# Patient Record
Sex: Female | Born: 1985
Health system: Southern US, Community
[De-identification: ages and names within clinical notes are randomized; demographics above are authoritative.]

## PROBLEM LIST (undated history)

## (undated) DIAGNOSIS — F419 Anxiety disorder, unspecified: Secondary | ICD-10-CM

## (undated) DIAGNOSIS — R600 Localized edema: Secondary | ICD-10-CM

## (undated) DIAGNOSIS — N9489 Other specified conditions associated with female genital organs and menstrual cycle: Secondary | ICD-10-CM

## (undated) DIAGNOSIS — M51379 Other intervertebral disc degeneration, lumbosacral region without mention of lumbar back pain or lower extremity pain: Secondary | ICD-10-CM

## (undated) DIAGNOSIS — I509 Heart failure, unspecified: Secondary | ICD-10-CM

## (undated) DIAGNOSIS — F411 Generalized anxiety disorder: Secondary | ICD-10-CM

## (undated) DIAGNOSIS — G894 Chronic pain syndrome: Secondary | ICD-10-CM

## (undated) DIAGNOSIS — M3507 Sjogren syndrome with central nervous system involvement: Secondary | ICD-10-CM

## (undated) DIAGNOSIS — M5030 Other cervical disc degeneration, unspecified cervical region: Secondary | ICD-10-CM

## (undated) DIAGNOSIS — N939 Abnormal uterine and vaginal bleeding, unspecified: Principal | ICD-10-CM

## (undated) DIAGNOSIS — I499 Cardiac arrhythmia, unspecified: Secondary | ICD-10-CM

## (undated) DIAGNOSIS — F329 Major depressive disorder, single episode, unspecified: Secondary | ICD-10-CM

## (undated) DIAGNOSIS — I774 Celiac artery compression syndrome: Secondary | ICD-10-CM

## (undated) DIAGNOSIS — I739 Peripheral vascular disease, unspecified: Secondary | ICD-10-CM

## (undated) DIAGNOSIS — D500 Iron deficiency anemia secondary to blood loss (chronic): Secondary | ICD-10-CM

## (undated) DIAGNOSIS — Z7901 Long term (current) use of anticoagulants: Secondary | ICD-10-CM

## (undated) DIAGNOSIS — G43019 Migraine without aura, intractable, without status migrainosus: Secondary | ICD-10-CM

## (undated) DIAGNOSIS — Z86718 Personal history of other venous thrombosis and embolism: Secondary | ICD-10-CM

## (undated) DIAGNOSIS — E039 Hypothyroidism, unspecified: Secondary | ICD-10-CM

## (undated) DIAGNOSIS — F32A Depression, unspecified: Secondary | ICD-10-CM

## (undated) HISTORY — DX: Anxiety disorder, unspecified: F41.9

## (undated) HISTORY — PX: CRANIECTOMY SUBOCCIPITAL W/ CERVICAL LAMINECTOMY / CHIARI: SUR327

## (undated) HISTORY — PX: HIATAL HERNIA REPAIR: SHX195

## (undated) HISTORY — PX: CARDIAC CATHETERIZATION: SHX172

## (undated) HISTORY — DX: Depression, unspecified: F32.A

---

## 2011-02-23 DIAGNOSIS — G5603 Carpal tunnel syndrome, bilateral upper limbs: Secondary | ICD-10-CM

## 2011-02-23 HISTORY — DX: Carpal tunnel syndrome, bilateral upper limbs: G56.03

## 2012-04-04 DIAGNOSIS — F330 Major depressive disorder, recurrent, mild: Secondary | ICD-10-CM | POA: Insufficient documentation

## 2012-04-04 HISTORY — DX: Major depressive disorder, recurrent, mild: F33.0

## 2012-04-10 DIAGNOSIS — L720 Epidermal cyst: Secondary | ICD-10-CM

## 2012-04-10 DIAGNOSIS — G43009 Migraine without aura, not intractable, without status migrainosus: Secondary | ICD-10-CM

## 2012-04-10 HISTORY — DX: Migraine without aura, not intractable, without status migrainosus: G43.009

## 2012-04-10 HISTORY — DX: Epidermal cyst: L72.0

## 2013-04-24 DIAGNOSIS — H5710 Ocular pain, unspecified eye: Secondary | ICD-10-CM | POA: Insufficient documentation

## 2013-04-24 DIAGNOSIS — H539 Unspecified visual disturbance: Secondary | ICD-10-CM | POA: Insufficient documentation

## 2013-04-24 DIAGNOSIS — H5213 Myopia, bilateral: Secondary | ICD-10-CM | POA: Insufficient documentation

## 2013-04-24 HISTORY — DX: Myopia, bilateral: H52.13

## 2013-04-24 HISTORY — DX: Ocular pain, unspecified eye: H57.10

## 2013-04-24 HISTORY — DX: Unspecified visual disturbance: H53.9

## 2013-04-28 DIAGNOSIS — G935 Compression of brain: Secondary | ICD-10-CM

## 2013-04-28 HISTORY — DX: Compression of brain: G93.5

## 2013-09-17 DIAGNOSIS — G5610 Other lesions of median nerve, unspecified upper limb: Secondary | ICD-10-CM | POA: Insufficient documentation

## 2013-09-17 DIAGNOSIS — M542 Cervicalgia: Secondary | ICD-10-CM | POA: Insufficient documentation

## 2013-09-17 DIAGNOSIS — M5412 Radiculopathy, cervical region: Secondary | ICD-10-CM | POA: Insufficient documentation

## 2013-09-17 DIAGNOSIS — M5416 Radiculopathy, lumbar region: Secondary | ICD-10-CM | POA: Insufficient documentation

## 2013-09-17 HISTORY — DX: Radiculopathy, cervical region: M54.12

## 2013-09-17 HISTORY — DX: Other lesions of median nerve, unspecified upper limb: G56.10

## 2013-09-17 HISTORY — DX: Radiculopathy, lumbar region: M54.16

## 2013-09-17 HISTORY — DX: Cervicalgia: M54.2

## 2014-05-12 DIAGNOSIS — Z98891 History of uterine scar from previous surgery: Secondary | ICD-10-CM | POA: Insufficient documentation

## 2014-05-12 HISTORY — DX: History of uterine scar from previous surgery: Z98.891

## 2014-05-23 DIAGNOSIS — M47812 Spondylosis without myelopathy or radiculopathy, cervical region: Secondary | ICD-10-CM | POA: Insufficient documentation

## 2014-05-23 DIAGNOSIS — G3184 Mild cognitive impairment, so stated: Secondary | ICD-10-CM | POA: Insufficient documentation

## 2014-05-23 HISTORY — DX: Mild cognitive impairment of uncertain or unknown etiology: G31.84

## 2014-05-23 HISTORY — DX: Spondylosis without myelopathy or radiculopathy, cervical region: M47.812

## 2014-11-05 DIAGNOSIS — R509 Fever, unspecified: Secondary | ICD-10-CM | POA: Insufficient documentation

## 2014-11-05 HISTORY — DX: Fever, unspecified: R50.9

## 2018-02-21 DIAGNOSIS — J209 Acute bronchitis, unspecified: Secondary | ICD-10-CM | POA: Diagnosis not present

## 2018-03-31 DIAGNOSIS — Z20828 Contact with and (suspected) exposure to other viral communicable diseases: Secondary | ICD-10-CM | POA: Diagnosis not present

## 2018-03-31 DIAGNOSIS — R05 Cough: Secondary | ICD-10-CM | POA: Diagnosis not present

## 2018-03-31 DIAGNOSIS — R0602 Shortness of breath: Secondary | ICD-10-CM | POA: Diagnosis not present

## 2018-03-31 DIAGNOSIS — R509 Fever, unspecified: Secondary | ICD-10-CM | POA: Diagnosis not present

## 2018-05-02 DIAGNOSIS — Z79899 Other long term (current) drug therapy: Secondary | ICD-10-CM | POA: Diagnosis not present

## 2018-05-02 DIAGNOSIS — G5603 Carpal tunnel syndrome, bilateral upper limbs: Secondary | ICD-10-CM | POA: Diagnosis not present

## 2018-05-02 DIAGNOSIS — G589 Mononeuropathy, unspecified: Secondary | ICD-10-CM | POA: Diagnosis not present

## 2018-05-02 DIAGNOSIS — G43719 Chronic migraine without aura, intractable, without status migrainosus: Secondary | ICD-10-CM | POA: Diagnosis not present

## 2018-05-02 DIAGNOSIS — M542 Cervicalgia: Secondary | ICD-10-CM | POA: Diagnosis not present

## 2018-05-02 DIAGNOSIS — R202 Paresthesia of skin: Secondary | ICD-10-CM | POA: Diagnosis not present

## 2018-05-16 DIAGNOSIS — Z1159 Encounter for screening for other viral diseases: Secondary | ICD-10-CM | POA: Diagnosis not present

## 2018-06-11 DIAGNOSIS — E039 Hypothyroidism, unspecified: Secondary | ICD-10-CM | POA: Diagnosis not present

## 2018-06-11 DIAGNOSIS — F339 Major depressive disorder, recurrent, unspecified: Secondary | ICD-10-CM | POA: Diagnosis not present

## 2018-06-11 DIAGNOSIS — F419 Anxiety disorder, unspecified: Secondary | ICD-10-CM | POA: Diagnosis not present

## 2018-06-11 DIAGNOSIS — B373 Candidiasis of vulva and vagina: Secondary | ICD-10-CM | POA: Diagnosis not present

## 2018-07-22 DIAGNOSIS — M5412 Radiculopathy, cervical region: Secondary | ICD-10-CM | POA: Diagnosis not present

## 2018-07-22 DIAGNOSIS — G5603 Carpal tunnel syndrome, bilateral upper limbs: Secondary | ICD-10-CM | POA: Diagnosis not present

## 2018-07-22 DIAGNOSIS — G43719 Chronic migraine without aura, intractable, without status migrainosus: Secondary | ICD-10-CM | POA: Diagnosis not present

## 2018-07-22 DIAGNOSIS — M5417 Radiculopathy, lumbosacral region: Secondary | ICD-10-CM | POA: Diagnosis not present

## 2018-09-19 DIAGNOSIS — E039 Hypothyroidism, unspecified: Secondary | ICD-10-CM | POA: Diagnosis not present

## 2018-09-19 DIAGNOSIS — F339 Major depressive disorder, recurrent, unspecified: Secondary | ICD-10-CM | POA: Diagnosis not present

## 2018-09-19 DIAGNOSIS — F419 Anxiety disorder, unspecified: Secondary | ICD-10-CM | POA: Diagnosis not present

## 2019-01-12 DIAGNOSIS — N83202 Unspecified ovarian cyst, left side: Secondary | ICD-10-CM | POA: Diagnosis not present

## 2019-01-12 DIAGNOSIS — R109 Unspecified abdominal pain: Secondary | ICD-10-CM | POA: Diagnosis not present

## 2019-01-12 DIAGNOSIS — Z880 Allergy status to penicillin: Secondary | ICD-10-CM | POA: Diagnosis not present

## 2019-01-12 DIAGNOSIS — R110 Nausea: Secondary | ICD-10-CM | POA: Diagnosis not present

## 2019-01-12 DIAGNOSIS — Z79899 Other long term (current) drug therapy: Secondary | ICD-10-CM | POA: Diagnosis not present

## 2019-01-12 DIAGNOSIS — Z9889 Other specified postprocedural states: Secondary | ICD-10-CM | POA: Diagnosis not present

## 2019-01-12 DIAGNOSIS — N200 Calculus of kidney: Secondary | ICD-10-CM | POA: Diagnosis not present

## 2019-01-12 DIAGNOSIS — R630 Anorexia: Secondary | ICD-10-CM | POA: Diagnosis not present

## 2019-01-12 DIAGNOSIS — R1084 Generalized abdominal pain: Secondary | ICD-10-CM | POA: Diagnosis not present

## 2019-01-12 DIAGNOSIS — R1032 Left lower quadrant pain: Secondary | ICD-10-CM | POA: Diagnosis not present

## 2019-01-15 DIAGNOSIS — E039 Hypothyroidism, unspecified: Secondary | ICD-10-CM | POA: Diagnosis not present

## 2019-01-15 DIAGNOSIS — F339 Major depressive disorder, recurrent, unspecified: Secondary | ICD-10-CM | POA: Diagnosis not present

## 2019-01-15 DIAGNOSIS — G4700 Insomnia, unspecified: Secondary | ICD-10-CM | POA: Diagnosis not present

## 2019-01-15 DIAGNOSIS — F419 Anxiety disorder, unspecified: Secondary | ICD-10-CM | POA: Diagnosis not present

## 2019-02-10 DIAGNOSIS — Z01419 Encounter for gynecological examination (general) (routine) without abnormal findings: Secondary | ICD-10-CM | POA: Diagnosis not present

## 2019-02-10 DIAGNOSIS — Z6822 Body mass index (BMI) 22.0-22.9, adult: Secondary | ICD-10-CM | POA: Diagnosis not present

## 2019-03-10 DIAGNOSIS — N83202 Unspecified ovarian cyst, left side: Secondary | ICD-10-CM | POA: Diagnosis not present

## 2019-04-08 DIAGNOSIS — M792 Neuralgia and neuritis, unspecified: Secondary | ICD-10-CM | POA: Diagnosis not present

## 2019-04-08 DIAGNOSIS — Z79899 Other long term (current) drug therapy: Secondary | ICD-10-CM | POA: Diagnosis not present

## 2019-04-08 DIAGNOSIS — M542 Cervicalgia: Secondary | ICD-10-CM | POA: Diagnosis not present

## 2019-04-08 DIAGNOSIS — M545 Low back pain: Secondary | ICD-10-CM | POA: Diagnosis not present

## 2019-04-08 DIAGNOSIS — G43719 Chronic migraine without aura, intractable, without status migrainosus: Secondary | ICD-10-CM | POA: Diagnosis not present

## 2019-04-24 DIAGNOSIS — R5383 Other fatigue: Secondary | ICD-10-CM | POA: Diagnosis not present

## 2019-04-24 DIAGNOSIS — F339 Major depressive disorder, recurrent, unspecified: Secondary | ICD-10-CM | POA: Diagnosis not present

## 2019-04-24 DIAGNOSIS — F419 Anxiety disorder, unspecified: Secondary | ICD-10-CM | POA: Diagnosis not present

## 2019-04-24 DIAGNOSIS — M3500 Sicca syndrome, unspecified: Secondary | ICD-10-CM | POA: Diagnosis not present

## 2019-04-24 DIAGNOSIS — G4700 Insomnia, unspecified: Secondary | ICD-10-CM | POA: Diagnosis not present

## 2019-04-24 DIAGNOSIS — E039 Hypothyroidism, unspecified: Secondary | ICD-10-CM | POA: Diagnosis not present

## 2019-04-24 DIAGNOSIS — F330 Major depressive disorder, recurrent, mild: Secondary | ICD-10-CM | POA: Diagnosis not present

## 2019-04-30 DIAGNOSIS — R42 Dizziness and giddiness: Secondary | ICD-10-CM | POA: Diagnosis not present

## 2019-05-27 DIAGNOSIS — N938 Other specified abnormal uterine and vaginal bleeding: Secondary | ICD-10-CM | POA: Diagnosis not present

## 2019-05-29 DIAGNOSIS — R109 Unspecified abdominal pain: Secondary | ICD-10-CM | POA: Diagnosis not present

## 2019-05-29 DIAGNOSIS — R1011 Right upper quadrant pain: Secondary | ICD-10-CM | POA: Diagnosis not present

## 2019-05-29 DIAGNOSIS — R319 Hematuria, unspecified: Secondary | ICD-10-CM | POA: Diagnosis not present

## 2019-05-29 DIAGNOSIS — R310 Gross hematuria: Secondary | ICD-10-CM | POA: Diagnosis not present

## 2019-05-29 DIAGNOSIS — R1031 Right lower quadrant pain: Secondary | ICD-10-CM | POA: Diagnosis not present

## 2019-05-29 DIAGNOSIS — Z79899 Other long term (current) drug therapy: Secondary | ICD-10-CM | POA: Diagnosis not present

## 2019-06-05 DIAGNOSIS — Z880 Allergy status to penicillin: Secondary | ICD-10-CM | POA: Diagnosis not present

## 2019-06-05 DIAGNOSIS — Z79899 Other long term (current) drug therapy: Secondary | ICD-10-CM | POA: Diagnosis not present

## 2019-06-05 DIAGNOSIS — R0602 Shortness of breath: Secondary | ICD-10-CM | POA: Diagnosis not present

## 2019-06-05 DIAGNOSIS — R0789 Other chest pain: Secondary | ICD-10-CM | POA: Diagnosis not present

## 2019-06-05 DIAGNOSIS — R000 Tachycardia, unspecified: Secondary | ICD-10-CM | POA: Diagnosis not present

## 2019-06-05 DIAGNOSIS — M79604 Pain in right leg: Secondary | ICD-10-CM | POA: Diagnosis not present

## 2019-06-05 DIAGNOSIS — R079 Chest pain, unspecified: Secondary | ICD-10-CM | POA: Diagnosis not present

## 2019-06-08 DIAGNOSIS — M545 Low back pain: Secondary | ICD-10-CM | POA: Diagnosis not present

## 2019-06-08 DIAGNOSIS — R079 Chest pain, unspecified: Secondary | ICD-10-CM | POA: Diagnosis not present

## 2019-06-25 DIAGNOSIS — I10 Essential (primary) hypertension: Secondary | ICD-10-CM | POA: Diagnosis not present

## 2019-07-01 DIAGNOSIS — K769 Liver disease, unspecified: Secondary | ICD-10-CM | POA: Diagnosis not present

## 2019-07-01 DIAGNOSIS — N3289 Other specified disorders of bladder: Secondary | ICD-10-CM | POA: Diagnosis not present

## 2019-07-01 DIAGNOSIS — K5900 Constipation, unspecified: Secondary | ICD-10-CM | POA: Diagnosis not present

## 2019-07-01 DIAGNOSIS — N888 Other specified noninflammatory disorders of cervix uteri: Secondary | ICD-10-CM | POA: Diagnosis not present

## 2019-07-01 DIAGNOSIS — R310 Gross hematuria: Secondary | ICD-10-CM | POA: Diagnosis not present

## 2019-07-07 DIAGNOSIS — R072 Precordial pain: Secondary | ICD-10-CM | POA: Diagnosis not present

## 2019-07-20 DIAGNOSIS — Z03818 Encounter for observation for suspected exposure to other biological agents ruled out: Secondary | ICD-10-CM | POA: Diagnosis not present

## 2019-07-20 DIAGNOSIS — Z20822 Contact with and (suspected) exposure to covid-19: Secondary | ICD-10-CM | POA: Diagnosis not present

## 2019-07-21 DIAGNOSIS — R0609 Other forms of dyspnea: Secondary | ICD-10-CM | POA: Diagnosis not present

## 2019-07-21 DIAGNOSIS — R05 Cough: Secondary | ICD-10-CM | POA: Diagnosis not present

## 2019-07-22 DIAGNOSIS — J4540 Moderate persistent asthma, uncomplicated: Secondary | ICD-10-CM | POA: Diagnosis not present

## 2019-07-22 DIAGNOSIS — E039 Hypothyroidism, unspecified: Secondary | ICD-10-CM | POA: Diagnosis not present

## 2019-07-29 DIAGNOSIS — R072 Precordial pain: Secondary | ICD-10-CM | POA: Diagnosis not present

## 2019-08-03 ENCOUNTER — Encounter: Payer: Self-pay | Admitting: Medical-Surgical

## 2019-08-03 ENCOUNTER — Ambulatory Visit (INDEPENDENT_AMBULATORY_CARE_PROVIDER_SITE_OTHER): Payer: BC Managed Care – PPO | Admitting: Medical-Surgical

## 2019-08-03 ENCOUNTER — Other Ambulatory Visit: Payer: Self-pay

## 2019-08-03 VITALS — BP 114/82 | HR 81 | Temp 98.2°F | Resp 16 | Ht 60.5 in | Wt 129.8 lb

## 2019-08-03 DIAGNOSIS — R05 Cough: Secondary | ICD-10-CM | POA: Diagnosis not present

## 2019-08-03 DIAGNOSIS — F418 Other specified anxiety disorders: Secondary | ICD-10-CM | POA: Insufficient documentation

## 2019-08-03 DIAGNOSIS — E039 Hypothyroidism, unspecified: Secondary | ICD-10-CM | POA: Insufficient documentation

## 2019-08-03 DIAGNOSIS — R058 Other specified cough: Secondary | ICD-10-CM

## 2019-08-03 DIAGNOSIS — Z7689 Persons encountering health services in other specified circumstances: Secondary | ICD-10-CM

## 2019-08-03 HISTORY — DX: Other specified anxiety disorders: F41.8

## 2019-08-03 HISTORY — DX: Other specified cough: R05.8

## 2019-08-03 NOTE — Progress Notes (Signed)
New Patient Office Visit  Subjective:  Patient ID: Yesenia Harper, female    DOB: 11-May-1985  Age: 34 y.o. MRN: [id number]  CC:  Chief Complaint  Patient presents with  . New Patient (Initial Visit)    HPI Yesenia Harper is a 34 year old female who presents to establish care. She has had quite a journey over the past year with multiple issues. Most recently she has been under the care of Cardiology, GI, Rheumatology, Neurology, and OB/GYN. Notes that she was found to have hematuria which prompted a CT Urogram. The urogram identified a lesion on her liver as well as ovarian cysts. She has been experiencing heart flutters, SOB, intermittent BLE edema, unexplained weight gain. She also has been struggling with fatigue, hair loss, easy bruising, knots in her legs, cold intolerance, dry eyes, and dry mouth. She has been evaluated and was negative for lupus. She was told she has fibromyalgia but doesn't feel that this is the cause of her symptoms. Has been on levothyroxine for approximately 1.5 years and notes that she has never felt an improvement in her symptoms. Recent thyroid labs normal for both TSH and free T4. Recently had an upper respiratory illness that lasted for 2 weeks. She still has a dry cough and reports her previous PCP told her she has asthma then prescribed a round of Prednisone and 2 inhalers.   Migraines- uses Nurtec as needed with good results and minimal side effects.  Anxiety/depression- taking Wellbutrin, tolerating well without side effects. Has tried Lexapro and other SSRIs in the past with too many side effects. Has Xanax prescribed TID as needed but only takes it at night. Has done counseling in 2013-04-29 when her nephew passed away but is not currently doing therapy.  History reviewed. No pertinent past medical history.  History reviewed. No pertinent surgical history.  History reviewed. No pertinent family history.  Social History   Socioeconomic History  . Marital  status: Married    Spouse name: Not on file  . Number of children: Not on file  . Years of education: Not on file  . Highest education level: Not on file  Occupational History  . Not on file  Tobacco Use  . Smoking status: Not on file  Substance and Sexual Activity  . Alcohol use: Not Currently  . Drug use: Never  . Sexual activity: Not on file  Other Topics Concern  . Not on file  Social History Narrative  . Not on file   Social Determinants of Health   Financial Resource Strain:   . Difficulty of Paying Living Expenses:   Food Insecurity:   . Worried About Programme researcher, broadcasting/film/video in the Last Year:   . Barista in the Last Year:   Transportation Needs:   . Freight forwarder (Medical):   Marland Kitchen Lack of Transportation (Non-Medical):   Physical Activity:   . Days of Exercise per Week:   . Minutes of Exercise per Session:   Stress:   . Feeling of Stress :   Social Connections:   . Frequency of Communication with Friends and Family:   . Frequency of Social Gatherings with Friends and Family:   . Attends Religious Services:   . Active Member of Clubs or Organizations:   . Attends Banker Meetings:   Marland Kitchen Marital Status:   Intimate Partner Violence:   . Fear of Current or Ex-Partner:   . Emotionally Abused:   Marland Kitchen Physically Abused:   . Sexually  Abused:     ROS Review of Systems  Constitutional: Positive for fatigue. Negative for chills and fever.  Eyes: Positive for photophobia.       Dry eyes  Respiratory: Positive for cough (dry), chest tightness and shortness of breath. Negative for wheezing.   Cardiovascular: Positive for chest pain, palpitations and leg swelling (intermittent).  Gastrointestinal: Positive for abdominal pain, constipation and nausea. Negative for blood in stool, diarrhea and vomiting.  Endocrine: Positive for cold intolerance.  Genitourinary: Negative for dysuria, frequency, urgency, vaginal bleeding and vaginal discharge.        Sometimes emptying bladder is difficult  Musculoskeletal: Positive for arthralgias (generalized), myalgias (generalized) and neck pain.  Allergic/Immunologic: Negative for environmental allergies and food allergies.  Neurological: Positive for dizziness, light-headedness and headaches (migraines without aura).  Hematological: Bruises/bleeds easily.  Psychiatric/Behavioral: Positive for dysphoric mood. Negative for self-injury, sleep disturbance and suicidal ideas. The patient is nervous/anxious.     Objective:   Today's Vitals: BP 114/82   Pulse 81   Temp 98.2 F (36.8 C) (Oral)   Resp 16   Ht 5' 0.5" (1.537 m)   Wt 129 lb 12.8 oz (58.9 kg)   BMI 24.93 kg/m   Physical Exam Vitals reviewed.  Constitutional:      General: She is not in acute distress.    Appearance: Normal appearance.  HENT:     Head: Normocephalic and atraumatic.  Cardiovascular:     Rate and Rhythm: Normal rate and regular rhythm.     Pulses: Normal pulses.     Heart sounds: Normal heart sounds. No murmur heard.  No friction rub. No gallop.   Pulmonary:     Effort: Pulmonary effort is normal. No respiratory distress.     Breath sounds: Normal breath sounds. No wheezing.  Skin:    General: Skin is warm and dry.  Neurological:     Mental Status: She is alert and oriented to person, place, and time.  Psychiatric:        Mood and Affect: Mood normal.        Behavior: Behavior normal.        Thought Content: Thought content normal.        Judgment: Judgment normal.     Assessment & Plan:   1. Encounter to establish care Reviewed available records and discussed health concerns. Currently under the care of multiple specialists for her various symptoms and concerns.   2. Hypothyroidism, unspecified type Most recent numbers for TSH and free T4 within normal limits but she is still symptomatic. Given that she reports she has never felt a reduction in symptoms since starting the levothyroxine, referring to  endocrinology for further evaluation. - Ambulatory referral to Endocrinology  3. Dry cough Patient does not feel that her prior PCP's diagnosis of asthma is correct. Discussed possible causes of a chronic dry cough. If patient would like to return for spirometry testing, this would be helpful in ruling out asthma. She was encouraged to schedule this at her convenience.  4. Anxiety with depression Continue Wellbutrin as prescribed. Continue sparing use of Xanax.  Consider panic attacks as possible cause of chest tightness, shortness of breath, abdominal pain, nausea, dizziness, and palpitations. Advised patient to monitor her mental status the next time one of these episodes occurs to see if she is feeling anxious or excessively stressed. May benefit from counseling to help manage her anxiety symptoms. Should she like, I will be glad to enter a referral to get her  connected to behavioral health.  Outpatient Encounter Medications as of 08/03/2019  Medication Sig  . chlorpheniramine-HYDROcodone (TUSSIONEX) 10-8 MG/5ML SUER Take by mouth.  . dicyclomine (BENTYL) 10 MG capsule One po AC  . estradiol (ESTRACE) 1 MG tablet Take by mouth.  Marland Kitchen albuterol (VENTOLIN HFA) 108 (90 Base) MCG/ACT inhaler Inhale into the lungs as needed.  . ALPRAZolam (XANAX) 0.5 MG tablet Take 0.5 mg by mouth 3 (three) times daily as needed.  Marland Kitchen buPROPion (WELLBUTRIN XL) 150 MG 24 hr tablet Take 450 mg by mouth daily.  . cyanocobalamin (,VITAMIN B-12,) 1000 MCG/ML injection Inject 1,000 mg into the muscle every 30 (thirty) days.  Marland Kitchen etonogestrel (NEXPLANON) 68 MG IMPL implant Inject into the skin.  Marland Kitchen levothyroxine (SYNTHROID) 75 MCG tablet Take 75 mcg by mouth daily.  Marland Kitchen LINZESS 290 MCG CAPS capsule Take 1 capsule by mouth.  . metoprolol succinate (TOPROL-XL) 25 MG 24 hr tablet Take 1 tablet by mouth as needed.  . Multiple Vitamins-Minerals (WOMENS MULTI VITAMIN & MINERAL) TABS Take 1 tablet by mouth daily.  . NURTEC 75 MG TBDP  Take 1 tablet by mouth daily as needed.  . predniSONE (DELTASONE) 20 MG tablet Take 20 mg by mouth daily.  . promethazine (PHENERGAN) 25 MG tablet Take 25 mg by mouth every 6 (six) hours as needed.  . topiramate (TOPAMAX) 200 MG tablet Take 200 mg by mouth 2 (two) times daily.  . [DISCONTINUED] TRULANCE 3 MG TABS TAKE ONE TABLET (3 MG DOSE) BY MOUTH DAILY.   No facility-administered encounter medications on file as of 08/03/2019.    Follow-up: Return in about 3 months (around 11/03/2019) for anxiety, chronic health concern follow up.   Thayer Ohm, DNP, APRN, FNP-BC [facility] and [facility]

## 2019-08-12 ENCOUNTER — Ambulatory Visit: Payer: BC Managed Care – PPO | Admitting: Internal Medicine

## 2019-08-17 ENCOUNTER — Encounter: Payer: Self-pay | Admitting: Medical-Surgical

## 2019-08-17 MED ORDER — FLUCONAZOLE 150 MG PO TABS: 150.0000 mg | ORAL_TABLET | Freq: Once | ORAL | 0 refills | Status: AC

## 2019-08-24 ENCOUNTER — Ambulatory Visit (INDEPENDENT_AMBULATORY_CARE_PROVIDER_SITE_OTHER): Payer: BC Managed Care – PPO

## 2019-08-24 ENCOUNTER — Telehealth: Payer: BC Managed Care – PPO | Admitting: Family

## 2019-08-24 ENCOUNTER — Encounter: Payer: Self-pay | Admitting: Medical-Surgical

## 2019-08-24 ENCOUNTER — Other Ambulatory Visit: Payer: Self-pay

## 2019-08-24 ENCOUNTER — Ambulatory Visit: Payer: BC Managed Care – PPO | Admitting: Medical-Surgical

## 2019-08-24 VITALS — BP 149/101 | HR 109 | Temp 97.9°F | Ht 60.5 in | Wt 126.3 lb

## 2019-08-24 DIAGNOSIS — R1031 Right lower quadrant pain: Secondary | ICD-10-CM

## 2019-08-24 DIAGNOSIS — R300 Dysuria: Secondary | ICD-10-CM | POA: Diagnosis not present

## 2019-08-24 DIAGNOSIS — M545 Low back pain, unspecified: Secondary | ICD-10-CM

## 2019-08-24 DIAGNOSIS — R110 Nausea: Secondary | ICD-10-CM

## 2019-08-24 DIAGNOSIS — R319 Hematuria, unspecified: Secondary | ICD-10-CM | POA: Diagnosis not present

## 2019-08-24 DIAGNOSIS — R109 Unspecified abdominal pain: Secondary | ICD-10-CM

## 2019-08-24 LAB — POCT URINALYSIS DIP (CLINITEK)
Bilirubin, UA: NEGATIVE
Glucose, UA: NEGATIVE mg/dL
Ketones, POC UA: NEGATIVE mg/dL
Leukocytes, UA: NEGATIVE
Nitrite, UA: NEGATIVE
POC PROTEIN,UA: NEGATIVE
Spec Grav, UA: 1.015 (ref 1.010–1.025)
Urobilinogen, UA: 0.2 E.U./dL
pH, UA: 5.5 (ref 5.0–8.0)

## 2019-08-24 MED ORDER — ONDANSETRON 8 MG PO TBDP: 8.0000 mg | ORAL_TABLET | Freq: Three times a day (TID) | ORAL | 0 refills | Status: DC | PRN

## 2019-08-24 MED ORDER — HYDROCODONE-ACETAMINOPHEN 5-325 MG PO TABS: 1.0000 | ORAL_TABLET | Freq: Three times a day (TID) | ORAL | 0 refills | Status: DC | PRN

## 2019-08-24 MED ORDER — NAPROXEN 500 MG PO TABS
500.0000 mg | ORAL_TABLET | Freq: Two times a day (BID) | ORAL | 0 refills | Status: DC
Start: 2019-08-24 — End: 2019-11-24

## 2019-08-24 MED ORDER — ONDANSETRON 4 MG PO TBDP
4.0000 mg | ORAL_TABLET | Freq: Once | ORAL | Status: AC
  Administered 2019-08-24: 4 mg via ORAL

## 2019-08-24 MED ORDER — BACLOFEN 10 MG PO TABS
10.0000 mg | ORAL_TABLET | Freq: Three times a day (TID) | ORAL | 0 refills | Status: DC
Start: 2019-08-24 — End: 2019-11-24

## 2019-08-24 NOTE — Progress Notes (Signed)

## 2019-08-24 NOTE — Progress Notes (Signed)
Subjective:    CC: Back/flank pain, dysuria  HPI: Pleasant 34 year old female presenting today with complaints of severe back/flank pain that started yesterday afternoon.  Pain is described as colicky, coming and going but never fully resolving.  Currently rating pain at 6/10.  Also notes that she has had some dysuria recently, described as discomfort with mild burning.  Noted urine had visible blood with some cloudiness yesterday afternoon and this morning.  She has had significant fatigue, nausea, and chills.  No fever, vomiting, or constipation/diarrhea.  Tried taking ibuprofen/Tylenol with no relief.  Positive history of gross hematuria, worked up by urology with no cause identified.  Was supposed to have a cystoscopy according to her notes from June but this was never scheduled.  On CT in January, noted to have several small nonobstructing kidney stones.  I reviewed the past medical history, family history, social history, surgical history, and allergies today and no changes were needed.  Please see the problem list section below in epic for further details.  Past Medical History: Past Medical History:  Diagnosis Date  . Anxiety   . Delivery of pregnancy by cesarean section   . Depression    Past Surgical History: Past Surgical History:  Procedure Laterality Date  . CESAREAN SECTION     Social History: Social History   Socioeconomic History  . Marital status: Married    Spouse name: Not on file  . Number of children: Not on file  . Years of education: Not on file  . Highest education level: Not on file  Occupational History  . Not on file  Tobacco Use  . Smoking status: Never Smoker  . Smokeless tobacco: Never Used  Substance and Sexual Activity  . Alcohol use: Not Currently  . Drug use: Never  . Sexual activity: Yes    Partners: Male    Birth control/protection: I.U.D.  Other Topics Concern  . Not on file  Social History Narrative  . Not on file   Social  Determinants of Health   Financial Resource Strain:   . Difficulty of Paying Living Expenses:   Food Insecurity:   . Worried About Programme researcher, broadcasting/film/video in the Last Year:   . Barista in the Last Year:   Transportation Needs:   . Freight forwarder (Medical):   Marland Kitchen Lack of Transportation (Non-Medical):   Physical Activity:   . Days of Exercise per Week:   . Minutes of Exercise per Session:   Stress:   . Feeling of Stress :   Social Connections:   . Frequency of Communication with Friends and Family:   . Frequency of Social Gatherings with Friends and Family:   . Attends Religious Services:   . Active Member of Clubs or Organizations:   . Attends Banker Meetings:   Marland Kitchen Marital Status:    Family History: Family History  Problem Relation Age of Onset  . Hypertension Mother   . Diabetes Mother   . Hypertension Father   . Diabetes Father   . Diabetes Maternal Grandmother   . Hypertension Maternal Grandmother   . Stroke Maternal Grandmother   . Diabetes Maternal Grandfather   . Hypertension Maternal Grandfather   . Stroke Maternal Grandfather   . Diabetes Paternal Grandmother   . Hypertension Paternal Grandmother   . Stroke Paternal Grandmother   . Diabetes Paternal Grandfather   . Hypertension Paternal Grandfather   . Stroke Paternal Grandfather    Allergies: Allergies  Allergen  Reactions  . Penicillins Itching    Patient feels itching in mouth.   Medications: See med rec.  Review of Systems: See HPI for pertinent positives and negatives.   Objective:    General: Well Developed, well nourished.  Pale, ill-appearing Neuro: Alert and oriented x3.  HEENT: Normocephalic, atraumatic.  Skin: Warm and dry. Cardiac: Regular rate and rhythm, no murmurs rubs or gallops, no lower extremity edema.  Respiratory: Clear to auscultation bilaterally. Not using accessory muscles, speaking in full sentences. Abdomen: Soft, nondistended.  Tenderness to right  upper and lower quadrants.  Left upper and lower quadrants nontender.  Bowel sounds positive x4.  No HSM appreciated. MSK: Pain noted to be in right lower thoracic/upper lumbar/right flank area but not reproducible on palpation.   Impression and Recommendations:    1. Dysuria POCT UA negative for nitrates, leukocytes, and protein.  Positive for moderate blood. - POCT URINALYSIS DIP (CLINITEK)  2. Nausea Since nausea is severe, giving Zofran ODT 4 mg in office.  Sending Zofran 8 mg ODT to use every 8 hours as needed at home. - ondansetron (ZOFRAN-ODT) disintegrating tablet 4 mg  3. Right lower quadrant abdominal pain/flank pain Presentation suspicious for kidney stone especially in the presence of known nonobstructing stones found in January.  Urine strainer provided to strain all urine.  Stat CT renal stone study.  Sending in Norco 1 tab every 8 hours as needed.  Okay to use ibuprofen 600 mg every 6 hours as needed.  Push p.o. fluids. - CT RENAL STONE STUDY; Future  Return if symptoms worsen or fail to improve.  ___________________________________________ Thayer Ohm, DNP, APRN, FNP-BC Primary Care and Sports Medicine [facility]

## 2019-08-27 ENCOUNTER — Ambulatory Visit: Payer: BC Managed Care – PPO | Admitting: Medical-Surgical

## 2019-08-27 ENCOUNTER — Encounter: Payer: Self-pay | Admitting: Medical-Surgical

## 2019-08-27 DIAGNOSIS — R110 Nausea: Secondary | ICD-10-CM

## 2019-08-27 DIAGNOSIS — E538 Deficiency of other specified B group vitamins: Secondary | ICD-10-CM

## 2019-08-27 DIAGNOSIS — M545 Low back pain, unspecified: Secondary | ICD-10-CM

## 2019-08-27 DIAGNOSIS — R310 Gross hematuria: Secondary | ICD-10-CM

## 2019-08-28 ENCOUNTER — Other Ambulatory Visit: Payer: Self-pay | Admitting: Medical-Surgical

## 2019-08-28 ENCOUNTER — Telehealth: Payer: Self-pay

## 2019-08-28 DIAGNOSIS — R310 Gross hematuria: Secondary | ICD-10-CM

## 2019-08-28 DIAGNOSIS — M545 Low back pain, unspecified: Secondary | ICD-10-CM

## 2019-08-28 DIAGNOSIS — R110 Nausea: Secondary | ICD-10-CM

## 2019-08-28 NOTE — Telephone Encounter (Signed)
Please see portal message communication between Joy and pt dated 08/27/2019.0

## 2019-08-28 NOTE — Telephone Encounter (Signed)
Urologist referral pended for provider review.

## 2019-08-28 NOTE — Telephone Encounter (Signed)
LVMTRC (1st attempt)    Pt as scheduled for an OV on 08/27/2019 but did not show for the appt. Joy wants [place] to get in touch with the pt regarding labwork. She stated that since the pt is stating she has ongoing nausea that she wants to do a repeat CMP and CBC for her. Joy wants to know if the pt would be able to come to our lab today or if there is a lab she has a little bit more access to so that the blood can be drawn ASAP.

## 2019-08-28 NOTE — Telephone Encounter (Signed)
Pt responded to provider via MyChart regarding missed appt, lab order and Urology referral. Pending a response from provider to complete task.

## 2019-08-31 ENCOUNTER — Other Ambulatory Visit: Payer: Self-pay | Admitting: Medical-Surgical

## 2019-08-31 DIAGNOSIS — R310 Gross hematuria: Secondary | ICD-10-CM | POA: Diagnosis not present

## 2019-08-31 DIAGNOSIS — E538 Deficiency of other specified B group vitamins: Secondary | ICD-10-CM | POA: Diagnosis not present

## 2019-08-31 DIAGNOSIS — M545 Low back pain: Secondary | ICD-10-CM | POA: Diagnosis not present

## 2019-08-31 DIAGNOSIS — R110 Nausea: Secondary | ICD-10-CM | POA: Diagnosis not present

## 2019-08-31 NOTE — Addendum Note (Signed)
Addended byChristen Butter on: 08/31/2019 01:20 PM   Modules accepted: Orders

## 2019-09-01 ENCOUNTER — Ambulatory Visit: Payer: BC Managed Care – PPO | Admitting: Internal Medicine

## 2019-09-01 LAB — COMPREHENSIVE METABOLIC PANEL
ALT: 21 IU/L (ref 0–32)
AST: 13 IU/L (ref 0–40)
Albumin/Globulin Ratio: 2.3 — ABNORMAL HIGH (ref 1.2–2.2)
Albumin: 4.4 g/dL (ref 3.8–4.8)
Alkaline Phosphatase: 48 IU/L (ref 48–121)
BUN/Creatinine Ratio: 18 (ref 9–23)
BUN: 12 mg/dL (ref 6–20)
Bilirubin Total: <0.2 mg/dL (ref 0.0–1.2)
CO2: 22 mmol/L (ref 20–29)
Calcium: 8.7 mg/dL (ref 8.7–10.2)
Chloride: 109 mmol/L — ABNORMAL HIGH (ref 96–106)
Creatinine, Ser: 0.68 mg/dL (ref 0.57–1.00)
GFR calc Af Amer: 134 mL/min/{1.73_m2} (ref >59)
GFR calc non Af Amer: 116 mL/min/{1.73_m2} (ref >59)
Globulin, Total: 1.9 g/dL (ref 1.5–4.5)
Glucose: 84 mg/dL (ref 65–99)
Potassium: 3.8 mmol/L (ref 3.5–5.2)
Sodium: 140 mmol/L (ref 134–144)
Total Protein: 6.3 g/dL (ref 6.0–8.5)

## 2019-09-01 LAB — CBC WITH DIFFERENTIAL/PLATELET
Basophils Absolute: 0 10*3/uL (ref 0.0–0.2)
Basos: 1 %
EOS (ABSOLUTE): 0.1 10*3/uL (ref 0.0–0.4)
Eos: 1 %
Hematocrit: 35.9 % (ref 34.0–46.6)
Hemoglobin: 12.1 g/dL (ref 11.1–15.9)
Immature Grans (Abs): 0 10*3/uL (ref 0.0–0.1)
Immature Granulocytes: 0 %
Lymphocytes Absolute: 2.8 10*3/uL (ref 0.7–3.1)
Lymphs: 44 %
MCH: 31.8 pg (ref 26.6–33.0)
MCHC: 33.7 g/dL (ref 31.5–35.7)
MCV: 94 fL (ref 79–97)
Monocytes Absolute: 0.5 10*3/uL (ref 0.1–0.9)
Monocytes: 9 %
Neutrophils Absolute: 2.8 10*3/uL (ref 1.4–7.0)
Neutrophils: 45 %
Platelets: 300 10*3/uL (ref 150–450)
RBC: 3.81 x10E6/uL (ref 3.77–5.28)
RDW: 11.8 % (ref 11.7–15.4)
WBC: 6.3 10*3/uL (ref 3.4–10.8)

## 2019-09-01 LAB — VITAMIN B12: Vitamin B-12: 629 pg/mL (ref 232–1245)

## 2019-09-02 ENCOUNTER — Encounter: Payer: Self-pay | Admitting: Medical-Surgical

## 2019-11-06 ENCOUNTER — Ambulatory Visit: Payer: BC Managed Care – PPO | Admitting: Medical-Surgical

## 2019-11-06 DIAGNOSIS — R072 Precordial pain: Secondary | ICD-10-CM | POA: Diagnosis not present

## 2019-11-06 DIAGNOSIS — Z23 Encounter for immunization: Secondary | ICD-10-CM | POA: Diagnosis not present

## 2019-11-11 ENCOUNTER — Encounter: Payer: Self-pay | Admitting: Medical-Surgical

## 2019-11-12 NOTE — Telephone Encounter (Signed)
Please change patient's appt to in office. Thanks!

## 2019-11-13 ENCOUNTER — Encounter: Payer: Self-pay | Admitting: Medical-Surgical

## 2019-11-13 ENCOUNTER — Encounter (HOSPITAL_COMMUNITY): Payer: Self-pay | Admitting: Emergency Medicine

## 2019-11-13 ENCOUNTER — Emergency Department (HOSPITAL_COMMUNITY): Payer: BC Managed Care – PPO

## 2019-11-13 ENCOUNTER — Telehealth (INDEPENDENT_AMBULATORY_CARE_PROVIDER_SITE_OTHER): Payer: BC Managed Care – PPO | Admitting: Medical-Surgical

## 2019-11-13 ENCOUNTER — Emergency Department (HOSPITAL_COMMUNITY)
Admission: EM | Admit: 2019-11-13 | Discharge: 2019-11-13 | Disposition: A | Discharge status: Home / Self Care | Payer: BC Managed Care – PPO | Attending: Emergency Medicine | Admitting: Emergency Medicine

## 2019-11-13 ENCOUNTER — Other Ambulatory Visit: Payer: Self-pay

## 2019-11-13 DIAGNOSIS — R0602 Shortness of breath: Secondary | ICD-10-CM | POA: Insufficient documentation

## 2019-11-13 DIAGNOSIS — I499 Cardiac arrhythmia, unspecified: Secondary | ICD-10-CM | POA: Diagnosis not present

## 2019-11-13 DIAGNOSIS — Z5321 Procedure and treatment not carried out due to patient leaving prior to being seen by health care provider: Secondary | ICD-10-CM | POA: Diagnosis not present

## 2019-11-13 DIAGNOSIS — R059 Cough, unspecified: Secondary | ICD-10-CM

## 2019-11-13 DIAGNOSIS — R0789 Other chest pain: Secondary | ICD-10-CM | POA: Diagnosis not present

## 2019-11-13 DIAGNOSIS — Z79899 Other long term (current) drug therapy: Secondary | ICD-10-CM | POA: Diagnosis not present

## 2019-11-13 DIAGNOSIS — R002 Palpitations: Secondary | ICD-10-CM

## 2019-11-13 DIAGNOSIS — Z20822 Contact with and (suspected) exposure to covid-19: Secondary | ICD-10-CM | POA: Diagnosis not present

## 2019-11-13 DIAGNOSIS — R0600 Dyspnea, unspecified: Secondary | ICD-10-CM | POA: Diagnosis not present

## 2019-11-13 DIAGNOSIS — R079 Chest pain, unspecified: Secondary | ICD-10-CM | POA: Insufficient documentation

## 2019-11-13 DIAGNOSIS — Z880 Allergy status to penicillin: Secondary | ICD-10-CM | POA: Diagnosis not present

## 2019-11-13 HISTORY — DX: Hypothyroidism, unspecified: E03.9

## 2019-11-13 NOTE — ED Triage Notes (Signed)
Patient complains of shortness of breath and cough for the last few days. Reports sharp pain on left side of chest that increases with inspiration. Patient alert and oriented and in no apparent distress at this time.

## 2019-11-13 NOTE — Telephone Encounter (Signed)
Patient will call back to reschedule at this time.

## 2019-11-13 NOTE — Progress Notes (Signed)
Virtual Visit via Video Note  I connected with Yesenia Harper on 11/13/19 at  4:30 PM EDT by a video enabled telemedicine application and verified that I am speaking with the correct person using two identifiers.   I discussed the limitations of evaluation and management by telemedicine and the availability of in person appointments. The patient expressed understanding and agreed to proceed.  Patient location: home Provider locations: office  Subjective:    CC: follow up, SOB/chest heavy/palpitations  HPI: Pleasant 34 year old female presenting via MyChart video visit with reports of shortness of breath, chest heaviness, and palpitations.  She has been evaluated by cardiology but has not followed up with them after her most recent test.  Her long-term Holter monitor showed a an underlying rhythm of normal sinus rhythm and did not find any concerning ectopies.  She did have a stress echo this past Friday, no abnormal findings.  Notes that after her stress echo on Friday she began to feel really bad.  She called cardiology was told to go to the ED.  Unfortunately this is very costly for her and has never been fruitful.  She began to have shortness of breath and chest heaviness along the top of her chest while lying down on Friday evening.  On Saturday it got a little bit worse and then even worse on Sunday.  Now she has shortness of breath and chest heaviness all the time.  She is also developed a new nonproductive cough.  She is a non-smoker and denies any reflux symptoms no fever, chills, body aches, or signs of illness.  Feels as if something is sitting on her chest.  She does have a personal history of a DVT many years ago in her leg.  Also notes her paternal grandmother had a history of blood clots and died from an aneurysm.  She has never undergone any pulmonary function testing and her last chest x-ray was around 6 months ago.   Past medical history, Surgical history, Family history not  pertinant except as noted below, Social history, Allergies, and medications have been entered into the medical record, reviewed, and corrections made.   Review of Systems: See HPI for pertinent positives and negatives.   Objective:    General: Speaking clearly in complete sentences without any shortness of breath.  Alert, visibly fatigued, and oriented x3.  Normal judgment. No apparent acute distress.  Impression and Recommendations:    1. Shortness of breath/chest heaviness/cough/palpitations Concern for possible PE with personal history of DVT.  Checking CBC, CMP, and TSH today, orders entered for [facility] per patient request.  Checking D-dimer stat.  Would like to get a chest x-ray if possible.  I will order this external but we will need to know where to fax the order.  Patient is not answering her phone at this time and neither is her husband.  Unable to leave a voicemail on either phone. - CBC - TSH - D-dimer, quantitative (not at [facility]) - DG Chest 2 View; Future - Comprehensive Metabolic Panel (CMET)  Addendum: Able to connect with patient via MyChart message. With the difficulty of finding lab hours and an available imaging site on the weekend, recommend patient go to the ED. She wants to avoid [facility] but is willing to go to [facility]. Reports she is en route there as of 1730.  I discussed the assessment and treatment plan with the patient. The patient was provided an opportunity to ask questions and all were answered. The patient agreed with  the plan and demonstrated an understanding of the instructions.   The patient was advised to call back or seek an in-person evaluation if the symptoms worsen or if the condition fails to improve as anticipated.  30 minutes of non-face-to-face time was provided during this encounter.  Return if symptoms worsen or fail to improve.  Thayer Ohm, DNP, APRN, FNP-BC [facility] and Sports Medicine

## 2019-11-13 NOTE — ED Notes (Signed)
Patient stated that she was leaving.

## 2019-11-17 ENCOUNTER — Other Ambulatory Visit: Payer: Self-pay | Admitting: Medical-Surgical

## 2019-11-17 DIAGNOSIS — R0602 Shortness of breath: Secondary | ICD-10-CM

## 2019-11-17 DIAGNOSIS — R002 Palpitations: Secondary | ICD-10-CM

## 2019-11-17 DIAGNOSIS — R059 Cough, unspecified: Secondary | ICD-10-CM

## 2019-11-17 DIAGNOSIS — R0789 Other chest pain: Secondary | ICD-10-CM

## 2019-11-18 DIAGNOSIS — M62830 Muscle spasm of back: Secondary | ICD-10-CM | POA: Diagnosis not present

## 2019-11-18 DIAGNOSIS — G5603 Carpal tunnel syndrome, bilateral upper limbs: Secondary | ICD-10-CM | POA: Diagnosis not present

## 2019-11-18 DIAGNOSIS — G43719 Chronic migraine without aura, intractable, without status migrainosus: Secondary | ICD-10-CM | POA: Diagnosis not present

## 2019-11-18 DIAGNOSIS — M5416 Radiculopathy, lumbar region: Secondary | ICD-10-CM | POA: Diagnosis not present

## 2019-11-19 DIAGNOSIS — F32A Depression, unspecified: Secondary | ICD-10-CM | POA: Insufficient documentation

## 2019-11-19 DIAGNOSIS — F419 Anxiety disorder, unspecified: Secondary | ICD-10-CM | POA: Insufficient documentation

## 2019-11-20 ENCOUNTER — Encounter: Payer: Self-pay | Admitting: Medical-Surgical

## 2019-11-21 ENCOUNTER — Encounter: Payer: Self-pay | Admitting: Medical-Surgical

## 2019-11-23 NOTE — Progress Notes (Signed)
Cardiology Office Note:    Date:  11/24/2019   ID:  [name], DOB [birth year], MRN [id number]  PCP:  Christen Butter, NP  Cardiologist:  Norman Herrlich, MD   Referring MD: Christen Butter, NP  ASSESSMENT:    1. Shortness of breath   2. Palpitations   3. Chest pain of uncertain etiology   4. Heart murmur    PLAN:    In order of problems listed above:  1. Is a very complicated visit with multiple cardiovascular complaints and a multitude of cardiac testing has been performed.  For further evaluation I advised 2. Echocardiogram with pulmonary ejection murmur, stress echo images are not optimal for evaluation of pulmonic stenosis or assessment of pulmonary artery pressure and right heart function. 3. Duplex lower extremities with remote history of DVT edema calf pain although would be quite unlikely with normal D-dimers 4. At this time I do not think she requires a repeat CTA for pulmonary embolism 5. Recheck 14-day monitor she thinks that her arrhythmia was missed 6. Her chest pain is none cardiac she has already had an ischemia evaluation by stress echo and I would not consider cardiac CTA at this time 7. If the studies are reassuring evaluate for underlying asthma especially with her cough and shortness of breath.  PFTs already scheduled  Next appointment 6 weeks   Medication Adjustments/Labs and Tests Ordered: Current medicines are reviewed at length with the patient today.  Concerns regarding medicines are outlined above.  Orders Placed This Encounter  Procedures  . LONG TERM MONITOR (3-14 DAYS)  . ECHOCARDIOGRAM COMPLETE  . VAS [place] LOWER EXTREMITY VENOUS (DVT)   No orders of the defined types were placed in this encounter.    Chief Complaint  Patient presents with  . Chest Pain    History of Present Illness:    Yesenia Harper is a 34 y.o. female who is being seen today for the evaluation of cardiovascular symptoms of shortness of breath palpitation and chest pain at the  request of Christen Butter, NP.  Chart review review: I reviewed her PCP office note 11/13/2019 She had a stress echocardiogram performed 11/06/2019 ,she achieved target heart rate normal exercise capacity and normal stress echo with no evidence of ischemia. She had a 14-day event monitor performed 07/29/2019 showing sinus rhythm with no clinical arrhythmia.  She had triggered events that were associated with sinus rhythm and sinus tachycardia. She was seen [facility] ED 11/13/2019 with chest discomfort.  Testing included CBC normal hemoglobin 12.6 potassium mildly decreased 3.4 creatinine 0.8 GFR 98 cc high-sensitivity troponin was normal D-dimer was normal COVID-19 testing normal chest x-ray normal.  She was discharged from the emergency room.  Her EKG was described as sinus rhythm and normal.  She was seen at [facility] cardiology 07/07/2019 for chest pain it was described as sharp substernal with shortness of breath and palpitation occurring with and without exertion and lasting for hours.  Her physical examination was unremarkable and she has advise further evaluation including a stress echocardiogram.  She presented the emergency room 11/13/2019 stat EKG sinus rhythm normal onset left without being seen x-ray was also normal.  Further evaluation she had duplex lower extremities May negative for DVT and had a CT angiogram which showed no evidence of pulmonary pathology pulmonary embolism and no aortic atherosclerosis or coronary artery calcification.  Since the time of her stress echo her symptoms have worsened.  She has multiple cardiovascular symptoms including edema and weight gain shortness of  breath with exertion she relates a history of a decade ago of having a duplex of the legs with infrapopliteal thrombus left lower extremity which was not treated with anticoagulation.  She has chest pain that is nearly constant its been present since July and it is sharp in nature left chest radiates towards  the shoulder and into the back worse with a deep breath not positional not alleviated with any posture.  Its not exertional angina.  She also is aware of her heart racing with physical effort and does not feel that the monitor captured her symptoms.  She has had no syncope.  She has no history of congenital or rheumatic heart disease.  She is scheduled for pulmonary function test she off she also coughs.  She has no history of asthma there is no been no chest wall trauma.  Her husband and EMT [facility] is present during the evaluation decision making  Past Medical History:  Diagnosis Date  . Anxiety   . Anxiety with depression 08/03/2019  . Delivery of pregnancy by cesarean section   . Depression   . Hypothyroidism     Past Surgical History:  Procedure Laterality Date  . CESAREAN SECTION      Current Medications: Current Meds  Medication Sig  . ALPRAZolam (XANAX) 0.5 MG tablet Take 0.5 mg by mouth 3 (three) times daily as needed.  Marland Kitchen buPROPion (WELLBUTRIN XL) 150 MG 24 hr tablet Take 450 mg by mouth daily.  . cyanocobalamin (,VITAMIN B-12,) 1000 MCG/ML injection Inject 1,000 mg into the muscle every 30 (thirty) days.  Marland Kitchen etonogestrel (NEXPLANON) 68 MG IMPL implant Inject into the skin.  Marland Kitchen levothyroxine (SYNTHROID) 75 MCG tablet Take 75 mcg by mouth daily.  Marland Kitchen LINZESS 290 MCG CAPS capsule Take 1 capsule by mouth.  . metoprolol succinate (TOPROL-XL) 25 MG 24 hr tablet Take 1 tablet by mouth as needed.  . Multiple Vitamins-Minerals (WOMENS MULTI VITAMIN & MINERAL) TABS Take 1 tablet by mouth daily.  . NURTEC 75 MG TBDP Take 1 tablet by mouth daily as needed.  . ondansetron (ZOFRAN-ODT) 8 MG disintegrating tablet Take 1 tablet (8 mg total) by mouth every 8 (eight) hours as needed for nausea.  Marland Kitchen topiramate (TOPAMAX) 200 MG tablet Take 200 mg by mouth 2 (two) times daily.     Allergies:   Penicillins   Social History   Socioeconomic History  . Marital status: Married    Spouse  name: Not on file  . Number of children: Not on file  . Years of education: Not on file  . Highest education level: Not on file  Occupational History  . Not on file  Tobacco Use  . Smoking status: Never Smoker  . Smokeless tobacco: Never Used  Substance and Sexual Activity  . Alcohol use: Not Currently  . Drug use: Never  . Sexual activity: Yes    Partners: Male    Birth control/protection: I.U.D.  Other Topics Concern  . Not on file  Social History Narrative  . Not on file   Social Determinants of Health   Financial Resource Strain:   . Difficulty of Paying Living Expenses: Not on file  Food Insecurity:   . Worried About Programme researcher, broadcasting/film/video in the Last Year: Not on file  . Ran Out of Food in the Last Year: Not on file  Transportation Needs:   . Lack of Transportation (Medical): Not on file  . Lack of Transportation (Non-Medical): Not on file  Physical  Activity:   . Days of Exercise per Week: Not on file  . Minutes of Exercise per Session: Not on file  Stress:   . Feeling of Stress : Not on file  Social Connections:   . Frequency of Communication with Friends and Family: Not on file  . Frequency of Social Gatherings with Friends and Family: Not on file  . Attends Religious Services: Not on file  . Active Member of Clubs or Organizations: Not on file  . Attends Banker Meetings: Not on file  . Marital Status: Not on file     Family History: The patient's family history includes Diabetes in her father, maternal grandfather, maternal grandmother, mother, paternal grandfather, and paternal grandmother; Hypertension in her father, maternal grandfather, maternal grandmother, mother, paternal grandfather, and paternal grandmother; Stroke in her maternal grandfather, maternal grandmother, paternal grandfather, and paternal grandmother.  ROS:   ROS Please see the history of present illness.     All other systems reviewed and are negative.  EKGs/Labs/Other  Studies Reviewed:    The following studies were reviewed today:   EKG: I did not repeat an EKG  Recent Labs: 08/31/2019: ALT 21; BUN 12; Creatinine, Ser 0.68; Hemoglobin 12.1; Platelets 300; Potassium 3.8; Sodium 140  Recent Lipid Panel No results found for: CHOL, TRIG, HDL, CHOLHDL, VLDL, LDLCALC, LDLDIRECT  Physical Exam:    VS:  BP 112/80   Pulse 92   Ht 5\' 5"  (1.651 m)   Wt 124 lb (56.2 kg)   SpO2 96%   BMI 20.63 kg/m     Wt Readings from Last 3 Encounters:  11/24/19 124 lb (56.2 kg)  08/24/19 126 lb 4.8 oz (57.3 kg)  08/03/19 129 lb 12.8 oz (58.9 kg)     GEN: She appears healthy well nourished, well developed in no acute distress HEENT: Normal NECK: No JVD; No carotid bruits LYMPHATICS: No lymphadenopathy CARDIAC: She has a grade 1/6 to 2/6 ejection murmur pulmonic area no pulmonary regurgitation S2 normal RRR, no murmurs, rubs, gallops RESPIRATORY:  Clear to auscultation without rales, wheezing or rhonchi  ABDOMEN: Soft, non-tender, non-distended MUSCULOSKELETAL:  No edema; No deformity  SKIN: Warm and dry NEUROLOGIC:  Alert and oriented x 3 PSYCHIATRIC:  Normal affect     Signed, 08/05/19, MD  11/24/2019 3:26 PM    [facility]

## 2019-11-24 ENCOUNTER — Ambulatory Visit: Payer: BC Managed Care – PPO | Admitting: Cardiology

## 2019-11-24 ENCOUNTER — Other Ambulatory Visit: Payer: Self-pay

## 2019-11-24 ENCOUNTER — Ambulatory Visit (INDEPENDENT_AMBULATORY_CARE_PROVIDER_SITE_OTHER): Payer: BC Managed Care – PPO

## 2019-11-24 ENCOUNTER — Telehealth: Payer: Self-pay

## 2019-11-24 ENCOUNTER — Encounter: Payer: Self-pay | Admitting: Cardiology

## 2019-11-24 VITALS — BP 112/80 | HR 92 | Ht 65.0 in | Wt 124.0 lb

## 2019-11-24 DIAGNOSIS — R079 Chest pain, unspecified: Secondary | ICD-10-CM | POA: Diagnosis not present

## 2019-11-24 DIAGNOSIS — R002 Palpitations: Secondary | ICD-10-CM

## 2019-11-24 DIAGNOSIS — R011 Cardiac murmur, unspecified: Secondary | ICD-10-CM | POA: Diagnosis not present

## 2019-11-24 DIAGNOSIS — R0602 Shortness of breath: Secondary | ICD-10-CM | POA: Diagnosis not present

## 2019-11-24 NOTE — Addendum Note (Signed)
Addended by: Delorse Limber I on: 11/24/2019 04:18 PM   Modules accepted: Orders

## 2019-11-24 NOTE — Patient Instructions (Signed)
Medication Instructions:  Your physician recommends that you continue on your current medications as directed. Please refer to the Current Medication list given to you today.  *If you need a refill on your cardiac medications before your next appointment, please call your pharmacy*   Lab Work: None If you have labs (blood work) drawn today and your tests are completely normal, you will receive your results only by: Marland Kitchen MyChart Message (if you have MyChart) OR . A paper copy in the mail If you have any lab test that is abnormal or we need to change your treatment, we will call you to review the results.   Testing/Procedures: Your physician has requested that you have an echocardiogram. Echocardiography is a painless test that uses sound waves to create images of your heart. It provides your doctor with information about the size and shape of your heart and how well your heart's chambers and valves are working. This procedure takes approximately one hour. There are no restrictions for this procedure.  We have put in the order for you to have a lower extremity venous duplex.   A zio monitor was ordered today. It will remain on for 14 days. You will then return monitor and event diary in provided box. It takes 1-2 weeks for report to be downloaded and returned to [place]. We will call you with the results. If monitor falls off or has orange flashing light, please call Zio for further instructions.      Follow-Up: At [facility], you and your health needs are our priority.  As part of our continuing mission to provide you with exceptional heart care, we have created designated Provider Care Teams.  These Care Teams include your primary Cardiologist (physician) and Advanced Practice Providers (APPs -  Physician Assistants and Nurse Practitioners) who all work together to provide you with the care you need, when you need it.  We recommend signing up for the patient portal called "MyChart".  Sign up  information is provided on this After Visit Summary.  MyChart is used to connect with patients for Virtual Visits (Telemedicine).  Patients are able to view lab/test results, encounter notes, upcoming appointments, etc.  Non-urgent messages can be sent to your provider as well.   To learn more about what you can do with MyChart, go to [email or web address].    Your next appointment:   6 week(s)  The format for your next appointment:   In Person  Provider:   Norman Herrlich, MD   Other Instructions

## 2019-11-24 NOTE — Addendum Note (Signed)
Addended by: Delorse Limber I on: 11/24/2019 04:12 PM   Modules accepted: Orders

## 2019-11-24 NOTE — Telephone Encounter (Signed)
No answer, no voicemail. Will try to call patient back at a later time. (1st attempt)  Pts husband called and said that the pt wanted orders for STAT labs entered and a UA. He said that she is having back pain and wants these labs done ASAP or to be seen ASAP. He said that she has an appt this afternoon with Cardiology.

## 2019-11-25 NOTE — Telephone Encounter (Signed)
No answer, no voicemail. Will try to call patient back at a later time. (2nd attempt)  

## 2019-11-26 NOTE — Telephone Encounter (Signed)
Pt has been communicating with Christen Butter, FNP via MyChart messages. Please see messages dated 11/20/2019 and 11/21/2019.

## 2019-12-05 DIAGNOSIS — F419 Anxiety disorder, unspecified: Secondary | ICD-10-CM | POA: Diagnosis not present

## 2019-12-05 DIAGNOSIS — E039 Hypothyroidism, unspecified: Secondary | ICD-10-CM | POA: Diagnosis not present

## 2019-12-05 DIAGNOSIS — Z880 Allergy status to penicillin: Secondary | ICD-10-CM | POA: Diagnosis not present

## 2019-12-05 DIAGNOSIS — Z79899 Other long term (current) drug therapy: Secondary | ICD-10-CM | POA: Diagnosis not present

## 2019-12-05 DIAGNOSIS — F319 Bipolar disorder, unspecified: Secondary | ICD-10-CM | POA: Diagnosis not present

## 2019-12-05 DIAGNOSIS — Z79891 Long term (current) use of opiate analgesic: Secondary | ICD-10-CM | POA: Diagnosis not present

## 2019-12-05 DIAGNOSIS — S39012A Strain of muscle, fascia and tendon of lower back, initial encounter: Secondary | ICD-10-CM | POA: Diagnosis not present

## 2019-12-05 DIAGNOSIS — X58XXXA Exposure to other specified factors, initial encounter: Secondary | ICD-10-CM | POA: Diagnosis not present

## 2019-12-05 DIAGNOSIS — R8290 Unspecified abnormal findings in urine: Secondary | ICD-10-CM | POA: Diagnosis not present

## 2019-12-05 DIAGNOSIS — M545 Low back pain, unspecified: Secondary | ICD-10-CM | POA: Diagnosis not present

## 2019-12-05 DIAGNOSIS — R110 Nausea: Secondary | ICD-10-CM | POA: Diagnosis not present

## 2019-12-07 ENCOUNTER — Encounter: Payer: Self-pay | Admitting: Medical-Surgical

## 2019-12-07 ENCOUNTER — Telehealth (INDEPENDENT_AMBULATORY_CARE_PROVIDER_SITE_OTHER): Payer: BC Managed Care – PPO | Admitting: Medical-Surgical

## 2019-12-07 DIAGNOSIS — M545 Low back pain, unspecified: Secondary | ICD-10-CM | POA: Diagnosis not present

## 2019-12-07 NOTE — Progress Notes (Signed)
Virtual Visit via Telephone   I connected with  Yesenia Harper  on 12/07/19 by telephone/telehealth and verified that I am speaking with the correct person using two identifiers.   I discussed the limitations, risks, security and privacy concerns of performing an evaluation and management service by telephone, including the higher likelihood of inaccurate diagnosis and treatment, and the availability of in person appointments.  We also discussed the likely need of an additional face to face encounter for complete and high quality delivery of care.  I also discussed with the patient that there may be a patient responsible charge related to this service. The patient expressed understanding and wishes to proceed.  Provider location is in medical facility. Patient location is at their home, different from provider location. People involved in care of the patient during this telehealth encounter were myself, my nurse/medical assistant, and my front office/scheduling team member.  CC: Back pain  HPI: Pleasant 34 year old female presenting via telephone with complaints of back pain.  Efforts were made to connect via video visit for least 10 minutes without success before converting to a telephone visit.  Patient was seen back in August with complaints of back pain.  At the time we suspected a possible kidney stone.  A CT was completed with no stones visualized.  Patient noted her pain resolved approximately 1.5 days later.  Unfortunately her pain has now returned.  She notes that she was having some urinary urgency prior to the Thanksgiving holiday that resolved after about 24 hours.  Over the weekend she had a development of severe back pain that was similar to labor pains.  She went to the ED on 11/27 and was evaluated there.  They did lab work and urinalysis which all looked okay.  At the time she was told that her pain was likely musculoskeletal, given Toradol, and discharged home.  Notes that just prior  to her ED visit, her urine did appear somewhat cloudy.  She also ran a fever for approximately half of that up to a T-max of 101.8.  She took Tylenol after that and her fever came back down.  She has had multiple CTs in the last year so no CT was repeated at the emergency room.  Patient reports feeling that her back pain and symptoms do come in spells or flares.  Unsure what brings them on but when she is experiencing a flare, the pain is excruciating.  She is very concerned due to having multiple different symptoms that seem to be unexplained.  She notes that she still has pain medication from many months ago and she is not seeking to treat a symptom.  She is more concerned with the cause of her symptoms and is looking for a diagnosis that could explain the things that she is experiencing.  Review of Systems: See HPI for pertinent positives and negatives.   Objective Findings:    General: Speaking full sentences, no audible heavy breathing.  Sounds alert and appropriately interactive.    Impression and Recommendations:    1. Acute bilateral low back pain without sciatica Although she does have some lab work from the ED at that is minimally out of range, she is very concerned about these things.  On review of her labs, there is nothing outstanding that is emergent or requires further investigation at this time.  There are a few lab and urine evaluations that have not been completed yet.  We will do blood and urine for porphyrins as AIP  can present with neurovascular symptoms similar to what she is experiencing. Also checking ESR and CRP.  We will get an MRI of the lumbar spine.  This may give [place] good information regarding her spine but may also provide information regarding structures in the area that may be causing her pain.  Strongly recommend following up with urology.  I discussed the above assessment and treatment plan with the patient. The patient was provided an opportunity to ask questions and  all were answered. The patient agreed with the plan and demonstrated an understanding of the instructions.   The patient was advised to call back or seek an in-person evaluation if the symptoms worsen or if the condition fails to improve as anticipated.  30 minutes of non-face-to-face time was provided during this encounter.  Return if symptoms worsen or fail to improve. ___________________________________________ Samuel Bouche, DNP, APRN, FNP-BC [facility] and [place]

## 2019-12-09 ENCOUNTER — Other Ambulatory Visit: Payer: Self-pay | Admitting: Medical-Surgical

## 2019-12-09 ENCOUNTER — Other Ambulatory Visit: Payer: Self-pay

## 2019-12-09 DIAGNOSIS — M545 Low back pain, unspecified: Secondary | ICD-10-CM

## 2019-12-09 MED ORDER — ONDANSETRON 8 MG PO TBDP: 8.0000 mg | ORAL_TABLET | Freq: Three times a day (TID) | ORAL | 0 refills | Status: DC | PRN

## 2019-12-10 ENCOUNTER — Other Ambulatory Visit: Payer: Self-pay | Admitting: Medical-Surgical

## 2019-12-16 ENCOUNTER — Ambulatory Visit (HOSPITAL_BASED_OUTPATIENT_CLINIC_OR_DEPARTMENT_OTHER): Payer: BC Managed Care – PPO

## 2019-12-17 DIAGNOSIS — R002 Palpitations: Secondary | ICD-10-CM | POA: Diagnosis not present

## 2019-12-18 ENCOUNTER — Telehealth: Payer: Self-pay

## 2019-12-18 DIAGNOSIS — R110 Nausea: Secondary | ICD-10-CM | POA: Diagnosis not present

## 2019-12-18 DIAGNOSIS — M419 Scoliosis, unspecified: Secondary | ICD-10-CM | POA: Insufficient documentation

## 2019-12-18 DIAGNOSIS — K5900 Constipation, unspecified: Secondary | ICD-10-CM | POA: Diagnosis not present

## 2019-12-18 DIAGNOSIS — R319 Hematuria, unspecified: Secondary | ICD-10-CM | POA: Insufficient documentation

## 2019-12-18 DIAGNOSIS — R1084 Generalized abdominal pain: Secondary | ICD-10-CM | POA: Diagnosis not present

## 2019-12-18 HISTORY — DX: Scoliosis, unspecified: M41.9

## 2019-12-18 HISTORY — DX: Hematuria, unspecified: R31.9

## 2019-12-18 NOTE — Telephone Encounter (Signed)
-----   Message from Baldo Daub, MD sent at 12/18/2019  1:20 PM EST ----- Good result normal recording the events that she noted were a normal rhythm.  If she is still having episodes of palpitation I encouraged her to buy the smart phone adapter [employer] to medic episodes.  We can review them in the office

## 2019-12-18 NOTE — Telephone Encounter (Signed)
Left message on patients voicemail to please return our call.   

## 2019-12-21 ENCOUNTER — Telehealth: Payer: Self-pay

## 2019-12-21 NOTE — Telephone Encounter (Signed)
Spoke with patient regarding results and recommendation.  Patient verbalizes understanding and is agreeable to plan of care. Advised patient to call back with any issues or concerns.  

## 2019-12-21 NOTE — Telephone Encounter (Signed)
-----   Message from Brian J Munley, MD sent at 12/18/2019  1:20 PM EST ----- Good result normal recording the events that she noted were a normal rhythm.  If she is still having episodes of palpitation I encouraged her to buy the smart phone adapter Alive [employer] to medic episodes.  We can review them in the office 

## 2019-12-22 DIAGNOSIS — G4719 Other hypersomnia: Secondary | ICD-10-CM | POA: Diagnosis not present

## 2019-12-22 DIAGNOSIS — R0602 Shortness of breath: Secondary | ICD-10-CM | POA: Diagnosis not present

## 2019-12-22 LAB — PORPHYRINS, FRACTIONATED, RANDOM URINE
Coproporphyrin (CP) I: 12 ug/L (ref 0–15)
Coproporphyrin (CP) III: 44 ug/L (ref 0–49)
Heptacarboxyl (7-CP): 3 ug/L — ABNORMAL HIGH (ref 0–2)
Hexacarboxyl (6-CP): <1 ug/L (ref 0–1)
Pentacarboxyl (5-CP): 1 ug/L (ref 0–2)
Uroporphyrins (UP): 7 ug/L (ref 0–20)

## 2019-12-22 LAB — PORPHYRINS, TOTAL PLASMA: Porphyrins: <0.1 ug/dL (ref 0.0–1.0)

## 2019-12-22 LAB — SEDIMENTATION RATE: Sed Rate: 2 mm/hr (ref 0–32)

## 2019-12-22 LAB — HIGH SENSITIVITY CRP: CRP, High Sensitivity: 0.18 mg/L (ref 0.00–3.00)

## 2019-12-23 ENCOUNTER — Encounter: Payer: Self-pay | Admitting: Medical-Surgical

## 2019-12-23 NOTE — Progress Notes (Signed)
Results for:  Porphyrins, total plasma: <0.1  Copy of printed labs from [facility] sent to scanning into pt's chart.

## 2019-12-25 DIAGNOSIS — M47817 Spondylosis without myelopathy or radiculopathy, lumbosacral region: Secondary | ICD-10-CM | POA: Diagnosis not present

## 2019-12-25 DIAGNOSIS — M4807 Spinal stenosis, lumbosacral region: Secondary | ICD-10-CM | POA: Diagnosis not present

## 2019-12-25 DIAGNOSIS — M5136 Other intervertebral disc degeneration, lumbar region: Secondary | ICD-10-CM | POA: Diagnosis not present

## 2019-12-25 DIAGNOSIS — M5126 Other intervertebral disc displacement, lumbar region: Secondary | ICD-10-CM | POA: Diagnosis not present

## 2019-12-25 DIAGNOSIS — M5127 Other intervertebral disc displacement, lumbosacral region: Secondary | ICD-10-CM | POA: Diagnosis not present

## 2019-12-31 ENCOUNTER — Other Ambulatory Visit: Payer: Self-pay | Admitting: Medical-Surgical

## 2020-01-09 DIAGNOSIS — I509 Heart failure, unspecified: Secondary | ICD-10-CM

## 2020-01-09 DIAGNOSIS — I50810 Right heart failure, unspecified: Secondary | ICD-10-CM

## 2020-01-09 HISTORY — DX: Right heart failure, unspecified: I50.810

## 2020-01-09 HISTORY — DX: Heart failure, unspecified: I50.9

## 2020-01-15 ENCOUNTER — Ambulatory Visit: Payer: BC Managed Care – PPO | Admitting: Cardiology

## 2020-01-15 NOTE — Progress Notes (Deleted)
Cardiology Office Note:    Date:  01/15/2020   ID:  Yesenia Harper, DOB Jul 01, 1985, MRN [id number]  PCP:  Yesenia Butter, NP  Cardiologist:  Yesenia Herrlich, MD    Referring MD: Yesenia Butter, NP    ASSESSMENT:    No diagnosis found. PLAN:    In order of problems listed above:  1. ***   Next appointment: ***   Medication Adjustments/Labs and Tests Ordered: Current medicines are reviewed at length with the patient today.  Concerns regarding medicines are outlined above.  No orders of the defined types were placed in this encounter.  No orders of the defined types were placed in this encounter.   No chief complaint on file.   History of Present Illness:    Yesenia Harper is a 35 y.o. female with a hx of SOB and chest pain last seen 11/24/2019. Compliance with diet, lifestyle and medications: ***  Chart review: I reviewed her PCP office note 11/13/2019 She had a stress echocardiogram performed 11/06/2019 ,she achieved target heart rate normal exercise capacity and normal stress echo with no evidence of ischemia. She had a 14-day event monitor performed 07/29/2019 showing sinus rhythm with no clinical arrhythmia.  She had triggered events that were associated with sinus rhythm and sinus tachycardia. She was seen [facility] ED 11/13/2019 with chest discomfort.  Testing included CBC normal hemoglobin 12.6 potassium mildly decreased 3.4 creatinine 0.8 GFR 98 cc high-sensitivity troponin was normal D-dimer was normal COVID-19 testing normal chest x-ray normal.  She was discharged from the emergency room.  Her EKG was described as sinus rhythm and normal.   She was seen at [facility] cardiology 07/07/2019 for chest pain it was described as sharp substernal with shortness of breath and palpitation occurring with and without exertion and lasting for hours.  Her physical examination was unremarkable and she has advise further evaluation including a stress echocardiogram.   She presented the  emergency room 11/13/2019 stat EKG sinus rhythm normal onset left without being seen x-ray was also normal.   Further evaluation she had duplex lower extremities May negative for DVT and had a CT angiogram which showed no evidence of pulmonary pathology pulmonary embolism and no aortic atherosclerosis or coronary artery calcification.   Since the time of her stress echo her symptoms have worsened.  She has multiple cardiovascular symptoms including edema and weight gain shortness of breath with exertion she relates a history of a decade ago of having a duplex of the legs with infrapopliteal thrombus left lower extremity which was not treated with anticoagulation.  She has chest pain that is nearly constant its been present since July and it is sharp in nature left chest radiates towards the shoulder and into the back worse with a deep breath not positional not alleviated with any posture.  Its not exertional angina.  She also is aware of her heart racing with physical effort and does not feel that the monitor captured her symptoms.  She has had no syncope.  She has no history of congenital or rheumatic heart disease.  She is scheduled for pulmonary function test she off she also coughs.  She has no history of asthma there is no been no chest wall trauma.  Event monitor for 14 days initiating 11/24/2019 was unremarkable and the symptomatic and triggered events were unassociated with arrhythmia.  Past Medical History:  Diagnosis Date  . Anxiety   . Anxiety with depression 08/03/2019  . Delivery of pregnancy by cesarean section   .  Depression   . Hypothyroidism     Past Surgical History:  Procedure Laterality Date  . CESAREAN SECTION      Current Medications: No outpatient medications have been marked as taking for the 01/15/20 encounter (Appointment) with Baldo Daub, MD.     Allergies:   Penicillins   Social History   Socioeconomic History  . Marital status: Married    Spouse name: Not  on file  . Number of children: Not on file  . Years of education: Not on file  . Highest education level: Not on file  Occupational History  . Not on file  Tobacco Use  . Smoking status: Never Smoker  . Smokeless tobacco: Never Used  Substance and Sexual Activity  . Alcohol use: Not Currently  . Drug use: Never  . Sexual activity: Yes    Partners: Male    Birth control/protection: I.U.D.  Other Topics Concern  . Not on file  Social History Narrative  . Not on file   Social Determinants of Health   Financial Resource Strain: Not on file  Food Insecurity: Not on file  Transportation Needs: Not on file  Physical Activity: Not on file  Stress: Not on file  Social Connections: Not on file     Family History: The patient's ***family history includes Diabetes in her father, maternal grandfather, maternal grandmother, mother, paternal grandfather, and paternal grandmother; Hypertension in her father, maternal grandfather, maternal grandmother, mother, paternal grandfather, and paternal grandmother; Stroke in her maternal grandfather, maternal grandmother, paternal grandfather, and paternal grandmother. ROS:   Please see the history of present illness.    All other systems reviewed and are negative.  EKGs/Labs/Other Studies Reviewed:    The following studies were reviewed today:  EKG:  EKG ordered today and personally reviewed.  The ekg ordered today demonstrates ***  Recent Labs: 08/31/2019: ALT 21; BUN 12; Creatinine, Ser 0.68; Hemoglobin 12.1; Platelets 300; Potassium 3.8; Sodium 140  Recent Lipid Panel No results found for: CHOL, TRIG, HDL, CHOLHDL, VLDL, LDLCALC, LDLDIRECT  Physical Exam:    VS:  There were no vitals taken for this visit.    Wt Readings from Last 3 Encounters:  11/24/19 124 lb (56.2 kg)  08/24/19 126 lb 4.8 oz (57.3 kg)  08/03/19 129 lb 12.8 oz (58.9 kg)     GEN: *** Well nourished, well developed in no acute distress HEENT: Normal NECK: No JVD;  No carotid bruits LYMPHATICS: No lymphadenopathy CARDIAC: ***RRR, no murmurs, rubs, gallops RESPIRATORY:  Clear to auscultation without rales, wheezing or rhonchi  ABDOMEN: Soft, non-tender, non-distended MUSCULOSKELETAL:  No edema; No deformity  SKIN: Warm and dry NEUROLOGIC:  Alert and oriented x 3 PSYCHIATRIC:  Normal affect    Signed, Yesenia Herrlich, MD  01/15/2020 7:36 AM    [facility]

## 2020-01-18 ENCOUNTER — Ambulatory Visit (HOSPITAL_BASED_OUTPATIENT_CLINIC_OR_DEPARTMENT_OTHER): Payer: BC Managed Care – PPO

## 2020-01-19 ENCOUNTER — Other Ambulatory Visit: Payer: Self-pay | Admitting: Medical-Surgical

## 2020-01-27 DIAGNOSIS — U071 COVID-19: Secondary | ICD-10-CM | POA: Diagnosis not present

## 2020-01-27 DIAGNOSIS — R52 Pain, unspecified: Secondary | ICD-10-CM | POA: Diagnosis not present

## 2020-01-27 DIAGNOSIS — R059 Cough, unspecified: Secondary | ICD-10-CM | POA: Diagnosis not present

## 2020-01-27 DIAGNOSIS — Z20822 Contact with and (suspected) exposure to covid-19: Secondary | ICD-10-CM | POA: Diagnosis not present

## 2020-01-27 DIAGNOSIS — R6883 Chills (without fever): Secondary | ICD-10-CM | POA: Diagnosis not present

## 2020-01-28 ENCOUNTER — Encounter: Payer: Self-pay | Admitting: Medical-Surgical

## 2020-01-28 ENCOUNTER — Telehealth (INDEPENDENT_AMBULATORY_CARE_PROVIDER_SITE_OTHER): Payer: BC Managed Care – PPO | Admitting: Medical-Surgical

## 2020-01-28 VITALS — HR 82 | Temp 98.0°F | Wt 123.6 lb

## 2020-01-28 DIAGNOSIS — U071 COVID-19: Secondary | ICD-10-CM | POA: Diagnosis not present

## 2020-01-28 MED ORDER — NEBULIZER/TUBING/MOUTHPIECE KIT: 1.0000 | PACK | Freq: Once | 0 refills | Status: DC

## 2020-01-28 MED ORDER — ALBUTEROL SULFATE (2.5 MG/3ML) 0.083% IN NEBU: 2.5000 mg | INHALATION_SOLUTION | Freq: Four times a day (QID) | RESPIRATORY_TRACT | 1 refills | Status: DC | PRN

## 2020-01-28 MED ORDER — NEBULIZER/TUBING/MOUTHPIECE KIT: 1.0000 | PACK | Freq: Once | 0 refills | Status: AC

## 2020-01-28 MED ORDER — HYDROCOD POLST-CPM POLST ER 10-8 MG/5ML PO SUER: 5.0000 mL | Freq: Two times a day (BID) | ORAL | 0 refills | Status: DC | PRN

## 2020-01-28 MED ORDER — DEXAMETHASONE 6 MG PO TABS: 6.0000 mg | ORAL_TABLET | Freq: Two times a day (BID) | ORAL | 0 refills | Status: DC

## 2020-01-28 NOTE — Progress Notes (Signed)
Virtual Visit via Video Note  I connected with Yesenia Harper on 01/28/20 at  1:00 PM EST by a video enabled telemedicine application and verified that I am speaking with the correct person using two identifiers.   I discussed the limitations of evaluation and management by telemedicine and the availability of in person appointments. The patient expressed understanding and agreed to proceed.  Patient location: home Provider locations: office  Subjective:    CC: COVID-19 positive  HPI: Pleasant 35 year old female presenting via MyChart video visit to discuss upper respiratory symptoms and positive COVID test.  Notes that she started with symptoms on 1/18 with significant headache.  The next day sh developed additional sinus symptoms and tested at home for COVID with a positive result.  She went to the testing center yesterday and received positive results as well.  Her husband and 70-year-old child have also tested positive for COVID.  Currently she is experiencing chest tightness, cough, diarrhea, severe headache, difficulty sleeping, low-grade fever with chills, loss of smell, decreased taste, decreased appetite, and sore/scratchy throat.  She has tried an albuterol inhaler which was no help.  Also tried Mucinex but did not see any lasting benefit.  Has been pushing water but finds that she has no desire for food or drink for the most part.   Past medical history, Surgical history, Family history not pertinant except as noted below, Social history, Allergies, and medications have been entered into the medical record, reviewed, and corrections made.   Review of Systems: See HPI for pertinent positives and negatives.   Objective:    General: Speaking clearly in complete sentences without any shortness of breath.  Alert and oriented x3.  Normal judgment. No apparent acute distress.  Impression and Recommendations:    1. COVID-19 virus infection Discussed the nature of COVID and the  expectations of progression of illness.  Since she is interested in an albuterol nebulizer, I think this is reasonable.  Sending in a prescription for albuterol nebulizer solution as well as a nebulizer device with associated supplies.  We will do a 5-day course of twice daily Decadron 6 mg.  Also sending in Tussionex twice daily as needed.  Recommend over-the-counter cold and flu preparations for symptomatic treatment if desired.  A work note was sent through MyChart at patient request.  Reviewed emergency symptoms to seek urgent care/emergency room care for.  Patient verbalized understanding and is agreeable to the plan.  I discussed the assessment and treatment plan with the patient. The patient was provided an opportunity to ask questions and all were answered. The patient agreed with the plan and demonstrated an understanding of the instructions.   The patient was advised to call back or seek an in-person evaluation if the symptoms worsen or if the condition fails to improve as anticipated.  20 minutes of non-face-to-face time was provided during this encounter.  Return if symptoms worsen or fail to improve.  Thayer Ohm, DNP, APRN, FNP-BC [facility]

## 2020-01-28 NOTE — Telephone Encounter (Signed)
Can you print and fax to [facility]?  Fax number [phone].

## 2020-01-29 ENCOUNTER — Encounter: Payer: Self-pay | Admitting: Medical-Surgical

## 2020-01-29 ENCOUNTER — Telehealth: Payer: Self-pay

## 2020-01-29 NOTE — Telephone Encounter (Signed)
Pt's husband Mardelle Matte (on [place] form) called and said that he went to [facility] to pick up the nebulizer and supplies and was told that it would not be covered through them, it would need to go through a DME company. He is requesting the information be sent to [facility] at fax number [phone]. Rx has been tee'd up below and ready for review and approval/denial.

## 2020-02-01 ENCOUNTER — Telehealth: Payer: Self-pay

## 2020-02-01 NOTE — Telephone Encounter (Signed)
No answer, no voicemail. Will try to call patient back at a later time. (1st attempt)   Pt called and LVM stating that she has some rattling going on, with the left side being worse than the right. She states she does have some intermittent pain. She would like to pull through under the awning and have someone go out and listen to her lungs. I spoke with Joy who said that the pt is COVID positive so these sx are going to happen, if she feels the sx are worse than when she had her VV last week, to proceed to UC or ED because her schedule is fully booked.

## 2020-02-02 NOTE — Telephone Encounter (Signed)
No answer, no voicemail. Will try to call patient back at a later time. (2nd attempt)  Pt called and LVM stating that she has some rattling going on, with the left side being worse than the right. She states she does have some intermittent pain. She would like to pull through under the awning and have someone go out and listen to her lungs. I spoke with Joy who said that the pt is COVID positive so these sx are going to happen, if she feels the sx are worse than when she had her VV last week, to proceed to UC or ED because her schedule is fully booked.

## 2020-02-03 ENCOUNTER — Ambulatory Visit: Payer: Self-pay

## 2020-02-03 ENCOUNTER — Emergency Department
Admission: RE | Admit: 2020-02-03 | Discharge: 2020-02-03 | Disposition: A | Discharge status: Home / Self Care | Payer: BC Managed Care – PPO | Source: Ambulatory Visit

## 2020-02-03 ENCOUNTER — Emergency Department (INDEPENDENT_AMBULATORY_CARE_PROVIDER_SITE_OTHER): Payer: BC Managed Care – PPO

## 2020-02-03 ENCOUNTER — Other Ambulatory Visit: Payer: Self-pay

## 2020-02-03 VITALS — BP 135/88 | HR 76 | Temp 98.7°F | Resp 16

## 2020-02-03 DIAGNOSIS — U099 Post covid-19 condition, unspecified: Secondary | ICD-10-CM

## 2020-02-03 DIAGNOSIS — R197 Diarrhea, unspecified: Secondary | ICD-10-CM | POA: Diagnosis not present

## 2020-02-03 DIAGNOSIS — R059 Cough, unspecified: Secondary | ICD-10-CM | POA: Diagnosis not present

## 2020-02-03 DIAGNOSIS — R110 Nausea: Secondary | ICD-10-CM | POA: Diagnosis not present

## 2020-02-03 DIAGNOSIS — U071 COVID-19: Secondary | ICD-10-CM

## 2020-02-03 DIAGNOSIS — R0789 Other chest pain: Secondary | ICD-10-CM | POA: Diagnosis not present

## 2020-02-03 DIAGNOSIS — R531 Weakness: Secondary | ICD-10-CM

## 2020-02-03 NOTE — Telephone Encounter (Signed)
I spoke with patient and she did go to the urgent care today. She states it was a waste of time. I tried to schedule her for tomorrow. She declined.

## 2020-02-03 NOTE — Telephone Encounter (Signed)
Pt is currently at [facility] at time of call. Will try to call back at a later time.

## 2020-02-03 NOTE — Telephone Encounter (Signed)
Please see MyChart message dated 01/29/2020

## 2020-02-03 NOTE — Discharge Instructions (Signed)
°  Begin Pedialye for about 12 to 18 hours until diarrhea stops, then switch to clear liquids (apple juice, clear grape juice, Jello, etc) for about 12 to 18 hours.  When improved, advance to a BRAT diet (Bananas, Rice, Applesauce, Toast). Then gradually resume a regular diet when tolerated.  Avoid milk products until well.  When stools become more formed, may take Imodium (loperamide) once or twice daily to decrease stool frequency.  °If symptoms become significantly worse during the night or over the weekend, proceed to the local emergency room. ° ° °

## 2020-02-03 NOTE — ED Provider Notes (Signed)
[facility]    CSN: [id number] Arrival date & time: 02/03/20  1243      History   Chief Complaint Chief Complaint  Patient presents with  . Nausea    HPI Shaquoia Miers is a 35 y.o. female.   HPI Teena Mangus is a 35 y.o. female presenting to UC with c/o nausea, diarrhea, fatigue, and chest tightness since being dx with COVID 9 days ago.  She has been taking imodium and trying to stay hydrated but reports 3-4 episodes of diarrhea a day.  She has only tried eating crackers. Denies fever or chills. No vomiting.  No SOB. No abdominal pain at this time.    Past Medical History:  Diagnosis Date  . Anxiety   . Anxiety with depression 08/03/2019  . Delivery of pregnancy by cesarean section   . Depression   . Hypothyroidism     Patient Active Problem List   Diagnosis Date Noted  . Depression   . Delivery of pregnancy by cesarean section   . Anxiety   . Hypothyroidism 08/03/2019  . Dry cough 08/03/2019  . Anxiety with depression 08/03/2019    Past Surgical History:  Procedure Laterality Date  . CESAREAN SECTION      OB History   No obstetric history on file.      Home Medications    Prior to Admission medications   Medication Sig Start Date End Date Taking? Authorizing Provider  albuterol (PROVENTIL) (2.5 MG/3ML) 0.083% nebulizer solution Take 3 mLs (2.5 mg total) by nebulization every 6 (six) hours as needed for wheezing or shortness of breath. 01/28/20   Christen Butter, NP  ALPRAZolam Prudy Feeler) 0.5 MG tablet Take 0.5 mg by mouth 3 (three) times daily as needed. 05/21/19   [provider]  buPROPion (WELLBUTRIN XL) 150 MG 24 hr tablet Take 3 tablets (450 mg total) by mouth daily. **OFFICE VISIT MANDATORY BEFORE 08/07/2020 FOR FUTURE REFILLS** 01/20/20   Christen Butter, NP  chlorpheniramine-HYDROcodone (TUSSIONEX) 10-8 MG/5ML SUER Take 5 mLs by mouth every 12 (twelve) hours as needed for cough (cough, will cause drowsiness.). 01/28/20   Christen Butter, NP   cyanocobalamin (,VITAMIN B-12,) 1000 MCG/ML injection Inject 1,000 mg into the muscle every 30 (thirty) days. 05/26/19   [provider]  dexamethasone (DECADRON) 6 MG tablet Take 1 tablet (6 mg total) by mouth 2 (two) times daily with a meal. 01/28/20   Christen Butter, NP  etonogestrel (NEXPLANON) 68 MG IMPL implant Inject into the skin.    [provider]  levothyroxine (SYNTHROID) 75 MCG tablet Take 75 mcg by mouth daily. 07/21/19   [provider]  LINZESS 290 MCG CAPS capsule Take 1 capsule by mouth. 07/14/19   [provider]  metoprolol succinate (TOPROL-XL) 25 MG 24 hr tablet Take 1 tablet by mouth as needed.    [provider]  Multiple Vitamins-Minerals (WOMENS MULTI VITAMIN & MINERAL) TABS Take 1 tablet by mouth daily.    [provider]  NURTEC 75 MG TBDP Take 1 tablet by mouth daily as needed. 07/28/19   [provider]  ondansetron (ZOFRAN-ODT) 8 MG disintegrating tablet Take 1 tablet (8 mg total) by mouth every 8 (eight) hours as needed for nausea. 12/09/19   Christen Butter, NP  promethazine (PHENERGAN) 25 MG tablet Take 25 mg by mouth every 6 (six) hours as needed.  06/26/19   [provider]  topiramate (TOPAMAX) 200 MG tablet Take 200 mg by mouth 2 (two) times daily. 07/21/19  [provider]    Family History Family History  Problem Relation Age of Onset  . Hypertension Mother   . Diabetes Mother   . Hypertension Father   . Diabetes Father   . Diabetes Maternal Grandmother   . Hypertension Maternal Grandmother   . Stroke Maternal Grandmother   . Diabetes Maternal Grandfather   . Hypertension Maternal Grandfather   . Stroke Maternal Grandfather   . Diabetes Paternal Grandmother   . Hypertension Paternal Grandmother   . Stroke Paternal Grandmother   . Diabetes Paternal Grandfather   . Hypertension Paternal Grandfather   . Stroke Paternal Grandfather     Social History Social History   Tobacco  Use  . Smoking status: Never Smoker  . Smokeless tobacco: Never Used  Substance Use Topics  . Alcohol use: Not Currently  . Drug use: Never     Allergies   Penicillins   Review of Systems Review of Systems  Constitutional: Positive for fatigue. Negative for chills and fever.  HENT: Negative for congestion, ear pain, sore throat, trouble swallowing and voice change.   Respiratory: Positive for cough and chest tightness. Negative for shortness of breath.   Cardiovascular: Negative for chest pain and palpitations.  Gastrointestinal: Positive for diarrhea and nausea. Negative for abdominal pain and vomiting.  Musculoskeletal: Negative for arthralgias, back pain and myalgias.  Skin: Negative for rash.  All other systems reviewed and are negative.    Physical Exam Triage Vital Signs ED Triage Vitals  Enc Vitals Group     BP 02/03/20 1259 135/88     Pulse Rate 02/03/20 1259 76     Resp 02/03/20 1259 16     Temp 02/03/20 1259 98.7 F (37.1 C)     Temp Source 02/03/20 1259 Oral     SpO2 02/03/20 1259 100 %     Weight --      Height --      Head Circumference --      Peak Flow --      Pain Score 02/03/20 1258 0     Pain Loc --      Pain Edu? --      Excl. in GC? --    No data found.  Updated Vital Signs BP 135/88 (BP Location: Right Arm)   Pulse 76   Temp 98.7 F (37.1 C) (Oral)   Resp 16   SpO2 100%   Visual Acuity Right Eye Distance:   Left Eye Distance:   Bilateral Distance:    Right Eye Near:   Left Eye Near:    Bilateral Near:     Physical Exam Vitals and nursing note reviewed.  Constitutional:      General: She is not in acute distress.    Appearance: Normal appearance. She is well-developed and well-nourished. She is not ill-appearing, toxic-appearing or diaphoretic.  HENT:     Head: Normocephalic and atraumatic.     Right Ear: Tympanic membrane and ear canal normal.     Left Ear: Tympanic membrane and ear canal normal.     Nose: Nose normal.      Right Sinus: No maxillary sinus tenderness or frontal sinus tenderness.     Left Sinus: No maxillary sinus tenderness or frontal sinus tenderness.     Mouth/Throat:     Lips: Pink.     Mouth: Mucous membranes are moist.     Pharynx: Oropharynx is clear. Uvula midline.  Eyes:     Extraocular Movements: EOM normal.  Cardiovascular:  Rate and Rhythm: Normal rate and regular rhythm.  Pulmonary:     Effort: Pulmonary effort is normal. No respiratory distress.     Breath sounds: Normal breath sounds. No stridor. No wheezing, rhonchi or rales.  Abdominal:     General: There is no distension.     Palpations: Abdomen is soft.     Tenderness: There is no abdominal tenderness.  Musculoskeletal:        General: Normal range of motion.     Cervical back: Normal range of motion and neck supple.  Lymphadenopathy:     Cervical: No cervical adenopathy.  Skin:    General: Skin is warm and dry.  Neurological:     Mental Status: She is alert and oriented to person, place, and time.  Psychiatric:        Mood and Affect: Mood and affect normal.        Behavior: Behavior normal.      UC Treatments / Results  Labs (all labs ordered are listed, but only abnormal results are displayed) Labs Reviewed - No data to display  EKG   Radiology DG Chest 2 View  Result Date: 02/03/2020 CLINICAL DATA:  Cough, congestion, chest tightness, nausea, tested positive for COVID-19 nine days ago EXAM: CHEST - 2 VIEW COMPARISON:  11/13/2019 FINDINGS: Normal heart size, mediastinal contours, and pulmonary vascularity. Minimal chronic peribronchial thickening. No pulmonary infiltrate, pleural effusion or pneumothorax. Osseous structures unremarkable. IMPRESSION: No acute abnormalities. Minimal chronic bronchitic changes. Electronically Signed   By: Ulyses Southward M.D.   On: 02/03/2020 13:37    Procedures Procedures (including critical care time)  Medications Ordered in UC Medications - No data to  display  Initial Impression / Assessment and Plan / UC Course  I have reviewed the triage vital signs and the nursing notes.  Pertinent labs & imaging results that were available during my care of the patient were reviewed by me and considered in my medical decision making (see chart for details).    Pt appears well, NAD Vitals: WNL Abd: soft, non-tender. CXR: no acute findings Discussed diet for diarrhea including Bread Rice Apple sauce and Toast Close f/u with PCP  Discussed symptoms that warrant emergent care in the ED. AVS given  Final Clinical Impressions(s) / UC Diagnoses   Final diagnoses:  Nausea without vomiting  Diarrhea, unspecified type  Generalized weakness  COVID     Discharge Instructions     Begin Pedialye for about 12 to 18 hours until diarrhea stops, then switch to clear liquids (apple juice, clear grape juice, Jello, etc) for about 12 to 18 hours.  When improved, advance to a [employer] (Bananas, Rice, Applesauce, Toast). Then gradually resume a regular diet when tolerated.  Avoid milk products until well.  When stools become more formed, may take Imodium (loperamide) once or twice daily to decrease stool frequency.  If symptoms become significantly worse during the night or over the weekend, proceed to the local emergency room.     ED Prescriptions    None     PDMP not reviewed this encounter.   Lurene Shadow, [place] 02/03/20 [phone]

## 2020-02-03 NOTE — ED Triage Notes (Signed)
Patient presents to Urgent Care with complaints of continued nausea and faituge since testing positive for covid 9 days ago. Patient reports she feels like she is dehydrated.

## 2020-02-08 ENCOUNTER — Other Ambulatory Visit: Payer: Self-pay

## 2020-03-07 ENCOUNTER — Other Ambulatory Visit: Payer: Self-pay

## 2020-03-07 ENCOUNTER — Ambulatory Visit (HOSPITAL_BASED_OUTPATIENT_CLINIC_OR_DEPARTMENT_OTHER)
Admission: RE | Admit: 2020-03-07 | Discharge: 2020-03-07 | Disposition: A | Discharge status: Home / Self Care | Payer: BC Managed Care – PPO | Source: Ambulatory Visit | Attending: Cardiology | Admitting: Cardiology

## 2020-03-07 DIAGNOSIS — R002 Palpitations: Secondary | ICD-10-CM | POA: Insufficient documentation

## 2020-03-07 DIAGNOSIS — R0602 Shortness of breath: Secondary | ICD-10-CM | POA: Diagnosis not present

## 2020-03-07 LAB — ECHOCARDIOGRAM COMPLETE
Area-P 1/2: 4.96 cm2
S' Lateral: 2.31 cm

## 2020-03-08 ENCOUNTER — Telehealth: Payer: Self-pay

## 2020-03-08 NOTE — Telephone Encounter (Signed)
left a message to return my call.  

## 2020-03-08 NOTE — Telephone Encounter (Signed)
-----   Message from Baldo Daub, MD sent at 03/08/2020  7:57 AM EST ----- Results normal echocardiogram

## 2020-03-10 NOTE — Telephone Encounter (Signed)
Left message for patient to return call.

## 2020-03-10 NOTE — Telephone Encounter (Addendum)
Patient returning call to discuss test results. Please call the the patient on her work number [phone]. If she is not at her desk please have her work page her.

## 2020-03-10 NOTE — Telephone Encounter (Signed)
Spoke with pt and reviewed echo results.  Pt mentioned that she's still having issues with pain and knots in her legs.  She wanted to know if Dr. Dulce Sellar had any recommendations in regards to this?

## 2020-03-10 NOTE — Telephone Encounter (Signed)
Not at this time, she has had an evaluation including a CTA of her chest.  She may want to discuss this with her primary care physician

## 2020-03-11 NOTE — Telephone Encounter (Signed)
Left message for patient to return call.

## 2020-03-21 ENCOUNTER — Encounter: Payer: Self-pay | Admitting: Medical-Surgical

## 2020-03-21 DIAGNOSIS — M79672 Pain in left foot: Secondary | ICD-10-CM

## 2020-03-21 DIAGNOSIS — R110 Nausea: Secondary | ICD-10-CM

## 2020-03-21 DIAGNOSIS — E039 Hypothyroidism, unspecified: Secondary | ICD-10-CM

## 2020-03-21 DIAGNOSIS — M7989 Other specified soft tissue disorders: Secondary | ICD-10-CM

## 2020-03-30 ENCOUNTER — Other Ambulatory Visit: Payer: Self-pay

## 2020-03-30 ENCOUNTER — Ambulatory Visit: Payer: BC Managed Care – PPO | Admitting: Medical-Surgical

## 2020-03-30 ENCOUNTER — Ambulatory Visit (INDEPENDENT_AMBULATORY_CARE_PROVIDER_SITE_OTHER): Payer: BC Managed Care – PPO

## 2020-03-30 ENCOUNTER — Encounter: Payer: Self-pay | Admitting: Medical-Surgical

## 2020-03-30 VITALS — BP 109/71 | HR 82 | Temp 98.6°F | Ht 65.0 in | Wt 127.8 lb

## 2020-03-30 DIAGNOSIS — M79672 Pain in left foot: Secondary | ICD-10-CM | POA: Diagnosis not present

## 2020-03-30 DIAGNOSIS — M7989 Other specified soft tissue disorders: Secondary | ICD-10-CM

## 2020-03-30 DIAGNOSIS — R110 Nausea: Secondary | ICD-10-CM

## 2020-03-30 DIAGNOSIS — E039 Hypothyroidism, unspecified: Secondary | ICD-10-CM | POA: Diagnosis not present

## 2020-03-30 MED ORDER — ONDANSETRON 8 MG PO TBDP: 8.0000 mg | ORAL_TABLET | Freq: Three times a day (TID) | ORAL | 0 refills | Status: DC | PRN

## 2020-03-30 NOTE — Progress Notes (Signed)
Subjective:    CC: left foot pain and swelling  HPI: Pleasant 35 year old female presenting for evaluation of left foot pain/swelling and nausea.   Left foot- for the last 2.5-3 weeks has experienced random, intermittent swelling of her left foot.  Notes the swelling is worse at the end of the day although she is keeping her foot elevated at work.  Has noticed some blotchy reddish-purple discoloration of her left foot.  She is having pain that extends from her foot to her knee that is worse with walking/increased activity.  Also notes that her hands have been colder than usual.  She did have an ultrasound for DVT approximately 3 to 4 weeks ago with normal results.  Denies new chest pain, shortness of breath, and palpitations.  She has had the symptoms for quite a while but there is no worsening or change in them.  Nausea-has had random episodes of nausea over the past 2 weeks.  She has had no vomiting but stays severely nauseated without relation to eating, drinking, or activities.  Notes that she has also had intermittent chills as well as diarrhea that was worse last week and has improved a bit so far this week.  Denies reflux symptoms although she has tried several PPIs in the past.  She is not currently taking a PPI.  Endorses a dry cough that she gets intermittently.  I reviewed the past medical history, family history, social history, surgical history, and allergies today and no changes were needed.  Please see the problem list section below in epic for further details.  Past Medical History: Past Medical History:  Diagnosis Date  . Anxiety   . Anxiety with depression 08/03/2019  . Delivery of pregnancy by cesarean section   . Depression   . Hypothyroidism    Past Surgical History: Past Surgical History:  Procedure Laterality Date  . CESAREAN SECTION     Social History: Social History   Socioeconomic History  . Marital status: Married    Spouse name: Not on file  . Number of  children: Not on file  . Years of education: Not on file  . Highest education level: Not on file  Occupational History  . Not on file  Tobacco Use  . Smoking status: Never Smoker  . Smokeless tobacco: Never Used  Substance and Sexual Activity  . Alcohol use: Not Currently  . Drug use: Never  . Sexual activity: Yes    Partners: Male    Birth control/protection: I.U.D.  Other Topics Concern  . Not on file  Social History Narrative  . Not on file   Social Determinants of Health   Financial Resource Strain: Not on file  Food Insecurity: Not on file  Transportation Needs: Not on file  Physical Activity: Not on file  Stress: Not on file  Social Connections: Not on file   Family History: Family History  Problem Relation Age of Onset  . Hypertension Mother   . Diabetes Mother   . Hypertension Father   . Diabetes Father   . Diabetes Maternal Grandmother   . Hypertension Maternal Grandmother   . Stroke Maternal Grandmother   . Diabetes Maternal Grandfather   . Hypertension Maternal Grandfather   . Stroke Maternal Grandfather   . Diabetes Paternal Grandmother   . Hypertension Paternal Grandmother   . Stroke Paternal Grandmother   . Diabetes Paternal Grandfather   . Hypertension Paternal Grandfather   . Stroke Paternal Grandfather    Allergies: Allergies  Allergen Reactions  .  Penicillins Itching    Patient feels itching in mouth.   Medications: See med rec.  Review of Systems: See HPI for pertinent positives and negatives.   Objective:    General: Well Developed, well nourished, and in no acute distress.  Neuro: Alert and oriented x3.  HEENT: Normocephalic, atraumatic.  Skin: Warm and dry. Cardiac: Regular rate and rhythm, no murmurs rubs or gallops, no lower extremity edema. Pedal pulses +, equal bilaterally. No foot/ankle swelling today.  Respiratory: Clear to auscultation bilaterally. Not using accessory muscles, speaking in full sentences.   Impression and  Recommendations:    1. Swelling of left foot 2. Left foot pain Checking CMP and PT/INR.  Stat left lower extremity ultrasound for DVT was negative.  Consider differentials of venous insufficiency and [name].  Lower extremity pain that worsens with ambulation concerning for claudication.  Referring to vein and vascular for further evaluation. - COMPLETE METABOLIC PANEL WITH GFR - US Venous Img Lower Unilateral Left; Future - INR/PT - Ambulatory referral to Vascular Surgery  3. Nausea Checking CBC with differential and CMP.  In the setting of diarrhea and chills, suspect this may be related to a viral gastroenteritis.  Refill of Zofran 8 mg ODT every 8 hours as needed sent to pharmacy. - ondansetron (ZOFRAN-ODT) 8 MG disintegrating tablet; Take 1 tablet (8 mg total) by mouth every 8 (eight) hours as needed for nausea.  Dispense: 20 tablet; Refill: 0 - CBC with Differential/Platelet - COMPLETE METABOLIC PANEL WITH GFR  4. Hypothyroidism, unspecified type Due for recheck of thyroid levels so adding TSH to blood work. - TSH  Return if symptoms worsen or fail to improve. ___________________________________________ Thayer Ohm, DNP, APRN, FNP-BC Primary Care and Sports Medicine [facility] [place]

## 2020-04-01 ENCOUNTER — Telehealth: Payer: Self-pay

## 2020-04-01 NOTE — Telephone Encounter (Signed)
No answer, no voicemail. Will try to call patient back at a later time. (1st attempt)   Pts husband Mardelle Matte (on [place] form) called and LVM stating that since Helaine's appt on 03/30/2020 she has been having some pain in her upper right chest area. He stated "she is in some real pain".

## 2020-04-05 NOTE — Telephone Encounter (Signed)
No answer, no voicemail. Will try to call patient back at a later time. (2nd attempt)  Pts husband Mardelle Matte (on [place] form) called and LVM stating that since Nayara's appt on 03/30/2020 she has been having some pain in her upper right chest area. He stated "she is in some real pain".

## 2020-04-06 NOTE — Telephone Encounter (Signed)
No answer, no voicemail. (3rd attempt) Unable to contact patient. Letter mailed to patient at the address listed on file.    Pts husband Andy(on [employer] form)called and LVM stating that since Yesenia Harper's appt on 03/30/2020 she has been having some pain in her upper right chest area. He stated "she is in some real pain".

## 2020-04-11 DIAGNOSIS — M7989 Other specified soft tissue disorders: Secondary | ICD-10-CM | POA: Diagnosis not present

## 2020-04-11 DIAGNOSIS — E039 Hypothyroidism, unspecified: Secondary | ICD-10-CM | POA: Diagnosis not present

## 2020-04-11 DIAGNOSIS — R110 Nausea: Secondary | ICD-10-CM | POA: Diagnosis not present

## 2020-04-11 DIAGNOSIS — M79672 Pain in left foot: Secondary | ICD-10-CM | POA: Diagnosis not present

## 2020-04-12 LAB — PROTIME-INR
INR: 1 (ref 0.9–1.2)
Prothrombin Time: 10.6 s (ref 9.1–12.0)

## 2020-04-12 LAB — CBC WITH DIFFERENTIAL/PLATELET
Basophils Absolute: 0 10*3/uL (ref 0.0–0.2)
Basos: 1 %
EOS (ABSOLUTE): 0.1 10*3/uL (ref 0.0–0.4)
Eos: 2 %
Hematocrit: 37.3 % (ref 34.0–46.6)
Hemoglobin: 12.4 g/dL (ref 11.1–15.9)
Immature Grans (Abs): 0 10*3/uL (ref 0.0–0.1)
Immature Granulocytes: 0 %
Lymphocytes Absolute: 2.5 10*3/uL (ref 0.7–3.1)
Lymphs: 43 %
MCH: 31 pg (ref 26.6–33.0)
MCHC: 33.2 g/dL (ref 31.5–35.7)
MCV: 93 fL (ref 79–97)
Monocytes Absolute: 0.6 10*3/uL (ref 0.1–0.9)
Monocytes: 10 %
Neutrophils Absolute: 2.4 10*3/uL (ref 1.4–7.0)
Neutrophils: 44 %
Platelets: 310 10*3/uL (ref 150–450)
RBC: 4 x10E6/uL (ref 3.77–5.28)
RDW: 11.9 % (ref 11.7–15.4)
WBC: 5.6 10*3/uL (ref 3.4–10.8)

## 2020-04-12 LAB — CMP14+EGFR
ALT: 29 IU/L (ref 0–32)
AST: 20 IU/L (ref 0–40)
Albumin/Globulin Ratio: 2.4 — ABNORMAL HIGH (ref 1.2–2.2)
Albumin: 4.7 g/dL (ref 3.8–4.8)
Alkaline Phosphatase: 48 IU/L (ref 44–121)
BUN/Creatinine Ratio: 18 (ref 9–23)
BUN: 15 mg/dL (ref 6–20)
Bilirubin Total: 0.3 mg/dL (ref 0.0–1.2)
CO2: 20 mmol/L (ref 20–29)
Calcium: 9.4 mg/dL (ref 8.7–10.2)
Chloride: 106 mmol/L (ref 96–106)
Creatinine, Ser: 0.84 mg/dL (ref 0.57–1.00)
Globulin, Total: 2 g/dL (ref 1.5–4.5)
Glucose: 84 mg/dL (ref 65–99)
Potassium: 3.7 mmol/L (ref 3.5–5.2)
Sodium: 139 mmol/L (ref 134–144)
Total Protein: 6.7 g/dL (ref 6.0–8.5)
eGFR: 94 mL/min/{1.73_m2} (ref >59)

## 2020-04-12 LAB — TSH: TSH: 1.61 u[IU]/mL (ref 0.450–4.500)

## 2020-04-16 ENCOUNTER — Encounter: Payer: Self-pay | Admitting: Medical-Surgical

## 2020-04-19 ENCOUNTER — Telehealth (INDEPENDENT_AMBULATORY_CARE_PROVIDER_SITE_OTHER): Payer: BC Managed Care – PPO | Admitting: Medical-Surgical

## 2020-04-19 ENCOUNTER — Encounter: Payer: Self-pay | Admitting: Medical-Surgical

## 2020-04-19 DIAGNOSIS — R300 Dysuria: Secondary | ICD-10-CM

## 2020-04-19 DIAGNOSIS — M546 Pain in thoracic spine: Secondary | ICD-10-CM

## 2020-04-19 MED ORDER — HYDROCODONE-ACETAMINOPHEN 5-325 MG PO TABS: 1.0000 | ORAL_TABLET | Freq: Three times a day (TID) | ORAL | 0 refills | Status: DC | PRN

## 2020-04-19 NOTE — Progress Notes (Signed)
Virtual Visit via Video Note  I connected with Yesenia Harper on 04/19/20 at  1:00 PM EDT by a video enabled telemedicine application and verified that I am speaking with the correct person using two identifiers.   I discussed the limitations of evaluation and management by telemedicine and the availability of in person appointments. The patient expressed understanding and agreed to proceed.  Patient location: home Provider locations: office  Subjective:    CC: cloudy urine, severe back pain  HPI: Pleasant 35 year old female presenting via MyChart video visit with complaints of several days of cloudy urine with severe mid-back pain on the left side. Notes increased frequency of urination, mostly at night but no burning, urgency, hesitancy, hematuria, or foul odor. Has also had nausea on a daily basis with the worst being over the weekend when her pain was very high. Today, rates her back pain at 5-6/10. No recent injury. Has been taking zofran and phenergan as needed. Also using Tylenol and a couple of Norco that she had left over from last August. Reports symptoms now feel just like back in August when there was suspicion for a kidney stone. Denies fever, chills, SOB, and chest pain.    Past medical history, Surgical history, Family history not pertinant except as noted below, Social history, Allergies, and medications have been entered into the medical record, reviewed, and corrections made.   Review of Systems: See HPI for pertinent positives and negatives.   Objective:    General: Speaking clearly in complete sentences without any shortness of breath.  Alert and oriented x3.  Normal judgment. No apparent acute distress.  Impression and Recommendations:    1. Dysuria 2. Acute left-sided thoracic back pain Unclear etiology. Symptoms suspicious for kidney stone but imaging last year did not find one. Getting UA with micro and culture for further evaluation. Discussed checking CBC and  CMP to evaluate for elevated WBCs or altered kidney function but this was just checked a few weeks ago and patient declined today. Sending in a short supply of hydrocodone to help manage the back pain. Increase fluids during the day. Further plan pending urine studies.  - Urinalysis, Routine w reflex microscopic - Urine Culture  I discussed the assessment and treatment plan with the patient. The patient was provided an opportunity to ask questions and all were answered. The patient agreed with the plan and demonstrated an understanding of the instructions.   The patient was advised to call back or seek an in-person evaluation if the symptoms worsen or if the condition fails to improve as anticipated.  20 minutes of non-face-to-face time was provided during this encounter.  Return if symptoms worsen or fail to improve.  Thayer Ohm, DNP, APRN, FNP-BC [facility]

## 2020-04-25 DIAGNOSIS — R300 Dysuria: Secondary | ICD-10-CM | POA: Diagnosis not present

## 2020-04-27 LAB — URINE CULTURE

## 2020-05-02 ENCOUNTER — Other Ambulatory Visit: Payer: Self-pay

## 2020-05-02 DIAGNOSIS — M7989 Other specified soft tissue disorders: Secondary | ICD-10-CM

## 2020-05-11 ENCOUNTER — Other Ambulatory Visit (HOSPITAL_COMMUNITY): Payer: Self-pay | Admitting: Physician Assistant

## 2020-05-11 ENCOUNTER — Ambulatory Visit (INDEPENDENT_AMBULATORY_CARE_PROVIDER_SITE_OTHER)
Admission: RE | Admit: 2020-05-11 | Discharge: 2020-05-11 | Disposition: A | Discharge status: Home / Self Care | Payer: BC Managed Care – PPO | Source: Ambulatory Visit | Attending: Medical-Surgical | Admitting: Medical-Surgical

## 2020-05-11 ENCOUNTER — Other Ambulatory Visit: Payer: Self-pay

## 2020-05-11 ENCOUNTER — Ambulatory Visit (HOSPITAL_COMMUNITY)
Admission: RE | Admit: 2020-05-11 | Discharge: 2020-05-11 | Disposition: A | Discharge status: Home / Self Care | Payer: BC Managed Care – PPO | Source: Ambulatory Visit | Attending: Vascular Surgery | Admitting: Vascular Surgery

## 2020-05-11 ENCOUNTER — Ambulatory Visit: Payer: BC Managed Care – PPO | Admitting: Physician Assistant

## 2020-05-11 ENCOUNTER — Ambulatory Visit (HOSPITAL_COMMUNITY)
Admission: RE | Admit: 2020-05-11 | Discharge: 2020-05-11 | Disposition: A | Discharge status: Home / Self Care | Payer: BC Managed Care – PPO | Source: Ambulatory Visit | Attending: Medical-Surgical | Admitting: Medical-Surgical

## 2020-05-11 VITALS — BP 131/91 | HR 99 | Temp 98.6°F | Resp 20 | Ht 65.0 in | Wt 128.0 lb

## 2020-05-11 DIAGNOSIS — R0989 Other specified symptoms and signs involving the circulatory and respiratory systems: Secondary | ICD-10-CM | POA: Diagnosis not present

## 2020-05-11 DIAGNOSIS — M79662 Pain in left lower leg: Secondary | ICD-10-CM

## 2020-05-11 DIAGNOSIS — I739 Peripheral vascular disease, unspecified: Secondary | ICD-10-CM

## 2020-05-11 DIAGNOSIS — M7989 Other specified soft tissue disorders: Secondary | ICD-10-CM

## 2020-05-11 NOTE — Progress Notes (Signed)
Office Note     CC:  follow up Requesting Provider:  Christen Butter, NP  HPI: Yesenia Harper is a 35 y.o. (1985/03/28) female who presents who was referred for evaluation of left lower extremity pain as well as edema specifically of left foot.  Over the past several weeks the symptoms have been worsening.  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  She denies any history of trauma, vascular intervention, or venous ulcerations.  She was diagnosed with a DVT of left lower extremity and was on Coumadin for period of time.  She believes the DVT may have been postpartum and was about 10 to 15 years ago.  She describes the pain in her left thigh however also gets cramping in her calf and hip when walking.  She believes that his left foot discomfort is is associated with edema which is worse toward the end of the day.  She has not worn compression or made an effort to elevate her legs during the day.  She denies tobacco use.  Family history significant for early onset cardiac disease on her father side.   Past Medical History:  Diagnosis Date  . Anxiety   . Anxiety with depression 08/03/2019  . Delivery of pregnancy by cesarean section   . Depression   . Hypothyroidism     Past Surgical History:  Procedure Laterality Date  . CESAREAN SECTION      Social History   Socioeconomic History  . Marital status: Married    Spouse name: Not on file  . Number of children: Not on file  . Years of education: Not on file  . Highest education level: Not on file  Occupational History  . Not on file  Tobacco Use  . Smoking status: Never Smoker  . Smokeless tobacco: Never Used  Substance and Sexual Activity  . Alcohol use: Not Currently  . Drug use: Never  . Sexual activity: Yes    Partners: Male    Birth control/protection: I.U.D.  Other Topics Concern  . Not on file  Social History Narrative  . Not on file   Social  Determinants of Health   Financial Resource Strain: Not on file  Food Insecurity: Not on file  Transportation Needs: Not on file  Physical Activity: Not on file  Stress: Not on file  Social Connections: Not on file  Intimate Partner Violence: Not on file    Family History  Problem Relation Age of Onset  . Hypertension Mother   . Diabetes Mother   . Hypertension Father   . Diabetes Father   . Diabetes Maternal Grandmother   . Hypertension Maternal Grandmother   . Stroke Maternal Grandmother   . Diabetes Maternal Grandfather   . Hypertension Maternal Grandfather   . Stroke Maternal Grandfather   . Diabetes Paternal Grandmother   . Hypertension Paternal Grandmother   . Stroke Paternal Grandmother   . Diabetes Paternal Grandfather   . Hypertension Paternal Grandfather   . Stroke Paternal Grandfather     Current Outpatient Medications  Medication Sig Dispense Refill  . ALPRAZolam (XANAX) 0.5 MG tablet Take 0.5 mg by mouth 3 (three) times daily as needed.    Marland Kitchen buPROPion (WELLBUTRIN XL) 150 MG 24 hr tablet Take 3 tablets (450 mg total) by mouth daily. **OFFICE VISIT MANDATORY BEFORE 08/07/2020 FOR FUTURE REFILLS** 270 tablet 1  . cyanocobalamin (,VITAMIN B-12,) 1000 MCG/ML injection Inject 1,000  mg into the muscle every 30 (thirty) days.    Marland Kitchen etonogestrel (NEXPLANON) 68 MG IMPL implant Inject into the skin.    Marland Kitchen levothyroxine (SYNTHROID) 75 MCG tablet Take 75 mcg by mouth daily.    Marland Kitchen LINZESS 290 MCG CAPS capsule Take 1 capsule by mouth.    . metoprolol succinate (TOPROL-XL) 25 MG 24 hr tablet Take 1 tablet by mouth as needed.    . Multiple Vitamins-Minerals (WOMENS MULTI VITAMIN & MINERAL) TABS Take 1 tablet by mouth daily.    . NURTEC 75 MG TBDP Take 1 tablet by mouth daily as needed.    . ondansetron (ZOFRAN-ODT) 8 MG disintegrating tablet Take 1 tablet (8 mg total) by mouth every 8 (eight) hours as needed for nausea. 20 tablet 0  . promethazine (PHENERGAN) 25 MG tablet Take 25  mg by mouth every 6 (six) hours as needed.     . topiramate (TOPAMAX) 200 MG tablet Take 200 mg by mouth 2 (two) times daily.     No current facility-administered medications for this visit.    Allergies  Allergen Reactions  . Penicillins Itching    Patient feels itching in mouth.     REVIEW OF SYSTEMS:   [X]  denotes positive finding, [ ]  denotes negative finding Cardiac  Comments:  Chest pain or chest pressure:    Shortness of breath upon exertion:    Short of breath when lying flat:    Irregular heart rhythm:        Vascular    Pain in calf, thigh, or hip brought on by ambulation:    Pain in feet at night that wakes you up from your sleep:     Blood clot in your veins:    Leg swelling:         Pulmonary    Oxygen at home:    Productive cough:     Wheezing:         Neurologic    Sudden weakness in arms or legs:     Sudden numbness in arms or legs:     Sudden onset of difficulty speaking or slurred speech:    Temporary loss of vision in one eye:     Problems with dizziness:         Gastrointestinal    Blood in stool:     Vomited blood:         Genitourinary    Burning when urinating:     Blood in urine:        Psychiatric    Major depression:         Hematologic    Bleeding problems:    Problems with blood clotting too easily:        Skin    Rashes or ulcers:        Constitutional    Fever or chills:      PHYSICAL EXAMINATION:  Vitals:   05/11/20 1032  BP: (!) 131/91  Pulse: 99  Resp: 20  Temp: 98.6 F (37 C)  TempSrc: Temporal  SpO2: 100%  Weight: 128 lb (58.1 kg)  Height: 5\' 5"  (1.651 m)    General:  WDWN in NAD; vital signs documented above Gait: Not observed HENT: WNL, normocephalic Pulmonary: normal non-labored breathing Cardiac: regular HR Abdomen: soft, NT, no masses Skin: without rashes Vascular Exam/Pulses:  Right Left  Radial 2+ (normal) 2+ (normal)  Popliteal 2+ (normal) absent  DP 2+ (normal) 1+ (weak)  PT 2+  (normal) absent  Extremities: No significant edema on exam left compared to right; diminished DP pulse on the left foot Musculoskeletal: no muscle wasting or atrophy  Neurologic: A&O X 3;  No focal weakness or paresthesias are detected Psychiatric:  The pt has Normal affect.   Non-Invasive Vascular Imaging:   ABIs are within normal limits with triphasic waveforms at the ankle; left TBI abnormal at 0.68  Venous reflux duplex negative for deep venous reflux and superficial venous reflux Also negative for DVT   ASSESSMENT/PLAN:: 35 y.o. female with left lower extremity pain with exertion as well as discomfort associated with left foot edema  -Left lower extremity venous reflux study did not demonstrate any significant deep or superficial venous reflux.  Duplex was also negative for DVT -Given her calf and hip cramping with ambulation as well as diminished pedal pulses on exam we did check an ABI today which was within normal limits with triphasic waveforms at the ankle; she did have an abnormal left TBI however this would not explain her symptoms. -Recommendations included fitting for knee-high 15 to 20 mmHg compression to be worn regularly, elevation of her legs periodically during the day, avoiding prolonged sitting and standing, and using NSAIDs for discomfort associated with edema -She may follow-up on an as-needed basis   Emilie Rutter, PA-C [facility] [phone]  Clinic MD:   Edilia Bo

## 2020-05-13 ENCOUNTER — Telehealth (INDEPENDENT_AMBULATORY_CARE_PROVIDER_SITE_OTHER): Payer: BC Managed Care – PPO | Admitting: Medical-Surgical

## 2020-05-13 ENCOUNTER — Encounter: Payer: Self-pay | Admitting: Medical-Surgical

## 2020-05-13 DIAGNOSIS — M7989 Other specified soft tissue disorders: Secondary | ICD-10-CM | POA: Diagnosis not present

## 2020-05-13 DIAGNOSIS — R202 Paresthesia of skin: Secondary | ICD-10-CM

## 2020-05-13 DIAGNOSIS — M79672 Pain in left foot: Secondary | ICD-10-CM | POA: Diagnosis not present

## 2020-05-13 DIAGNOSIS — R200 Anesthesia of skin: Secondary | ICD-10-CM | POA: Diagnosis not present

## 2020-05-13 NOTE — Progress Notes (Signed)
Virtual Visit via Video Note  I connected with Yesenia Harper on 05/13/20 at  3:00 PM EDT by a video enabled telemedicine application and verified that I am speaking with the correct person using two identifiers.   I discussed the limitations of evaluation and management by telemedicine and the availability of in person appointments. The patient expressed understanding and agreed to proceed.  Patient location: home Provider locations: office  Subjective:    CC: Left leg swelling follow-up  HPI: Pleasant 35 year old female presenting today for follow-up regarding her left leg swelling.  Notes that she continues to have swelling and pain in the left leg and foot.  Does have some tingling and numbness to her toes as well.  She was able to get in with vascular who did some further evaluation.  She was again negative for DVT in the left lower extremity.  They did ABIs and found her TBI to be 0.68 which is abnormal.  Pulses not palpable to the posterior tibialis or the popliteal and her dorsalis pedis pulse was weak.  Has difficulty with prolonged sitting or standing/walking.  Feels that her balance has been off some recently as well as her concentration.  She has been trying to walk some since it got warm outside but she is limited by the pain in her leg and foot.  She does have some numbness and tingling to the left hand and lower forearm.  Followed by neurology.  Her last head imaging was normal. No other neurological symptoms to indicate stroke. Has had nerve conductions studies in the past that were abnormal.   Past medical history, Surgical history, Family history not pertinant except as noted below, Social history, Allergies, and medications have been entered into the medical record, reviewed, and corrections made.   Review of Systems: See HPI for pertinent positives and negatives.   Objective:    General: Speaking clearly in complete sentences without any shortness of breath.  Alert and  oriented x3.  Normal judgment. No apparent acute distress.  Impression and Recommendations:    1. Left foot pain 2. Foot swelling 3. Numbness and tingling of left leg Unclear etiology. Reviewed notes and results from Vascular and discussed findings with patient. Concerned that her pulses were not palpable at the popliteal and PT areas and the DP was weak. She is wearing her compression stockings which helps some but wants to know what is causing such sudden swelling and paresthesias out of the blue. Consider possible abdominal venogram to evaluate for vessel constriction higher than the femoral level but since there has been so much imaging over the past year, holding off on that. Recommend a second opinion from vascular if she desires.   4. Left hand paresthesias Recommend checking BPs in both arms to make sure there are no large discrepancies to indicate vascular compromise. Possibly related to cervical radiculopathy. If persistent, may need to do nerve conduction studies.   I discussed the assessment and treatment plan with the patient. The patient was provided an opportunity to ask questions and all were answered. The patient agreed with the plan and demonstrated an understanding of the instructions.   The patient was advised to call back or seek an in-person evaluation if the symptoms worsen or if the condition fails to improve as anticipated.  20 minutes of non-face-to-face time was provided during this encounter.  Return if symptoms worsen or fail to improve.  Thayer Ohm, DNP, APRN, FNP-BC [facility]  Medicine

## 2020-05-16 ENCOUNTER — Other Ambulatory Visit: Payer: Self-pay | Admitting: Medical-Surgical

## 2020-05-16 DIAGNOSIS — R200 Anesthesia of skin: Secondary | ICD-10-CM

## 2020-05-16 DIAGNOSIS — M7989 Other specified soft tissue disorders: Secondary | ICD-10-CM

## 2020-05-16 DIAGNOSIS — R202 Paresthesia of skin: Secondary | ICD-10-CM

## 2020-05-26 DIAGNOSIS — G4733 Obstructive sleep apnea (adult) (pediatric): Secondary | ICD-10-CM | POA: Diagnosis not present

## 2020-06-07 DIAGNOSIS — N938 Other specified abnormal uterine and vaginal bleeding: Secondary | ICD-10-CM | POA: Diagnosis not present

## 2020-06-07 DIAGNOSIS — R102 Pelvic and perineal pain: Secondary | ICD-10-CM | POA: Diagnosis not present

## 2020-06-07 DIAGNOSIS — N83202 Unspecified ovarian cyst, left side: Secondary | ICD-10-CM | POA: Diagnosis not present

## 2020-06-13 DIAGNOSIS — K3184 Gastroparesis: Secondary | ICD-10-CM | POA: Diagnosis not present

## 2020-06-13 DIAGNOSIS — K30 Functional dyspepsia: Secondary | ICD-10-CM | POA: Diagnosis not present

## 2020-06-17 ENCOUNTER — Encounter: Payer: Self-pay | Admitting: Medical-Surgical

## 2020-06-17 DIAGNOSIS — I7789 Other specified disorders of arteries and arterioles: Secondary | ICD-10-CM | POA: Diagnosis not present

## 2020-06-17 DIAGNOSIS — I739 Peripheral vascular disease, unspecified: Secondary | ICD-10-CM | POA: Diagnosis not present

## 2020-06-17 DIAGNOSIS — M79605 Pain in left leg: Secondary | ICD-10-CM | POA: Diagnosis not present

## 2020-06-30 ENCOUNTER — Encounter: Payer: Self-pay | Admitting: Medical-Surgical

## 2020-07-01 ENCOUNTER — Ambulatory Visit (INDEPENDENT_AMBULATORY_CARE_PROVIDER_SITE_OTHER): Payer: BC Managed Care – PPO

## 2020-07-01 ENCOUNTER — Ambulatory Visit: Payer: BC Managed Care – PPO | Admitting: Medical-Surgical

## 2020-07-01 ENCOUNTER — Encounter: Payer: Self-pay | Admitting: Medical-Surgical

## 2020-07-01 ENCOUNTER — Other Ambulatory Visit: Payer: Self-pay

## 2020-07-01 VITALS — BP 118/81 | HR 91 | Temp 98.6°F | Ht 65.0 in | Wt 130.0 lb

## 2020-07-01 DIAGNOSIS — R5383 Other fatigue: Secondary | ICD-10-CM

## 2020-07-01 DIAGNOSIS — R0602 Shortness of breath: Secondary | ICD-10-CM | POA: Diagnosis not present

## 2020-07-01 DIAGNOSIS — R319 Hematuria, unspecified: Secondary | ICD-10-CM | POA: Diagnosis not present

## 2020-07-01 DIAGNOSIS — M549 Dorsalgia, unspecified: Secondary | ICD-10-CM | POA: Diagnosis not present

## 2020-07-01 DIAGNOSIS — M79605 Pain in left leg: Secondary | ICD-10-CM | POA: Diagnosis not present

## 2020-07-01 DIAGNOSIS — F329 Major depressive disorder, single episode, unspecified: Secondary | ICD-10-CM

## 2020-07-01 LAB — POCT URINALYSIS DIP (CLINITEK)
Bilirubin, UA: NEGATIVE
Glucose, UA: NEGATIVE mg/dL
Ketones, POC UA: NEGATIVE mg/dL
Leukocytes, UA: NEGATIVE
Nitrite, UA: NEGATIVE
POC PROTEIN,UA: NEGATIVE
Spec Grav, UA: 1.015 (ref 1.010–1.025)
Urobilinogen, UA: 0.2 E.U./dL
pH, UA: 6.5 (ref 5.0–8.0)

## 2020-07-01 MED ORDER — DULOXETINE HCL 30 MG PO CPEP: 30.0000 mg | ORAL_CAPSULE | Freq: Every day | ORAL | 1 refills | Status: DC

## 2020-07-01 NOTE — Progress Notes (Signed)
Subjective:    CC: Recurring back pain  HPI: Pleasant 35 year old female with a complicated medical history over the past few years presenting today for recurring back pain.  She has had several episodes of back pain that affects the left flank as well as the mid thoracic spine extending from the middle scapular area down to the lower thoracic.  She notes that the pain is intermittent but when it hits, it is described as "sharp but dull" and is as severe as labor pains.  Standing is not helpful and she notes that bending and sitting are nearly impossible when in that much pain.  She has been using headache powders, ibuprofen, and Tylenol but none are very helpful.  She has had several scans for potential kidney stones or other possible cause but none have been identified to date.  She does have blood in her urine and this has been present on several occasions.  She was referred to urology but unfortunately scheduling fell through.  She would like to have a different referral today to get her in touch with a different urologist.  Has been followed by cardiology, neurology, pulmonology, and vascular most recently.  She does have an appointment coming up for an MRI that vascular ordered to evaluate popliteal vein patency.  Recently had an ultrasound that showed some incompetence of the great saphenous vein and a possible chronic occlusion.  She is wondering if she should go ahead with having the MRI and considering the procedure.  Has been having difficulty with her mood recently.  She is taking Wellbutrin and tolerating it well but feels that its not helping very much with her most recent issues.  She does get quite tearful at times, especially since she is very limited in her ability to interact and go about her daily life.  She has not been able to exercise like she used to and this was a significant release of anxiety and stress for her.  She is at a point where she believes she may need to consider  other options.  I reviewed the past medical history, family history, social history, surgical history, and allergies today and no changes were needed.  Please see the problem list section below in epic for further details.  Past Medical History: Past Medical History:  Diagnosis Date   Anxiety    Anxiety with depression 08/03/2019   Delivery of pregnancy by cesarean section    Depression    Hypothyroidism    Past Surgical History: Past Surgical History:  Procedure Laterality Date   CESAREAN SECTION     Social History: Social History   Socioeconomic History   Marital status: Married    Spouse name: Not on file   Number of children: Not on file   Years of education: Not on file   Highest education level: Not on file  Occupational History   Not on file  Tobacco Use   Smoking status: Never   Smokeless tobacco: Never  Substance and Sexual Activity   Alcohol use: Not Currently   Drug use: Never   Sexual activity: Yes    Partners: Male    Birth control/protection: I.U.D.  Other Topics Concern   Not on file  Social History Narrative   Not on file   Social Determinants of Health   Financial Resource Strain: Not on file  Food Insecurity: Not on file  Transportation Needs: Not on file  Physical Activity: Not on file  Stress: Not on file  Social Connections:  Not on file   Family History: Family History  Problem Relation Age of Onset   Hypertension Mother    Diabetes Mother    Hypertension Father    Diabetes Father    Diabetes Maternal Grandmother    Hypertension Maternal Grandmother    Stroke Maternal Grandmother    Diabetes Maternal Grandfather    Hypertension Maternal Grandfather    Stroke Maternal Grandfather    Diabetes Paternal Grandmother    Hypertension Paternal Grandmother    Stroke Paternal Grandmother    Diabetes Paternal Grandfather    Hypertension Paternal Grandfather    Stroke Paternal Grandfather    Allergies: Allergies  Allergen Reactions    Penicillins Itching and Rash    Patient feels itching in mouth. Patient feels itching in mouth.   Medications: See med rec.  Review of Systems: See HPI for pertinent positives and negatives.   Objective:    General: Well Developed, well nourished, and in no acute distress.  Neuro: Alert and oriented x3.  HEENT: Normocephalic, atraumatic.  Skin: Warm and dry. Cardiac: Regular rate and rhythm, no murmurs rubs or gallops, no lower extremity edema.  Respiratory: Clear to auscultation bilaterally. Not using accessory muscles, speaking in full sentences. MSK: Unable to reproduce thoracic back pain in office with palpation or position changes.  Impression and Recommendations:    1. Back pain, unspecified back location, unspecified back pain laterality, unspecified chronicity POCT UA negative other than moderate blood.  Sending for culture.  Checking CBC with differential. - POCT URINALYSIS DIP (CLINITEK) - Urine Culture - CBC with Differential/Platelet  2. Hematuria, unspecified type Referring to urology.  Checking CBC with differential. - Ambulatory referral to Urology - CBC with Differential/Platelet  3. Fatigue, unspecified type Checking CMP, TSH, vitamin D, and iron panel.  We will go ahead and get a chest x-ray since she does continue to have shortness of breath. - COMPLETE METABOLIC PANEL WITH GFR - TSH - VITAMIN D 25 Hydroxy (Vit-D Deficiency, Fractures) - DG Chest 2 View; Future - Fe+TIBC+Fer  4. Pain of left lower extremity Checking vitamin D.  Encourage patient to continue with plan for MRI and possible intervention.  At her age, if there is anything that may help her symptoms, I would strongly recommend pursuing it so that she can resume some quality of life. - VITAMIN D 25 Hydroxy (Vit-D Deficiency, Fractures)  5.  Reactive depression Continue Wellbutrin 150 mg daily.  Discussed various options for treatment but feel that she may benefit from an SNRI in both mood and  body aches and pains.  Starting Cymbalta 30 mg nightly.  We will follow-up via MyChart in 2 weeks due to her hectic work schedule as well as frequent follow-up requirements.  Return if symptoms worsen or fail to improve. ___________________________________________ Thayer Ohm, DNP, APRN, FNP-BC Primary Care and Sports Medicine [facility]

## 2020-07-02 DIAGNOSIS — I7789 Other specified disorders of arteries and arterioles: Secondary | ICD-10-CM | POA: Diagnosis not present

## 2020-07-02 LAB — COMPLETE METABOLIC PANEL WITH GFR
AG Ratio: 2 (calc) (ref 1.0–2.5)
ALT: 21 U/L (ref 6–29)
AST: 14 U/L (ref 10–30)
Albumin: 4.4 g/dL (ref 3.6–5.1)
Alkaline phosphatase (APISO): 43 U/L (ref 31–125)
BUN: 13 mg/dL (ref 7–25)
CO2: 27 mmol/L (ref 20–32)
Calcium: 9.1 mg/dL (ref 8.6–10.2)
Chloride: 108 mmol/L (ref 98–110)
Creat: 1.03 mg/dL (ref 0.50–1.10)
GFR, Est African American: 83 mL/min/{1.73_m2} (ref >=60)
GFR, Est Non African American: 71 mL/min/{1.73_m2} (ref >=60)
Globulin: 2.2 g/dL (calc) (ref 1.9–3.7)
Glucose, Bld: 76 mg/dL (ref 65–99)
Potassium: 4 mmol/L (ref 3.5–5.3)
Sodium: 140 mmol/L (ref 135–146)
Total Bilirubin: 0.3 mg/dL (ref 0.2–1.2)
Total Protein: 6.6 g/dL (ref 6.1–8.1)

## 2020-07-02 LAB — TSH: TSH: 1.56 mIU/L

## 2020-07-02 LAB — IRON,TIBC AND FERRITIN PANEL
%SAT: 29 % (calc) (ref 16–45)
Ferritin: 31 ng/mL (ref 16–154)
Iron: 70 ug/dL (ref 40–190)
TIBC: 243 mcg/dL (calc) — ABNORMAL LOW (ref 250–450)

## 2020-07-02 LAB — CBC WITH DIFFERENTIAL/PLATELET
Absolute Monocytes: 580 cells/uL (ref 200–950)
Basophils Absolute: 50 cells/uL (ref 0–200)
Basophils Relative: 0.8 %
Eosinophils Absolute: 63 cells/uL (ref 15–500)
Eosinophils Relative: 1 %
HCT: 38.1 % (ref 35.0–45.0)
Hemoglobin: 12.8 g/dL (ref 11.7–15.5)
Lymphs Abs: 2942 cells/uL (ref 850–3900)
MCH: 31.1 pg (ref 27.0–33.0)
MCHC: 33.6 g/dL (ref 32.0–36.0)
MCV: 92.5 fL (ref 80.0–100.0)
MPV: 9 fL (ref 7.5–12.5)
Monocytes Relative: 9.2 %
Neutro Abs: 2665 cells/uL (ref 1500–7800)
Neutrophils Relative %: 42.3 %
Platelets: 302 10*3/uL (ref 140–400)
RBC: 4.12 10*6/uL (ref 3.80–5.10)
RDW: 11.5 % (ref 11.0–15.0)
Total Lymphocyte: 46.7 %
WBC: 6.3 10*3/uL (ref 3.8–10.8)

## 2020-07-02 LAB — VITAMIN D 25 HYDROXY (VIT D DEFICIENCY, FRACTURES): Vit D, 25-Hydroxy: 32 ng/mL (ref 30–100)

## 2020-07-03 LAB — URINE CULTURE
MICRO NUMBER:: 12049653
SPECIMEN QUALITY:: ADEQUATE

## 2020-07-05 DIAGNOSIS — Z86718 Personal history of other venous thrombosis and embolism: Secondary | ICD-10-CM

## 2020-07-05 DIAGNOSIS — M79605 Pain in left leg: Secondary | ICD-10-CM | POA: Insufficient documentation

## 2020-07-05 DIAGNOSIS — M25562 Pain in left knee: Secondary | ICD-10-CM | POA: Insufficient documentation

## 2020-07-05 HISTORY — DX: Personal history of other venous thrombosis and embolism: Z86.718

## 2020-07-05 HISTORY — DX: Pain in left knee: M25.562

## 2020-07-05 HISTORY — DX: Pain in left leg: M79.605

## 2020-07-06 ENCOUNTER — Encounter: Payer: Self-pay | Admitting: Medical-Surgical

## 2020-07-13 DIAGNOSIS — J018 Other acute sinusitis: Secondary | ICD-10-CM | POA: Diagnosis not present

## 2020-07-15 ENCOUNTER — Encounter: Payer: Self-pay | Admitting: Medical-Surgical

## 2020-07-18 DIAGNOSIS — I871 Compression of vein: Secondary | ICD-10-CM | POA: Diagnosis not present

## 2020-07-18 DIAGNOSIS — M79605 Pain in left leg: Secondary | ICD-10-CM | POA: Diagnosis not present

## 2020-07-18 DIAGNOSIS — I739 Peripheral vascular disease, unspecified: Secondary | ICD-10-CM | POA: Diagnosis not present

## 2020-07-18 DIAGNOSIS — I7789 Other specified disorders of arteries and arterioles: Secondary | ICD-10-CM | POA: Diagnosis not present

## 2020-07-20 ENCOUNTER — Ambulatory Visit: Payer: BC Managed Care – PPO | Admitting: Vascular Surgery

## 2020-07-20 ENCOUNTER — Encounter: Payer: Self-pay | Admitting: Vascular Surgery

## 2020-07-20 ENCOUNTER — Other Ambulatory Visit: Payer: Self-pay

## 2020-07-20 VITALS — BP 126/85 | HR 72 | Temp 98.4°F | Resp 18 | Ht 61.0 in | Wt 130.8 lb

## 2020-07-20 DIAGNOSIS — M79662 Pain in left lower leg: Secondary | ICD-10-CM

## 2020-07-20 NOTE — Progress Notes (Signed)
Referring Physician: Christen Butter, NP  Patient name: Yesenia Harper MRN: [id number] DOB: [birth year] Sex: female  REASON FOR CONSULT: Left leg pain  HPI: Yesenia Harper is a 35 y.o. female, with several month history of slowly worsening pain in the left calf with ambulation.  This is worse on walking on inclined level.  She experiences pain that occurs in the left calf and then does improve some after rest but is sore usually the rest of the day.  She sometimes has pain in the leg first thing in the morning after getting out of bed.  She does develop some foot and ankle swelling.  She previously had a venous duplex exam here which did not really show any significant reflux.  She had a venous duplex performed at [facility] and I reviewed the results of this which showed a short segment of accessory saphenous vein.  The left greater saphenous vein was occluded.  She has no prior history of DVT.  She has no family history of similar symptoms.  She does not smoke.  She does have a history of some scoliosis and C-spine degeneration.  This has been previously evaluated by neurology and not thought to be related to her leg problems.  She also has migraine headaches.  She does not have any numbness or tingling in her feet.  She also had prior ABIs in our office which were triphasic greater than 1 and essentially normal bilaterally.  She did have a slightly decreased left toe pressure.  Past Medical History:  Diagnosis Date   Anxiety    Anxiety with depression 08/03/2019   Delivery of pregnancy by cesarean section    Depression    Hypothyroidism    Past Surgical History:  Procedure Laterality Date   CESAREAN SECTION      Family History  Problem Relation Age of Onset   Hypertension Mother    Diabetes Mother    Hypertension Father    Diabetes Father    Diabetes Maternal Grandmother    Hypertension Maternal Grandmother    Stroke Maternal Grandmother    Diabetes Maternal Grandfather     Hypertension Maternal Grandfather    Stroke Maternal Grandfather    Diabetes Paternal Grandmother    Hypertension Paternal Grandmother    Stroke Paternal Grandmother    Diabetes Paternal Grandfather    Hypertension Paternal Grandfather    Stroke Paternal Grandfather     SOCIAL HISTORY: Social History   Socioeconomic History   Marital status: Married    Spouse name: Not on file   Number of children: Not on file   Years of education: Not on file   Highest education level: Not on file  Occupational History   Not on file  Tobacco Use   Smoking status: Never   Smokeless tobacco: Never  Substance and Sexual Activity   Alcohol use: Not Currently   Drug use: Never   Sexual activity: Yes    Partners: Male    Birth control/protection: I.U.D.  Other Topics Concern   Not on file  Social History Narrative   Not on file   Social Determinants of Health   Financial Resource Strain: Not on file  Food Insecurity: Not on file  Transportation Needs: Not on file  Physical Activity: Not on file  Stress: Not on file  Social Connections: Not on file  Intimate Partner Violence: Not on file    Allergies  Allergen Reactions   Penicillins Itching and Rash    Patient feels  itching in mouth. Patient feels itching in mouth.    Current Outpatient Medications  Medication Sig Dispense Refill   ALPRAZolam (XANAX) 0.5 MG tablet Take 0.5 mg by mouth 3 (three) times daily as needed.     buPROPion (WELLBUTRIN XL) 150 MG 24 hr tablet Take 3 tablets (450 mg total) by mouth daily. **OFFICE VISIT MANDATORY BEFORE 08/07/2020 FOR FUTURE REFILLS** 270 tablet 1   cyanocobalamin (,VITAMIN B-12,) 1000 MCG/ML injection Inject 1,000 mg into the muscle every 30 (thirty) days.     DULoxetine (CYMBALTA) 30 MG capsule Take 1 capsule (30 mg total) by mouth daily. 30 capsule 1   etonogestrel (NEXPLANON) 68 MG IMPL implant Inject into the skin.     levothyroxine (SYNTHROID) 75 MCG tablet Take 75 mcg by mouth daily.      LINZESS 290 MCG CAPS capsule Take 1 capsule by mouth.     metoprolol succinate (TOPROL-XL) 25 MG 24 hr tablet Take 1 tablet by mouth as needed.     Multiple Vitamins-Minerals (WOMENS MULTI VITAMIN & MINERAL) TABS Take 1 tablet by mouth daily.     NURTEC 75 MG TBDP Take 1 tablet by mouth daily as needed.     ondansetron (ZOFRAN-ODT) 8 MG disintegrating tablet Take 1 tablet (8 mg total) by mouth every 8 (eight) hours as needed for nausea. 20 tablet 0   promethazine (PHENERGAN) 25 MG tablet Take 25 mg by mouth every 6 (six) hours as needed.      topiramate (TOPAMAX) 200 MG tablet Take 200 mg by mouth 2 (two) times daily.     No current facility-administered medications for this visit.    ROS:   General:  No weight loss, Fever, chills  HEENT: No recent headaches, no nasal bleeding, no visual changes, no sore throat  Neurologic: No dizziness, blackouts, seizures. No recent symptoms of stroke or mini- stroke. No recent episodes of slurred speech, or temporary blindness.  Cardiac: No recent episodes of chest pain/pressure, no shortness of breath at rest.  No shortness of breath with exertion.  Denies history of atrial fibrillation or irregular heartbeat  Vascular: No history of rest pain in feet.  No history of claudication.  No history of non-healing ulcer, No history of DVT   Pulmonary: No home oxygen, no productive cough, no hemoptysis,  No asthma or wheezing  Musculoskeletal:  [ ]  Arthritis, [ ]  Low back pain,  [ ]  Joint pain  Hematologic:No history of hypercoagulable state.  No history of easy bleeding.  No history of anemia  Gastrointestinal: No hematochezia or melena,  No gastroesophageal reflux, no trouble swallowing  Urinary: [ ]  chronic Kidney disease, [ ]  on HD - [ ]  MWF or [ ]  TTHS, [ ]  Burning with urination, [ ]  Frequent urination, [ ]  Difficulty urinating;   Skin: No rashes  Psychological: No history of anxiety,  No history of depression   Physical  Examination  Vitals:   07/20/20 0905  BP: 126/85  Pulse: 72  Resp: 18  Temp: 98.4 F (36.9 C)  TempSrc: Temporal  SpO2: 99%  Weight: 130 lb 12.8 oz (59.3 kg)  Height: 5\' 1"  (1.549 m)    Body mass index is 24.71 kg/m.  General:  Alert and oriented, no acute distress HEENT: Normal Neck: No JVD Cardiac: Regular Rate and Rhythm Skin: No rash no significant surface varicosities Extremity Pulses:  2+ right absent left dorsalis pedis, absent posterior tibial pulses bilaterally Musculoskeletal: No deformity or edema  Neurologic: Upper and lower extremity  motor 5/5 and symmetric  DATA:  I reviewed the patient's ABIs and venous duplex as mentioned per history of present illness.  I reviewed interpreted those findings.  ASSESSMENT: Left leg pain symptoms primarily with exercise.  This does not seem to be related to venous disease.  She had minimal reflux on her exam and her greater saphenous vein was not seen or visualized on a [employer] duplex exam.  Although she did have normal ABIs.  I have some concerns that she might have popliteal entrapment type syndrome in the left leg.  PLAN: MRA bilateral lower extremities to rule out popliteal entrapment.  Patient will follow up with me in a few weeks after her MRI exam.   Fabienne Bruns, MD [facility] Office: [phone]

## 2020-07-21 ENCOUNTER — Other Ambulatory Visit: Payer: Self-pay

## 2020-07-21 DIAGNOSIS — M79662 Pain in left lower leg: Secondary | ICD-10-CM

## 2020-07-21 DIAGNOSIS — M7989 Other specified soft tissue disorders: Secondary | ICD-10-CM

## 2020-07-24 ENCOUNTER — Other Ambulatory Visit: Payer: Self-pay | Admitting: Medical-Surgical

## 2020-07-27 ENCOUNTER — Other Ambulatory Visit: Payer: Self-pay

## 2020-07-27 DIAGNOSIS — R0989 Other specified symptoms and signs involving the circulatory and respiratory systems: Secondary | ICD-10-CM

## 2020-07-27 DIAGNOSIS — I739 Peripheral vascular disease, unspecified: Secondary | ICD-10-CM

## 2020-07-27 DIAGNOSIS — M79662 Pain in left lower leg: Secondary | ICD-10-CM

## 2020-07-27 DIAGNOSIS — M7989 Other specified soft tissue disorders: Secondary | ICD-10-CM

## 2020-07-29 ENCOUNTER — Other Ambulatory Visit: Payer: BC Managed Care – PPO

## 2020-08-03 ENCOUNTER — Other Ambulatory Visit: Payer: Self-pay

## 2020-08-03 ENCOUNTER — Ambulatory Visit (HOSPITAL_COMMUNITY)
Admission: RE | Admit: 2020-08-03 | Discharge: 2020-08-03 | Disposition: A | Discharge status: Home / Self Care | Payer: BC Managed Care – PPO | Source: Ambulatory Visit | Attending: Vascular Surgery | Admitting: Vascular Surgery

## 2020-08-03 DIAGNOSIS — M7989 Other specified soft tissue disorders: Secondary | ICD-10-CM | POA: Insufficient documentation

## 2020-08-03 DIAGNOSIS — I878 Other specified disorders of veins: Secondary | ICD-10-CM | POA: Diagnosis not present

## 2020-08-03 DIAGNOSIS — N838 Other noninflammatory disorders of ovary, fallopian tube and broad ligament: Secondary | ICD-10-CM | POA: Diagnosis not present

## 2020-08-03 DIAGNOSIS — M79662 Pain in left lower leg: Secondary | ICD-10-CM | POA: Insufficient documentation

## 2020-08-04 ENCOUNTER — Ambulatory Visit (INDEPENDENT_AMBULATORY_CARE_PROVIDER_SITE_OTHER): Payer: BC Managed Care – PPO | Admitting: Vascular Surgery

## 2020-08-04 ENCOUNTER — Encounter: Payer: Self-pay | Admitting: Vascular Surgery

## 2020-08-04 VITALS — BP 132/90 | HR 78 | Temp 98.4°F | Resp 16 | Ht 61.0 in | Wt 131.0 lb

## 2020-08-04 DIAGNOSIS — M79662 Pain in left lower leg: Secondary | ICD-10-CM | POA: Diagnosis not present

## 2020-08-04 NOTE — Progress Notes (Signed)
Patient is a 35 year old female who returns for follow-up today.  She was seen last on July 20, 2020 with complaints of left leg pain after ambulation.  She continues to have pain in her left leg that is brought on after about 30 minutes of activity.  This is worse on walking on incline.  She states that she also feels like she is losing some balance in her left leg because this tends to give way after walking.  She previously had a venous duplex exam which did not really show any significant reflux and similar findings at [facility].  She has previously had normal ABIs.  She states that she does get some fullness and aching in her pelvis but it is more related to aching that goes from the hip all the way down to the foot.  She does not really complain of dyspareunia.  She returns today after recent MRI exam to pursue work-up of possible popliteal entrapment syndrome.  Physical exam:  Vitals:   08/04/20 0913  BP: 132/90  Pulse: 78  Resp: 16  Temp: 98.4 F (36.9 C)  SpO2: 99%  Weight: 131 lb (59.4 kg)  Height: 5\' 1"  (1.549 m)    Neuro: I observed the patient's gait while ambulating.  She does not really have any significant gait disturbance.  Data: I reviewed the patient's MRI exam which showed no evidence of intimal injury or narrowing of the popliteal artery.  This was an MRA protocol.  Unfortunately be muscle insertions are not well defined on this.  I discussed the findings of the MRI exam with Dr. from interventional radiology.  The patient did also have some symptoms on MRI scan of pelvic congestion with dilation of veins in the left pelvis.  Although all of her symptoms do not really seem to relate to this.  I reviewed all of the images of the MRI scan with the patient and her husband today.  Assessment: Left leg pain primarily with ambulation.  After discussions with the patient her husband and Dr. Greggory Stallion the neck step to definitively rule out popliteal entrapment would  be a specific CT protocol with some exercise prior to this.  We will try to make sure that the specific protocols are set up by Dr. Greggory Stallion for the day of her testing.  If this is negative then I have discussed with Dr. Greggory Stallion potentially referring her to him to look at potentially pelvic congestion syndrome as a next possible etiology of her leg before really getting this to musculoskeletal problems.  Plan: Patient will follow up with me after her pending CT scan.  Greggory Stallion, MD [facility] Office: [phone]

## 2020-08-05 ENCOUNTER — Encounter: Payer: Self-pay | Admitting: Medical-Surgical

## 2020-08-09 ENCOUNTER — Other Ambulatory Visit: Payer: Self-pay

## 2020-08-09 DIAGNOSIS — M79662 Pain in left lower leg: Secondary | ICD-10-CM

## 2020-08-09 DIAGNOSIS — I739 Peripheral vascular disease, unspecified: Secondary | ICD-10-CM

## 2020-08-09 DIAGNOSIS — R0989 Other specified symptoms and signs involving the circulatory and respiratory systems: Secondary | ICD-10-CM

## 2020-08-16 ENCOUNTER — Encounter (HOSPITAL_COMMUNITY): Payer: Self-pay

## 2020-08-16 ENCOUNTER — Ambulatory Visit (HOSPITAL_COMMUNITY)
Admission: RE | Admit: 2020-08-16 | Discharge: 2020-08-16 | Disposition: A | Discharge status: Home / Self Care | Payer: BC Managed Care – PPO | Source: Ambulatory Visit | Attending: Vascular Surgery | Admitting: Vascular Surgery

## 2020-08-16 ENCOUNTER — Other Ambulatory Visit: Payer: Self-pay

## 2020-08-16 DIAGNOSIS — I739 Peripheral vascular disease, unspecified: Secondary | ICD-10-CM | POA: Diagnosis not present

## 2020-08-16 DIAGNOSIS — M47816 Spondylosis without myelopathy or radiculopathy, lumbar region: Secondary | ICD-10-CM | POA: Diagnosis not present

## 2020-08-16 DIAGNOSIS — M79662 Pain in left lower leg: Secondary | ICD-10-CM | POA: Diagnosis not present

## 2020-08-16 DIAGNOSIS — M4696 Unspecified inflammatory spondylopathy, lumbar region: Secondary | ICD-10-CM | POA: Diagnosis not present

## 2020-08-16 DIAGNOSIS — R0989 Other specified symptoms and signs involving the circulatory and respiratory systems: Secondary | ICD-10-CM

## 2020-08-16 DIAGNOSIS — Z0389 Encounter for observation for other suspected diseases and conditions ruled out: Secondary | ICD-10-CM | POA: Diagnosis not present

## 2020-08-16 LAB — POCT I-STAT CREATININE: Creatinine, Ser: 0.7 mg/dL (ref 0.44–1.00)

## 2020-08-16 MED ORDER — IOHEXOL 350 MG/ML SOLN
100.0000 mL | Freq: Once | INTRAVENOUS | Status: AC | PRN
  Administered 2020-08-16: 100 mL via INTRAVENOUS

## 2020-08-17 ENCOUNTER — Other Ambulatory Visit: Payer: Self-pay | Admitting: Vascular Surgery

## 2020-08-17 ENCOUNTER — Telehealth (INDEPENDENT_AMBULATORY_CARE_PROVIDER_SITE_OTHER): Payer: BC Managed Care – PPO | Admitting: Vascular Surgery

## 2020-08-17 DIAGNOSIS — M79662 Pain in left lower leg: Secondary | ICD-10-CM

## 2020-08-17 DIAGNOSIS — N9489 Other specified conditions associated with female genital organs and menstrual cycle: Secondary | ICD-10-CM

## 2020-08-17 NOTE — Telephone Encounter (Signed)
Discussed with patient this morning her results of her CT scan which did not show any anatomic variants or insertions of her gastrocnemius muscle and no intimal defects of her popliteal artery.  I believe we have fairly extensively evaluated her for popliteal entrapment and not really found any cause for her left leg symptoms.  Although we could consider doing an arteriogram I feel the images we obtained from the CT angio have almost completely ruled out popliteal entrapment.  At this point I think we should pursue other possible etiologies of her pain and could certainly revisit popliteal entrapment if no other etiology is found.  I am going to refer her to Dr. Brunetta Jeans for consideration of treatment of her possible pelvic congestion syndrome.  She did have some degenerative spine changes but states that she has discussed this with her neurology providers before and they felt there was fairly minimal disease.  She will follow-up with [place] on an as-needed basis.  We will cancel her office visit appointment tomorrow.  Fabienne Bruns, MD [facility] [place] Office: [phone]

## 2020-08-18 ENCOUNTER — Ambulatory Visit: Payer: BC Managed Care – PPO | Admitting: Vascular Surgery

## 2020-08-18 ENCOUNTER — Encounter: Payer: Self-pay | Admitting: Medical-Surgical

## 2020-08-19 ENCOUNTER — Other Ambulatory Visit: Payer: Self-pay

## 2020-08-19 ENCOUNTER — Emergency Department (HOSPITAL_COMMUNITY)
Admission: EM | Admit: 2020-08-19 | Discharge: 2020-08-19 | Disposition: A | Discharge status: Home / Self Care | Payer: BC Managed Care – PPO | Attending: Emergency Medicine | Admitting: Emergency Medicine

## 2020-08-19 ENCOUNTER — Encounter (HOSPITAL_COMMUNITY): Payer: Self-pay | Admitting: Emergency Medicine

## 2020-08-19 ENCOUNTER — Emergency Department (HOSPITAL_COMMUNITY): Payer: BC Managed Care – PPO

## 2020-08-19 DIAGNOSIS — Z79899 Other long term (current) drug therapy: Secondary | ICD-10-CM | POA: Diagnosis not present

## 2020-08-19 DIAGNOSIS — N898 Other specified noninflammatory disorders of vagina: Secondary | ICD-10-CM | POA: Insufficient documentation

## 2020-08-19 DIAGNOSIS — K5790 Diverticulosis of intestine, part unspecified, without perforation or abscess without bleeding: Secondary | ICD-10-CM | POA: Diagnosis not present

## 2020-08-19 DIAGNOSIS — R109 Unspecified abdominal pain: Secondary | ICD-10-CM

## 2020-08-19 DIAGNOSIS — R10A2 Flank pain, left side: Secondary | ICD-10-CM

## 2020-08-19 DIAGNOSIS — E039 Hypothyroidism, unspecified: Secondary | ICD-10-CM | POA: Diagnosis not present

## 2020-08-19 DIAGNOSIS — N3289 Other specified disorders of bladder: Secondary | ICD-10-CM | POA: Diagnosis not present

## 2020-08-19 LAB — URINALYSIS, ROUTINE W REFLEX MICROSCOPIC
Bilirubin Urine: NEGATIVE
Glucose, UA: NEGATIVE mg/dL
Ketones, ur: NEGATIVE mg/dL
Nitrite: NEGATIVE
Protein, ur: NEGATIVE mg/dL
Specific Gravity, Urine: 1.01 (ref 1.005–1.030)
pH: 5 (ref 5.0–8.0)

## 2020-08-19 LAB — CBC
HCT: 38 % (ref 36.0–46.0)
Hemoglobin: 12.4 g/dL (ref 12.0–15.0)
MCH: 30.9 pg (ref 26.0–34.0)
MCHC: 32.6 g/dL (ref 30.0–36.0)
MCV: 94.8 fL (ref 80.0–100.0)
Platelets: 278 10*3/uL (ref 150–400)
RBC: 4.01 MIL/uL (ref 3.87–5.11)
RDW: 11.9 % (ref 11.5–15.5)
WBC: 5.9 10*3/uL (ref 4.0–10.5)
nRBC: 0 % (ref 0.0–0.2)

## 2020-08-19 LAB — COMPREHENSIVE METABOLIC PANEL
ALT: 24 U/L (ref 0–44)
AST: 16 U/L (ref 15–41)
Albumin: 4.2 g/dL (ref 3.5–5.0)
Alkaline Phosphatase: 45 U/L (ref 38–126)
Anion gap: 6 (ref 5–15)
BUN: 10 mg/dL (ref 6–20)
CO2: 23 mmol/L (ref 22–32)
Calcium: 9.1 mg/dL (ref 8.9–10.3)
Chloride: 109 mmol/L (ref 98–111)
Creatinine, Ser: 0.88 mg/dL (ref 0.44–1.00)
GFR, Estimated: >60 mL/min (ref >60)
Glucose, Bld: 80 mg/dL (ref 70–99)
Potassium: 3.4 mmol/L — ABNORMAL LOW (ref 3.5–5.1)
Sodium: 138 mmol/L (ref 135–145)
Total Bilirubin: 0.5 mg/dL (ref 0.3–1.2)
Total Protein: 6.8 g/dL (ref 6.5–8.1)

## 2020-08-19 LAB — WET PREP, GENITAL
Clue Cells Wet Prep HPF POC: NONE SEEN
Sperm: NONE SEEN
Trich, Wet Prep: NONE SEEN
Yeast Wet Prep HPF POC: NONE SEEN

## 2020-08-19 LAB — I-STAT BETA HCG BLOOD, ED (MC, WL, AP ONLY): I-stat hCG, quantitative: <5 m[IU]/mL (ref <5)

## 2020-08-19 MED ORDER — OXYCODONE-ACETAMINOPHEN 5-325 MG PO TABS
2.0000 | ORAL_TABLET | Freq: Once | ORAL | Status: AC
  Administered 2020-08-19: 2 via ORAL
  Filled 2020-08-19: qty 2

## 2020-08-19 MED ORDER — IBUPROFEN 800 MG PO TABS
800.0000 mg | ORAL_TABLET | Freq: Once | ORAL | Status: AC
  Administered 2020-08-19: 800 mg via ORAL
  Filled 2020-08-19: qty 1

## 2020-08-19 MED ORDER — ONDANSETRON 4 MG PO TBDP
4.0000 mg | ORAL_TABLET | Freq: Once | ORAL | Status: AC
  Administered 2020-08-19: 4 mg via ORAL
  Filled 2020-08-19: qty 1

## 2020-08-19 NOTE — ED Triage Notes (Signed)
Pt here from work with c/o left side pain , pt is being r/o for pelvic congestion syndrome

## 2020-08-19 NOTE — ED Provider Notes (Signed)
[facility] EMERGENCY DEPARTMENT Provider Note   CSN: [id number] Arrival date & time: 08/19/20  1117     History Chief Complaint  Patient presents with   Abdominal Pain    Shelbi Vaccaro is a 35 y.o. female.  HPI 35 year old female G2 P2 LMP this month on birth control presents complaining of left flank pain.  Reports this began today.  There is radiating into the left pelvis area.  It is worse with sitting and standing.  She has had some associated nausea.  Denies any vomiting, fever, chills.  She is noted some blood intermittently in her toilet bowl when urinating but is unclear whether this is come secondary to vaginal bleeding or urinary tract.  Not noted any rectal bleeding.  She has had some left calf pain with extensive work-up by Dr. [name] for popliteal entrapment syndrome.  Previously had some differential noted on her ABIs.  She has had a CT angio of the abdomen and pelvis which showed engorgement of the left ovarian vein and prominence of the periuterine veins as could be seen with pelvic venous disorder.  However today is the first time that she has really noted abdominal or pelvic pain.     Past Medical History:  Diagnosis Date   Anxiety    Anxiety with depression 08/03/2019   Delivery of pregnancy by cesarean section    Depression    Hypothyroidism     Patient Active Problem List   Diagnosis Date Noted   Depression    Delivery of pregnancy by cesarean section    Anxiety    Hypothyroidism 08/03/2019   Dry cough 08/03/2019   Anxiety with depression 08/03/2019    Past Surgical History:  Procedure Laterality Date   CESAREAN SECTION       OB History   No obstetric history on file.     Family History  Problem Relation Age of Onset   Hypertension Mother    Diabetes Mother    Hypertension Father    Diabetes Father    Diabetes Maternal Grandmother    Hypertension Maternal Grandmother    Stroke Maternal Grandmother    Diabetes Maternal  Grandfather    Hypertension Maternal Grandfather    Stroke Maternal Grandfather    Diabetes Paternal Grandmother    Hypertension Paternal Grandmother    Stroke Paternal Grandmother    Diabetes Paternal Grandfather    Hypertension Paternal Grandfather    Stroke Paternal Grandfather     Social History   Tobacco Use   Smoking status: Never   Smokeless tobacco: Never  Substance Use Topics   Alcohol use: Not Currently   Drug use: Never    Home Medications Prior to Admission medications   Medication Sig Start Date End Date Taking? Authorizing Provider  ALPRAZolam Prudy Feeler) 0.5 MG tablet Take 0.5 mg by mouth 3 (three) times daily as needed. 05/21/19   [provider]  buPROPion (WELLBUTRIN XL) 150 MG 24 hr tablet Take 3 tablets (450 mg total) by mouth daily. 07/25/20   Christen Butter, NP  cyanocobalamin (,VITAMIN B-12,) 1000 MCG/ML injection Inject 1,000 mg into the muscle every 30 (thirty) days. 05/26/19   [provider]  DULoxetine (CYMBALTA) 30 MG capsule Take 1 capsule (30 mg total) by mouth daily. 07/01/20   Christen Butter, NP  etonogestrel (NEXPLANON) 68 MG IMPL implant Inject into the skin.    [provider]  levothyroxine (SYNTHROID) 75 MCG tablet Take 75 mcg by mouth daily. 07/21/19   [provider]  LINZESS 290 MCG CAPS capsule Take 1 capsule by mouth. 07/14/19   [provider]  metoprolol succinate (TOPROL-XL) 25 MG 24 hr tablet Take 1 tablet by mouth as needed.    [provider]  Multiple Vitamins-Minerals (WOMENS MULTI VITAMIN & MINERAL) TABS Take 1 tablet by mouth daily.    [provider]  NURTEC 75 MG TBDP Take 1 tablet by mouth daily as needed. 07/28/19   [provider]  ondansetron (ZOFRAN-ODT) 8 MG disintegrating tablet Take 1 tablet (8 mg total) by mouth every 8 (eight) hours as needed for nausea. 03/30/20   Christen Butter, NP  promethazine (PHENERGAN) 25 MG tablet Take 25 mg by mouth every 6 (six) hours as  needed.  06/26/19   [provider]  topiramate (TOPAMAX) 200 MG tablet Take 200 mg by mouth 2 (two) times daily. 07/21/19   [provider]    Allergies    Penicillins  Review of Systems   Review of Systems  All other systems reviewed and are negative.  Physical Exam Updated Vital Signs BP 123/88 (BP Location: Right Arm)   Pulse 91   Temp 99 F (37.2 C) (Oral)   Resp 16   SpO2 100%   Physical Exam Vitals and nursing note reviewed.  Constitutional:      General: She is not in acute distress.    Appearance: She is well-developed.  HENT:     Head: Normocephalic.     Mouth/Throat:     Mouth: Mucous membranes are moist.  Eyes:     Extraocular Movements: Extraocular movements intact.  Cardiovascular:     Rate and Rhythm: Normal rate and regular rhythm.  Pulmonary:     Effort: Pulmonary effort is normal.     Breath sounds: Normal breath sounds.  Abdominal:     General: Abdomen is flat. Bowel sounds are normal.     Palpations: Abdomen is soft.     Tenderness: There is generalized abdominal tenderness.     Comments: Mild generalized tenderness  Genitourinary:    Vagina: Vaginal discharge present.     Cervix: Normal.     Uterus: Normal.      Adnexa:        Right: Tenderness present.        Left: Tenderness present.   Skin:    General: Skin is warm and dry.     Capillary Refill: Capillary refill takes less than 2 seconds.  Neurological:     General: No focal deficit present.     Mental Status: She is alert.  Psychiatric:        Mood and Affect: Mood normal.        Behavior: Behavior normal.    ED Results / Procedures / Treatments   Labs (all labs ordered are listed, but only abnormal results are displayed) Labs Reviewed  COMPREHENSIVE METABOLIC PANEL - Abnormal; Notable for the following components:      Result Value   Potassium 3.4 (*)    All other components within normal limits  CBC  HCG, SERUM, QUALITATIVE  URINALYSIS, ROUTINE W REFLEX  MICROSCOPIC    EKG None  Radiology No results found.  Procedures Procedures   Medications Ordered in ED Medications  oxyCODONE-acetaminophen (PERCOCET/ROXICET) 5-325 MG per tablet 2 tablet (has no administration in time range)  ondansetron (ZOFRAN-ODT) disintegrating tablet 4 mg (4 mg Oral Given 08/19/20 1304)    ED Course  I have reviewed the triage vital signs and the nursing  notes.  Pertinent labs & imaging results that were available during my care of the patient were reviewed by me and considered in my medical decision making (see chart for details).    MDM Rules/Calculators/A&P                           Discussed results of labs and reviewed prior imaging of cta and mri.  Patient states that her bladder feels fuller before she voids.  Denies frequency of dysuria.   Plan add ibuprofen Discussed with patient and Dr. Charm Barges.  He will review CT abd/pelvis and anticipate d/c  Final Clinical Impression(s) / ED Diagnoses Final diagnoses:  Left flank pain    Rx / DC Orders ED Discharge Orders     None        Margarita Grizzle, MD 08/19/20 1641

## 2020-08-19 NOTE — ED Provider Notes (Signed)
Signout from Dr. Rosalia Hammers.  35 year old female complaining of left flank pain starting today.  There is been possibly some blood in her urine.  Urinalysis here does not show any signs of bleeding.  Lab work otherwise unremarkable.  She is pending a CT renal.  Likely can be discharged if no significant findings. Physical Exam  BP 121/77   Pulse 84   Temp 99 F (37.2 C) (Oral)   Resp 15   SpO2 100%   Physical Exam  ED Course/Procedures     Procedures  MDM  CT does not show any acute findings.  Reviewed with patient.  She is comfortable plan for outpatient follow-up with her primary care doctor.  Return instructions discussed       Terrilee Files, MD 08/20/20 1006

## 2020-08-22 LAB — GC/CHLAMYDIA PROBE AMP (~~LOC~~) NOT AT ARMC
Chlamydia: NEGATIVE
Comment: NEGATIVE
Comment: NORMAL
Neisseria Gonorrhea: NEGATIVE

## 2020-08-23 ENCOUNTER — Other Ambulatory Visit: Payer: Self-pay

## 2020-08-23 ENCOUNTER — Ambulatory Visit
Admission: RE | Admit: 2020-08-23 | Discharge: 2020-08-23 | Disposition: A | Discharge status: Home / Self Care | Payer: BC Managed Care – PPO | Source: Ambulatory Visit | Attending: Vascular Surgery | Admitting: Vascular Surgery

## 2020-08-23 DIAGNOSIS — N9489 Other specified conditions associated with female genital organs and menstrual cycle: Secondary | ICD-10-CM

## 2020-08-23 HISTORY — PX: IR RADIOLOGIST EVAL & MGMT: IMG5224

## 2020-08-23 NOTE — Consult Note (Signed)
Chief Complaint: Patient was seen in consultation today for pelvic congestion syndrome at the request of Fields,Charles E  Referring Physician(s): Fields,Charles E  History of Present Illness: Yesenia Harper is a 35 y.o. female who presents at the kind request of Dr. [name] to assess for possible pelvic congestion syndrome.  She has been experiencing a constellation os symptoms including left calf pain, left foot/ankle swelling, left back/flank pain and left lower abdominal pain, heaviness, aching and bloating.  Her symptoms have been ongoing and progressive over the past several months.  She has been evaluated by neurology for low back pain/radiculopathy and this has been essentially ruled out.  She was evaluated for left lower extremity venous insufficiency which was positive but fairly mld.  She wears lower thigh high compression hose without much relief of her symptoms.   More recently, she underwent MRA and CTA imaging to evaluate for popliteal entrapment.  Her arteries are normal and without evidence of PAD.  However, on her MRA, which was conducted with directional time of flight, non contrast technique, there was definitive flow reversal and engorgement of the left ovarian vein and parametrial veins which raised the possibility of pelvic congestion.    She reports that she feels best upon waking up in the morning, or any time after laying down.  However, within a few hours of getting up and sitting/standing, her symptoms begin and worsen throughout the day.  She tells me that last Friday her left back/flank pain was almost unbearable by the end of the day.  She also struggles to walk long distances and feels like she is otherwise too young and healthy to be feeling this way.      Past Medical History:  Diagnosis Date   Anxiety    Anxiety with depression 08/03/2019   Delivery of pregnancy by cesarean section    Depression    Hypothyroidism     Past Surgical History:  Procedure  Laterality Date   CESAREAN SECTION     IR RADIOLOGIST EVAL & MGMT  08/23/2020    Allergies: Penicillins  Medications: Prior to Admission medications   Medication Sig Start Date End Date Taking? Authorizing Provider  ALPRAZolam Prudy Feeler) 0.5 MG tablet Take 0.5 mg by mouth 3 (three) times daily as needed. 05/21/19   [provider]  buPROPion (WELLBUTRIN XL) 150 MG 24 hr tablet Take 3 tablets (450 mg total) by mouth daily. 07/25/20   Christen Butter, NP  cyanocobalamin (,VITAMIN B-12,) 1000 MCG/ML injection Inject 1,000 mg into the muscle every 30 (thirty) days. 05/26/19   [provider]  DULoxetine (CYMBALTA) 30 MG capsule Take 1 capsule (30 mg total) by mouth daily. 07/01/20   Christen Butter, NP  etonogestrel (NEXPLANON) 68 MG IMPL implant Inject into the skin.    [provider]  levothyroxine (SYNTHROID) 75 MCG tablet Take 75 mcg by mouth daily. 07/21/19   [provider]  LINZESS 290 MCG CAPS capsule Take 1 capsule by mouth. 07/14/19   [provider]  Multiple Vitamins-Minerals (WOMENS MULTI VITAMIN & MINERAL) TABS Take 1 tablet by mouth daily.    [provider]  NURTEC 75 MG TBDP Take 1 tablet by mouth daily as needed. 07/28/19   [provider]  ondansetron (ZOFRAN-ODT) 8 MG disintegrating tablet Take 1 tablet (8 mg total) by mouth every 8 (eight) hours as needed for nausea. 03/30/20   Christen Butter, NP  topiramate (TOPAMAX) 200 MG tablet Take 200 mg by mouth 2 (two) times  daily. 07/21/19   [provider]     Family History  Problem Relation Age of Onset   Hypertension Mother    Diabetes Mother    Hypertension Father    Diabetes Father    Diabetes Maternal Grandmother    Hypertension Maternal Grandmother    Stroke Maternal Grandmother    Diabetes Maternal Grandfather    Hypertension Maternal Grandfather    Stroke Maternal Grandfather    Diabetes Paternal Grandmother    Hypertension Paternal Grandmother    Stroke Paternal  Grandmother    Diabetes Paternal Grandfather    Hypertension Paternal Grandfather    Stroke Paternal Grandfather     Social History   Socioeconomic History   Marital status: Married    Spouse name: Not on file   Number of children: Not on file   Years of education: Not on file   Highest education level: Not on file  Occupational History   Not on file  Tobacco Use   Smoking status: Never   Smokeless tobacco: Never  Substance and Sexual Activity   Alcohol use: Not Currently   Drug use: Never   Sexual activity: Yes    Partners: Male    Birth control/protection: I.U.D.  Other Topics Concern   Not on file  Social History Narrative   Not on file   Social Determinants of Health   Financial Resource Strain: Not on file  Food Insecurity: Not on file  Transportation Needs: Not on file  Physical Activity: Not on file  Stress: Not on file  Social Connections: Not on file   Review of Systems: A 12 point ROS discussed and pertinent positives are indicated in the HPI above.  All other systems are negative.  Review of Systems  Vital Signs: There were no vitals taken for this visit.  Physical Exam Constitutional:      General: She is not in acute distress.    Appearance: Normal appearance.  HENT:     Head: Normocephalic and atraumatic.  Eyes:     General: No scleral icterus. Cardiovascular:     Rate and Rhythm: Normal rate.  Pulmonary:     Effort: Pulmonary effort is normal.  Abdominal:     General: Abdomen is flat. There is no distension.     Palpations: Abdomen is soft.  Skin:    General: Skin is warm and dry.  Neurological:     Mental Status: She is alert and oriented to person, place, and time.  Psychiatric:        Mood and Affect: Mood normal.        Behavior: Behavior normal.      Imaging: CT ANGIO AO+BIFEM W & OR WO CONTRAST  Result Date: 08/16/2020 CLINICAL DATA:  35 year old female with possible popliteal entrapment syndrome. EXAM: CT ANGIOGRAPHY OF  ABDOMINAL AORTA WITH ILIOFEMORAL RUNOFF TECHNIQUE: Multidetector CT imaging of the abdomen, pelvis and lower extremities was performed using the standard protocol during bolus administration of intravenous contrast. The study was performed after the patient exercised to the point that lower extremity symptoms arose. Multiplanar CT image reconstructions and MIPs were obtained to evaluate the vascular anatomy. CONTRAST:  OMNIPAQUE IOHEXOL 350 MG/ML SOLN COMPARISON:  08/03/2020 FINDINGS: VASCULAR Aorta: Normal caliber aorta without aneurysm, dissection, vasculitis or significant stenosis. Celiac: Patent without evidence of aneurysm, dissection, vasculitis or significant stenosis. SMA: Patent without evidence of aneurysm, dissection, vasculitis or significant stenosis. Renals: Single bilateral renal arteries are patent without evidence of aneurysm, dissection, vasculitis, fibromuscular  dysplasia or significant stenosis. IMA: Patent without evidence of aneurysm, dissection, vasculitis or significant stenosis. RIGHT Lower Extremity Inflow: Common, internal and external iliac arteries are patent without evidence of aneurysm, dissection, vasculitis or significant stenosis. Outflow: Common, superficial and profunda femoral arteries and the popliteal artery are patent without evidence of aneurysm, dissection, vasculitis or significant stenosis. Runoff: Patent three vessel runoff to the ankle. LEFT Lower Extremity Inflow: Common, internal and external iliac arteries are patent without evidence of aneurysm, dissection, vasculitis or significant stenosis. Outflow: Common, superficial and profunda femoral arteries and the popliteal artery are patent without evidence of aneurysm, dissection, vasculitis or significant stenosis. Runoff: Patent three vessel runoff to the ankle. Veins: Again seen is a prominent left ovarian vein measuring up to 12 mm in maximum diameter. The right ovarian vein also appears enlarged measuring up  to 8 mm in maximum diameter. The periuterine veins appear engorged bilaterally. Limited evaluation of venous patency given arterial phase image acquisition Review of the MIP images confirms the above findings. NON-VASCULAR Lower chest: No acute abnormality. Hepatobiliary: No focal liver abnormality is seen. No gallstones, gallbladder wall thickening, or biliary dilatation. Pancreas: Unremarkable. No pancreatic ductal dilatation or surrounding inflammatory changes. Spleen: Normal in size without focal abnormality. Adrenals/Urinary Tract: Adrenal glands are unremarkable. Kidneys are normal, without renal calculi, focal lesion, or hydronephrosis. Bladder is unremarkable. Stomach/Bowel: Stomach is within normal limits. Appendix appears normal. No evidence of bowel wall thickening, distention, or inflammatory changes. Lymphatic: No abdominopelvic lymphadenopathy. Reproductive: Uterus and bilateral adnexa are unremarkable. Other: No abdominal wall hernia or abnormality. No abdominopelvic ascites. Musculoskeletal: No aberrant insertion of the medial head of the gastrocnemius muscle. Mild multilevel lower lumbar facet arthropathy, most pronounced on the right at L4-L5. No acute osseous abnormality. IMPRESSION: VASCULAR 1. Patent common normal caliber popliteal arteries bilaterally. Normal insertion of the medial head of the gastrocnemius muscle bilaterally. 2. Engorgement of the left ovarian vein and prominence of the periuterine veins as could be seen with pelvic venous disorder. NON-VASCULAR Mild lower lumbar spine degenerative change. RECOMMENDATIONS: As clinically indicated, consider bilateral lower extremity angiography with plantar flexion for further workup of popliteal entrapment syndrome. Interventional Radiology consultation for angiogram as well as evaluation and possible treatment of pelvic venous disorder is available on request. Lumbar spine MRI could be considered for evaluation of neurogenic etiology of  lower extremity symptoms given mild degenerative changes visualized on this study. Marliss Coots, MD [facility] Radiology Electronically Signed   By: Marliss Coots MD   On: 08/16/2020 16:32   MR ANGIO LOWER EXTREMITY BILATERAL WO CONTRAST  Result Date: 08/04/2020 CLINICAL DATA:  Left calf pain and swelling. Evaluate for popliteal artery entrapment syndrome. EXAM: MR MRA OF THE BILATERAL LOWER EXTREMITY WITHOUT CONTRAST TECHNIQUE: Angiographic images of the extremity were obtained using MRA technique without intravenous contrast. COMPARISON:  None. FINDINGS: Inflow: Widely patent aorta without evidence of aneurysm or dissection. The iliac arteries are similarly patent and unremarkable. Outflow: Widely patent common, profunda and superficial femoral arteries. The popliteal arteries are patent bilaterally. The courses are symmetric bilaterally. No abnormal deviation or external compression in the static state. Runoff: Patent 3 vessel runoff to the ankles. Other: Abnormal retrograde flow down and enlarged left ovarian vein with hypervascularity in the region of the left adnexa. IMPRESSION: 1. Normal appearance of the bilateral lower extremity arterial tree in the static/resting position. The popliteal arteries are widely patent and symmetric in appearance. Please note that this study does not include  dynamic imaging of the popliteal arteries in dorsi or plantar flexion and there is no soft tissue detail to assess the gastrocnemius musculature and its relationship with the popliteal arteries. 2. Enlarged and refluxing left ovarian vein. Similar anatomic features can be seen in the setting of pelvic congestion syndrome. Does the patient have symptoms such as pelvic pain or pressure, worse after long periods of standing; or dyspareunia? Signed, Sterling Big, MD, RPVI [facility] Radiology Electronically Signed   By:  Malachy Moan M.D.   On: 08/04/2020 08:25   CT Renal Stone Study  Result Date: 08/19/2020 CLINICAL DATA:  Flank pain on the left EXAM: CT ABDOMEN AND PELVIS WITHOUT CONTRAST TECHNIQUE: Multidetector CT imaging of the abdomen and pelvis was performed following the standard protocol without IV contrast. COMPARISON:  08/16/2020 FINDINGS: Lower chest: No acute abnormality. Hepatobiliary: No focal liver abnormality is seen. No gallstones, gallbladder wall thickening, or biliary dilatation. Pancreas: Unremarkable. No pancreatic ductal dilatation or surrounding inflammatory changes. Spleen: Normal in size without focal abnormality. Adrenals/Urinary Tract: Adrenal glands are within normal limits. Kidneys are well visualized bilaterally. No renal calculi or obstructive changes are seen. Bladder is partially distended. Stomach/Bowel: Scattered diverticular change of the colon is noted without evidence of diverticulitis. No obstructive changes are seen. The appendix is within normal limits. Small bowel and stomach are unremarkable. Vascular/Lymphatic: No significant vascular findings are present. No enlarged abdominal or pelvic lymph nodes. Reproductive: Uterus and bilateral adnexa are unremarkable. Other: No abdominal wall hernia or abnormality. No abdominopelvic ascites. Musculoskeletal: No acute or significant osseous findings. IMPRESSION: Diverticulosis without diverticulitis. No renal calculi or obstructive changes are seen. No acute abnormality to correspond with the patient's given clinical symptomatology is noted. Electronically Signed   By: Alcide Clever M.D.   On: 08/19/2020 17:24   IR Radiologist Eval & Mgmt  Result Date: 08/23/2020 Please refer to notes tab for details about interventional procedure. (Op Note)   Labs:  CBC: Recent Labs    08/31/19 1623 04/11/20 0859 07/01/20 0000 08/19/20 1245  WBC 6.3 5.6 6.3 5.9  HGB 12.1 12.4 12.8 12.4  HCT 35.9 37.3 38.1 38.0  PLT 300 310 302 278     COAGS: Recent Labs    04/11/20 0859  INR 1.0    BMP: Recent Labs    08/31/19 1623 04/11/20 0859 07/01/20 0000 08/16/20 1529 08/19/20 1245  NA 140 139 140  --  138  K 3.8 3.7 4.0  --  3.4*  CL 109* 106 108  --  109  CO2 22 20 27   --  23  GLUCOSE 84 84 76  --  80  BUN 12 15 13   --  10  CALCIUM 8.7 9.4 9.1  --  9.1  CREATININE 0.68 0.84 1.03 0.70 0.88  GFRNONAA 116  --  71  --  >60  GFRAA 134  --  83  --   --     LIVER FUNCTION TESTS: Recent Labs    08/31/19 1623 04/11/20 0859 07/01/20 0000 08/19/20 1245  BILITOT <0.2 0.3 0.3 0.5  AST 13 20 14 16   ALT 21 29 21 24   ALKPHOS 48 48  --  45  PROT 6.3 6.7 6.6 6.8  ALBUMIN 4.4 4.7  --  4.2    TUMOR MARKERS: No results for input(s): AFPTM, CEA, CA199, CHROMGRNA in the last 8760 hours.  Assessment and Plan:  Very pleasant 35 year-old female with a several months history of left  sided back pain, left abdominal/flank pain, heaviness and aching which is worse after sitting or standing for prolonged periods.  Additionally, she has intermittent left calf and foot pain and swelling also associated with upright positioning.  Her symptoms are becoming quite severe and impacting her ability to work and enjoy her life and family.   Her back/abdomen/flank symptoms are consistent with pelvic congestion syndrome, especially with confirmed imaging evidence of left ovarian vein reflux on recent time of flight non contrast MRA.  It's also possible that her left leg symptoms are related as the excess ovarian venous flow may be draining into the iliac system and resulting in congestion of the left leg venous outflow.   She is family complete and does not plan on additional pregnancies.  She is an excellent candidate for left ovarian vein embolization.  I believe this will alleviate her back/abdomen/flank symptoms and may improve her LLE issues as well.  If, the leg symptoms persist, then the underlying cause may be due to the superficial  venous insufficiency of her left anterior accessory GSV which has been previously documented by duplex US.    1.) Please schedule for left ovarian venogram and embolization to be performed by me at [facility] as soon as the schedule allows.    Thank you for this interesting consult.  I greatly enjoyed meeting Yesenia PiqueCynthia Harper and look forward to participating in their care.  A copy of this report was sent to the requesting provider on this date.  Electronically Signed: Sterling BigHeath K Luci Bellucci 08/23/2020, 3:08 PM   I spent a total of  40 Minutes  in face to face in clinical consultation, greater than 50% of which was counseling/coordinating care for pelvic congestion syndrome.

## 2020-08-24 ENCOUNTER — Other Ambulatory Visit: Payer: Self-pay | Admitting: Medical-Surgical

## 2020-08-30 ENCOUNTER — Encounter: Payer: Self-pay | Admitting: Medical-Surgical

## 2020-08-31 MED ORDER — ALPRAZOLAM 0.5 MG PO TABS: 0.5000 mg | ORAL_TABLET | Freq: Every day | ORAL | 0 refills | Status: DC | PRN

## 2020-08-31 NOTE — Telephone Encounter (Signed)
We have not prescribed this medication for the patient previously.  Please review and refill if appropriate.  T. Leila Schuff, CMA  

## 2020-09-04 ENCOUNTER — Encounter: Payer: Self-pay | Admitting: Medical-Surgical

## 2020-09-04 DIAGNOSIS — R635 Abnormal weight gain: Secondary | ICD-10-CM

## 2020-09-16 ENCOUNTER — Encounter: Payer: Self-pay | Admitting: Medical-Surgical

## 2020-09-16 DIAGNOSIS — R102 Pelvic and perineal pain: Secondary | ICD-10-CM | POA: Diagnosis not present

## 2020-09-16 DIAGNOSIS — R112 Nausea with vomiting, unspecified: Secondary | ICD-10-CM | POA: Diagnosis not present

## 2020-09-16 DIAGNOSIS — Z3202 Encounter for pregnancy test, result negative: Secondary | ICD-10-CM | POA: Diagnosis not present

## 2020-09-16 NOTE — Telephone Encounter (Signed)
Spoke with pt who states that she has had intermittent flank pain with radiation to the abdominal area.  Pt states that she is in a lot of pain and doesn't know what else to do about it.  She was recently seen at ED for this same issue and was told that she had an abnormal abdominal scan and that she is supposed to see IR, but they are fighting with the insurance company to get this approved.  Pt states that she has been taking the hydrocodone given by Christen Butter, NP previously and it is not providing any relief.  Advised pt that we do not have any available appts today and that, given her symptoms, she should be seen at ED as she will likely need STAT labs and further imaging.  Pt expressed understanding and is agreeable.  Routing message to PCP as FYI.  Tiajuana Amass, CMA

## 2020-09-20 DIAGNOSIS — R310 Gross hematuria: Secondary | ICD-10-CM | POA: Diagnosis not present

## 2020-09-22 ENCOUNTER — Ambulatory Visit: Payer: BC Managed Care – PPO | Admitting: Obstetrics and Gynecology

## 2020-09-28 ENCOUNTER — Other Ambulatory Visit: Payer: BC Managed Care – PPO

## 2020-10-03 ENCOUNTER — Encounter: Payer: Self-pay | Admitting: Medical-Surgical

## 2020-10-04 MED ORDER — LEVOTHYROXINE SODIUM 75 MCG PO TABS: 75.0000 ug | ORAL_TABLET | Freq: Every day | ORAL | 0 refills | Status: DC

## 2020-10-25 ENCOUNTER — Ambulatory Visit: Payer: BC Managed Care – PPO | Admitting: Obstetrics and Gynecology

## 2020-10-26 ENCOUNTER — Other Ambulatory Visit: Payer: Self-pay | Admitting: Vascular Surgery

## 2020-10-30 ENCOUNTER — Other Ambulatory Visit: Payer: Self-pay | Admitting: Medical-Surgical

## 2020-11-03 ENCOUNTER — Other Ambulatory Visit: Payer: Self-pay | Admitting: Interventional Radiology

## 2020-11-08 DIAGNOSIS — N9489 Other specified conditions associated with female genital organs and menstrual cycle: Secondary | ICD-10-CM

## 2020-11-08 HISTORY — DX: Other specified conditions associated with female genital organs and menstrual cycle: N94.89

## 2020-11-17 HISTORY — PX: IR US GUIDANCE: IMG2393

## 2020-11-18 ENCOUNTER — Encounter: Payer: Self-pay | Admitting: Medical-Surgical

## 2020-11-18 DIAGNOSIS — R41840 Attention and concentration deficit: Secondary | ICD-10-CM

## 2020-11-21 ENCOUNTER — Encounter: Payer: Self-pay | Admitting: Medical-Surgical

## 2020-11-22 ENCOUNTER — Other Ambulatory Visit: Payer: Self-pay | Admitting: Medical-Surgical

## 2020-11-22 DIAGNOSIS — M5136 Other intervertebral disc degeneration, lumbar region: Secondary | ICD-10-CM

## 2020-11-22 DIAGNOSIS — G935 Compression of brain: Secondary | ICD-10-CM

## 2020-11-22 DIAGNOSIS — M5416 Radiculopathy, lumbar region: Secondary | ICD-10-CM

## 2020-11-30 DIAGNOSIS — I871 Compression of vein: Secondary | ICD-10-CM | POA: Diagnosis not present

## 2020-11-30 DIAGNOSIS — R102 Pelvic and perineal pain: Secondary | ICD-10-CM | POA: Diagnosis not present

## 2020-11-30 HISTORY — PX: IR TRANSCATH PLC STENT  INITIAL VEIN  INC ANGIOPLASTY: IMG5445

## 2020-12-02 DIAGNOSIS — R1084 Generalized abdominal pain: Secondary | ICD-10-CM | POA: Diagnosis not present

## 2020-12-02 DIAGNOSIS — R110 Nausea: Secondary | ICD-10-CM | POA: Diagnosis not present

## 2020-12-05 ENCOUNTER — Telehealth: Payer: Medicaid Other | Admitting: Family

## 2020-12-05 DIAGNOSIS — R399 Unspecified symptoms and signs involving the genitourinary system: Secondary | ICD-10-CM | POA: Diagnosis not present

## 2020-12-05 MED ORDER — CEPHALEXIN 500 MG PO CAPS: 500.0000 mg | ORAL_CAPSULE | Freq: Two times a day (BID) | ORAL | 0 refills | Status: DC

## 2020-12-05 NOTE — Progress Notes (Signed)
Virtual Visit Consent   Yesenia Harper, you are scheduled for a virtual visit with a [facility] provider today.     Just as with appointments in the office, your consent must be obtained to participate.  Your consent will be active for this visit and any virtual visit you may have with one of our providers in the next 365 days.     If you have a MyChart account, a copy of this consent can be sent to you electronically.  All virtual visits are billed to your insurance company just like a traditional visit in the office.    As this is a virtual visit, video technology does not allow for your provider to perform a traditional examination.  This may limit your provider's ability to fully assess your condition.  If your provider identifies any concerns that need to be evaluated in person or the need to arrange testing (such as labs, EKG, etc.), we will make arrangements to do so.     Although advances in technology are sophisticated, we cannot ensure that it will always work on either your end or our end.  If the connection with a video visit is poor, the visit may have to be switched to a telephone visit.  With either a video or telephone visit, we are not always able to ensure that we have a secure connection.     I need to obtain your verbal consent now.   Are you willing to proceed with your visit today?    Yesenia Harper has provided verbal consent on 12/05/2020 for a virtual visit (video or telephone).   Jannifer Rodney, FNP   Date: 12/05/2020 6:17 PM   Virtual Visit via Video Note   I, Jannifer Rodney, connected with  Yesenia Harper  ([id number], [birth year]) on 12/05/20 at  6:15 PM EST by a video-enabled telemedicine application and verified that I am speaking with the correct person using two identifiers.  Location: Patient: Virtual Visit Location Patient: Home Provider: Virtual Visit Location Provider: Home Office   I discussed the limitations of evaluation and management by  telemedicine and the availability of in person appointments. The patient expressed understanding and agreed to proceed.    History of Present Illness: Yesenia Harper is a 35 y.o. who identifies as a female who was assigned female at birth, and is being seen today for urine odor. She had a stent placed in her left common iliac vein on 11/30/20. She reports three days ago she noticed a "metal" smell of her urine. Denies any dysuria, frequency. She has hematuria, but this is not unusual for her. She is out of of town for work.   HPI: HPI  Problems:  Patient Active Problem List   Diagnosis Date Noted   Depression    Delivery of pregnancy by cesarean section    Anxiety    Hypothyroidism 08/03/2019   Dry cough 08/03/2019   Anxiety with depression 08/03/2019    Allergies:  Allergies  Allergen Reactions   Penicillins Itching and Rash    Patient feels itching in mouth. Patient feels itching in mouth.   Medications:  Current Outpatient Medications:    cephALEXin (KEFLEX) 500 MG capsule, Take 1 capsule (500 mg total) by mouth 2 (two) times daily., Disp: 14 capsule, Rfl: 0   ALPRAZolam (XANAX) 0.5 MG tablet, Take 1 tablet (0.5 mg total) by mouth daily as needed. Please use sparingly and only for severe anxiety., Disp: 15 tablet, Rfl: 0   buPROPion (WELLBUTRIN XL)  150 MG 24 hr tablet, Take 3 tablets (450 mg total) by mouth daily., Disp: 270 tablet, Rfl: 1   cyanocobalamin (,VITAMIN B-12,) 1000 MCG/ML injection, Inject 1,000 mg into the muscle every 30 (thirty) days., Disp: , Rfl:    DULoxetine (CYMBALTA) 30 MG capsule, TAKE 1 CAPSULE BY MOUTH EVERY DAY, Disp: 30 capsule, Rfl: 1   etonogestrel (NEXPLANON) 68 MG IMPL implant, Inject into the skin., Disp: , Rfl:    levothyroxine (SYNTHROID) 75 MCG tablet, Take 1 tablet (75 mcg total) by mouth daily., Disp: 90 tablet, Rfl: 0   LINZESS 290 MCG CAPS capsule, Take 1 capsule by mouth., Disp: , Rfl:    Multiple Vitamins-Minerals (WOMENS MULTI VITAMIN &  MINERAL) TABS, Take 1 tablet by mouth daily., Disp: , Rfl:    NURTEC 75 MG TBDP, Take 1 tablet by mouth daily as needed., Disp: , Rfl:    ondansetron (ZOFRAN-ODT) 8 MG disintegrating tablet, Take 1 tablet (8 mg total) by mouth every 8 (eight) hours as needed for nausea., Disp: 20 tablet, Rfl: 0   topiramate (TOPAMAX) 200 MG tablet, Take 200 mg by mouth 2 (two) times daily., Disp: , Rfl:   Observations/Objective: Patient is well-developed, well-nourished in no acute distress.  Resting comfortably Head is normocephalic, atraumatic.  No labored breathing.  Speech is clear and coherent with logical content.  Patient is alert and oriented at baseline.    Assessment and Plan: 1. UTI symptoms - cephALEXin (KEFLEX) 500 MG capsule; Take 1 capsule (500 mg total) by mouth 2 (two) times daily.  Dispense: 14 capsule; Refill: 0 Discussed in length, if symptoms worsen or do not improve in next 24-48 hours she needs to be seen in person for urine testing.  Force fluids  Keep follow up with specialists  Follow Up Instructions: I discussed the assessment and treatment plan with the patient. The patient was provided an opportunity to ask questions and all were answered. The patient agreed with the plan and demonstrated an understanding of the instructions.  A copy of instructions were sent to the patient via MyChart unless otherwise noted below.     The patient was advised to call back or seek an in-person evaluation if the symptoms worsen or if the condition fails to improve as anticipated.  Time:  I spent 12 minutes with the patient via telehealth technology discussing the above problems/concerns.    Evelina Dun, FNP

## 2020-12-05 NOTE — Telephone Encounter (Signed)
Needs appointment

## 2020-12-08 DIAGNOSIS — R0602 Shortness of breath: Secondary | ICD-10-CM | POA: Diagnosis not present

## 2020-12-08 DIAGNOSIS — Z95828 Presence of other vascular implants and grafts: Secondary | ICD-10-CM | POA: Diagnosis not present

## 2020-12-08 DIAGNOSIS — R635 Abnormal weight gain: Secondary | ICD-10-CM | POA: Diagnosis not present

## 2020-12-08 DIAGNOSIS — R079 Chest pain, unspecified: Secondary | ICD-10-CM | POA: Diagnosis not present

## 2020-12-08 DIAGNOSIS — Z5321 Procedure and treatment not carried out due to patient leaving prior to being seen by health care provider: Secondary | ICD-10-CM | POA: Diagnosis not present

## 2020-12-09 ENCOUNTER — Encounter: Payer: Self-pay | Admitting: Medical-Surgical

## 2020-12-09 DIAGNOSIS — I82402 Acute embolism and thrombosis of unspecified deep veins of left lower extremity: Secondary | ICD-10-CM | POA: Diagnosis not present

## 2020-12-09 DIAGNOSIS — R079 Chest pain, unspecified: Secondary | ICD-10-CM | POA: Diagnosis not present

## 2020-12-09 DIAGNOSIS — Z5321 Procedure and treatment not carried out due to patient leaving prior to being seen by health care provider: Secondary | ICD-10-CM | POA: Diagnosis not present

## 2020-12-12 ENCOUNTER — Encounter: Payer: Self-pay | Admitting: Medical-Surgical

## 2020-12-13 ENCOUNTER — Other Ambulatory Visit: Payer: Self-pay

## 2020-12-13 ENCOUNTER — Ambulatory Visit (INDEPENDENT_AMBULATORY_CARE_PROVIDER_SITE_OTHER): Payer: Medicaid Other | Admitting: Family Medicine

## 2020-12-13 ENCOUNTER — Encounter: Payer: Self-pay | Admitting: Family Medicine

## 2020-12-13 VITALS — BP 129/80 | HR 108 | Temp 97.6°F | Resp 16 | Wt 143.0 lb

## 2020-12-13 DIAGNOSIS — M7989 Other specified soft tissue disorders: Secondary | ICD-10-CM | POA: Diagnosis not present

## 2020-12-13 DIAGNOSIS — E039 Hypothyroidism, unspecified: Secondary | ICD-10-CM | POA: Diagnosis not present

## 2020-12-13 DIAGNOSIS — R635 Abnormal weight gain: Secondary | ICD-10-CM

## 2020-12-13 DIAGNOSIS — I871 Compression of vein: Secondary | ICD-10-CM | POA: Insufficient documentation

## 2020-12-13 DIAGNOSIS — R350 Frequency of micturition: Secondary | ICD-10-CM

## 2020-12-13 HISTORY — DX: Compression of vein: I87.1

## 2020-12-13 LAB — POCT URINALYSIS DIP (CLINITEK)
Bilirubin, UA: NEGATIVE
Blood, UA: NEGATIVE
Glucose, UA: NEGATIVE mg/dL
Ketones, POC UA: NEGATIVE mg/dL
Leukocytes, UA: NEGATIVE
Nitrite, UA: NEGATIVE
POC PROTEIN,UA: 30 — AB
Spec Grav, UA: >=1.03 — AB (ref 1.010–1.025)
Urobilinogen, UA: 0.2 E.U./dL
pH, UA: 5.5 (ref 5.0–8.0)

## 2020-12-13 NOTE — Assessment & Plan Note (Signed)
Iliac stents placed about a month ago.  But unfortunately not having significant resolution of decreased blood flow, so recently has been referred to cardiology.

## 2020-12-13 NOTE — Assessment & Plan Note (Signed)
Recent weight gain.  Due to recheck TSH.

## 2020-12-13 NOTE — Progress Notes (Signed)
Acute Office Visit  Subjective:    Patient ID: Yesenia Harper, female    DOB: [birth year], 35 y.o.   MRN: [id number]  Chief Complaint  Patient presents with   Discuss [place]    Discuss [place] of left leg for possible blood clot    Urinary Frequency    Completed Keflex on 12/11/20 with no improvement of Frequency.     HPI Patient is in today for urinary frequency.  She did an E-visit on November 28 for urinary symptoms.  She was treated with Keflex which she finished 2 days ago on December 4.  But she still feels like she is having some frequency.  Is also requesting an ultrasound of her left leg.  Is a history of May Thurner with iliac stents placed.  And has a large bruise on the back of her left leg with a small knot.  She actually had a venous ultrasound of her left leg performed on December 1, 5 days ago showing partial compression the left common femoral vein without visualized thrombus but potentially representing a small nonocclusive DVT.  They have recommended repeat Doppler for further evaluation.  She says a lot of the swelling has improved she still has a bruise present though it also seems to be healing.  But again they did recommend a repeat Doppler.  She also wanted to let me know that she did have a pelvic embolization for pelvic congestion recently.  And also has been referred to cardiology because even after the stent was placed in the iliac she still does not have great blood flow through it and so she has an appointment in January with them and a heart scan in December.  Hypothyroidism - Taking medication regularly in the AM away from food and vitamins, etc. No recent change to skin, hair, or energy levels.  Is been on the same dose for little over 4 years.  She currently takes 75 mcg daily.  She is also very concerned about weight gain.  She feels like she is gaining weight very rapidly.  She has noticed that even her rings are fitting more tightly.  She thought initially a lot of  it was just the swelling in her legs but now that the swelling has gotten a lot better she has noticed that her weight is still elevated.  Past Medical History:  Diagnosis Date   Anxiety    Anxiety with depression 08/03/2019   Delivery of pregnancy by cesarean section    Depression    Hypothyroidism     Past Surgical History:  Procedure Laterality Date   CESAREAN SECTION     IR RADIOLOGIST EVAL & MGMT  08/23/2020    Family History  Problem Relation Age of Onset   Hypertension Mother    Diabetes Mother    Hypertension Father    Diabetes Father    Diabetes Maternal Grandmother    Hypertension Maternal Grandmother    Stroke Maternal Grandmother    Diabetes Maternal Grandfather    Hypertension Maternal Grandfather    Stroke Maternal Grandfather    Diabetes Paternal Grandmother    Hypertension Paternal Grandmother    Stroke Paternal Grandmother    Diabetes Paternal Grandfather    Hypertension Paternal Grandfather    Stroke Paternal Grandfather     Social History   Socioeconomic History   Marital status: Married    Spouse name: Not on file   Number of children: Not on file   Years of education: Not on  file   Highest education level: Not on file  Occupational History   Not on file  Tobacco Use   Smoking status: Never   Smokeless tobacco: Never  Substance and Sexual Activity   Alcohol use: Not Currently   Drug use: Never   Sexual activity: Yes    Partners: Male    Birth control/protection: I.U.D.  Other Topics Concern   Not on file  Social History Narrative   Not on file   Social Determinants of Health   Financial Resource Strain: Not on file  Food Insecurity: Not on file  Transportation Needs: Not on file  Physical Activity: Not on file  Stress: Not on file  Social Connections: Not on file  Intimate Partner Violence: Not on file    Outpatient Medications Prior to Visit  Medication Sig Dispense Refill   ALPRAZolam (XANAX) 0.5 MG tablet Take 1 tablet  (0.5 mg total) by mouth daily as needed. Please use sparingly and only for severe anxiety. 15 tablet 0   buPROPion (WELLBUTRIN XL) 150 MG 24 hr tablet Take 3 tablets (450 mg total) by mouth daily. 270 tablet 1   clopidogrel (PLAVIX) 75 MG tablet Take 75 mg by mouth daily.     cyanocobalamin (,VITAMIN B-12,) 1000 MCG/ML injection Inject 1,000 mg into the muscle every 30 (thirty) days.     DULoxetine (CYMBALTA) 30 MG capsule TAKE 1 CAPSULE BY MOUTH EVERY DAY 30 capsule 1   etonogestrel (NEXPLANON) 68 MG IMPL implant Inject into the skin.     levothyroxine (SYNTHROID) 75 MCG tablet Take 1 tablet (75 mcg total) by mouth daily. 90 tablet 0   linaclotide (LINZESS) 290 MCG CAPS capsule Take 290 mcg by mouth daily.     Multiple Vitamin (MULTIVITAMIN ADULT PO) Take 1 tablet by mouth daily.     Multiple Vitamins-Minerals (WOMENS MULTI VITAMIN & MINERAL) TABS Take 1 tablet by mouth daily.     NURTEC 75 MG TBDP Take 1 tablet by mouth daily as needed.     ondansetron (ZOFRAN-ODT) 8 MG disintegrating tablet Take 1 tablet (8 mg total) by mouth every 8 (eight) hours as needed for nausea. 20 tablet 0   Prenatal Vit-Fe Fumarate-FA (PRENATAL 1+1 PO) Take by mouth.     Rimegepant Sulfate (NURTEC) 75 MG TBDP Take 75 mg by mouth daily as needed.     topiramate (TOPAMAX) 200 MG tablet Take 200 mg by mouth 2 (two) times daily.     LINZESS 290 MCG CAPS capsule Take 1 capsule by mouth.     cephALEXin (KEFLEX) 500 MG capsule Take 1 capsule (500 mg total) by mouth 2 (two) times daily. 14 capsule 0   No facility-administered medications prior to visit.    Allergies  Allergen Reactions   Penicillins Itching and Rash    Patient feels itching in mouth. Patient feels itching in mouth.    Review of Systems     Objective:    Physical Exam Vitals reviewed.  Constitutional:      Appearance: She is well-developed.  HENT:     Head: Normocephalic and atraumatic.  Eyes:     Conjunctiva/sclera: Conjunctivae normal.   Cardiovascular:     Rate and Rhythm: Normal rate.  Pulmonary:     Effort: Pulmonary effort is normal.  Skin:    General: Skin is dry.     Coloration: Skin is not pale.  Neurological:     Mental Status: She is alert and oriented to person, place, and time.  Psychiatric:  Behavior: Behavior normal.    BP 129/80   Pulse (!) 108   Temp 97.6 F (36.4 C)   Resp 16   Wt 143 lb (64.9 kg)   SpO2 99%   BMI 27.02 kg/m  Wt Readings from Last 3 Encounters:  12/13/20 143 lb (64.9 kg)  08/04/20 131 lb (59.4 kg)  07/20/20 130 lb 12.8 oz (59.3 kg)    Health Maintenance Due  Topic Date Due   Hepatitis C Screening  Never done   PAP SMEAR-Modifier  Never done    There are no preventive care reminders to display for this patient.   Lab Results  Component Value Date   TSH 1.56 07/01/2020   Lab Results  Component Value Date   WBC 5.9 08/19/2020   HGB 12.4 08/19/2020   HCT 38.0 08/19/2020   MCV 94.8 08/19/2020   PLT 278 08/19/2020   Lab Results  Component Value Date   NA 138 08/19/2020   K 3.4 (L) 08/19/2020   CO2 23 08/19/2020   GLUCOSE 80 08/19/2020   BUN 10 08/19/2020   CREATININE 0.88 08/19/2020   BILITOT 0.5 08/19/2020   ALKPHOS 45 08/19/2020   AST 16 08/19/2020   ALT 24 08/19/2020   PROT 6.8 08/19/2020   ALBUMIN 4.2 08/19/2020   CALCIUM 9.1 08/19/2020   ANIONGAP 6 08/19/2020   EGFR 94 04/11/2020   No results found for: CHOL No results found for: HDL No results found for: LDLCALC No results found for: TRIG No results found for: CHOLHDL No results found for: HGBA1C     Assessment & Plan:   Problem List Items Addressed This Visit       Cardiovascular and Mediastinum   May-Thurner syndrome    Iliac stents placed about a month ago.  But unfortunately not having significant resolution of decreased blood flow, so recently has been referred to cardiology.        Endocrine   Hypothyroidism    Recent weight gain.  Due to recheck TSH.         Relevant Orders   TSH   Other Visit Diagnoses     Urinary frequency    -  Primary   Relevant Orders   Urine Culture   Microalbumin, urine   POCT URINALYSIS DIP (CLINITEK) (Completed)   Left leg swelling       Relevant Orders   US Venous Img Lower Unilateral Left   TSH   CBC with Differential/Platelet   COMPLETE METABOLIC PANEL WITH GFR   Weight gain       Relevant Orders   TSH   CBC with Differential/Platelet   COMPLETE METABOLIC PANEL WITH GFR   Abnormal weight gain          Urinary frequency-still having persistent symptoms.  Urinalysis only shows some protein but otherwise looks normal.  We will send for culture and urine microalbumin.  Left leg swelling seems to be better than it was overall.  But she still has some bruising on the posterior calf so we will plan to repeat the ultrasound just to make sure that there is no evidence of persistent DVT.  Abnormal weight gain-unclear etiology at this point again we will plan to recheck her thyroid.  Consider some of this could be extra fluid retention coming from other areas such as the liver so we will check her liver enzymes.  She does have a cardiac work-up coming up in a couple of weeks and will see cardiology in January.  No orders of the defined types were placed in this encounter.    Beatrice Lecher, MD

## 2020-12-13 NOTE — Telephone Encounter (Signed)
Patient has been scheduled with Dr Linford Arnold for this afternoon. AM

## 2020-12-14 ENCOUNTER — Encounter: Payer: Self-pay | Admitting: Family Medicine

## 2020-12-14 LAB — COMPLETE METABOLIC PANEL WITH GFR
AG Ratio: 1.6 (calc) (ref 1.0–2.5)
ALT: 49 U/L — ABNORMAL HIGH (ref 6–29)
AST: 30 U/L (ref 10–30)
Albumin: 4.1 g/dL (ref 3.6–5.1)
Alkaline phosphatase (APISO): 58 U/L (ref 31–125)
BUN: 17 mg/dL (ref 7–25)
CO2: 23 mmol/L (ref 20–32)
Calcium: 8.9 mg/dL (ref 8.6–10.2)
Chloride: 108 mmol/L (ref 98–110)
Creat: 0.81 mg/dL (ref 0.50–0.97)
Globulin: 2.6 g/dL (calc) (ref 1.9–3.7)
Glucose, Bld: 74 mg/dL (ref 65–99)
Potassium: 3.9 mmol/L (ref 3.5–5.3)
Sodium: 139 mmol/L (ref 135–146)
Total Bilirubin: 0.2 mg/dL (ref 0.2–1.2)
Total Protein: 6.7 g/dL (ref 6.1–8.1)
eGFR: 98 mL/min/{1.73_m2} (ref >=60)

## 2020-12-14 LAB — CBC WITH DIFFERENTIAL/PLATELET
Absolute Monocytes: 583 cells/uL (ref 200–950)
Basophils Absolute: 43 cells/uL (ref 0–200)
Basophils Relative: 0.6 %
Eosinophils Absolute: 151 cells/uL (ref 15–500)
Eosinophils Relative: 2.1 %
HCT: 36.7 % (ref 35.0–45.0)
Hemoglobin: 12.5 g/dL (ref 11.7–15.5)
Lymphs Abs: 2016 cells/uL (ref 850–3900)
MCH: 31.6 pg (ref 27.0–33.0)
MCHC: 34.1 g/dL (ref 32.0–36.0)
MCV: 92.7 fL (ref 80.0–100.0)
MPV: 9.2 fL (ref 7.5–12.5)
Monocytes Relative: 8.1 %
Neutro Abs: 4406 cells/uL (ref 1500–7800)
Neutrophils Relative %: 61.2 %
Platelets: 342 10*3/uL (ref 140–400)
RBC: 3.96 10*6/uL (ref 3.80–5.10)
RDW: 11.5 % (ref 11.0–15.0)
Total Lymphocyte: 28 %
WBC: 7.2 10*3/uL (ref 3.8–10.8)

## 2020-12-14 LAB — TSH: TSH: 3.52 mIU/L

## 2020-12-14 MED ORDER — HYDROCHLOROTHIAZIDE 12.5 MG PO CAPS: 12.5000 mg | ORAL_CAPSULE | Freq: Every day | ORAL | 1 refills | Status: DC

## 2020-12-14 NOTE — Progress Notes (Signed)
Hi Yesenia Harper, your blood count looks great.  Metabolic panel is normal except one of your liver enzymes is just slightly elevated this may be related to the issue that you were telling me about.  Is just a little borderline.  Your thyroid has shifted slightly normally you run between 1 and 2 and this time it is 3.5 so I do think we could make a slight change on your medication regimen for your thyroid and see if that is helping how you are feeling in particular regards to the weight gain.  So lets try having you take an extra half a tab 2 days a week.  So for example on Saturdays and Wednesdays you would take 1-1/2 tabs.  The other 5 days of a week you would just take 1 whole tab.  So that would be a total of an extra 75 mcg/week.  And then we can recheck your level in 6 to 8 weeks and see if that is working well.

## 2020-12-15 NOTE — Progress Notes (Signed)
Hi Shailyn,  ER urine culture is negative.  Just shows skin bacteria.  Do you feel like the frequency has gotten any better?  Or do you still felt like it is a a concern?

## 2020-12-16 ENCOUNTER — Ambulatory Visit (INDEPENDENT_AMBULATORY_CARE_PROVIDER_SITE_OTHER): Payer: Medicaid Other

## 2020-12-16 ENCOUNTER — Other Ambulatory Visit: Payer: Self-pay

## 2020-12-16 DIAGNOSIS — M79605 Pain in left leg: Secondary | ICD-10-CM

## 2020-12-16 DIAGNOSIS — M7989 Other specified soft tissue disorders: Secondary | ICD-10-CM

## 2020-12-16 NOTE — Progress Notes (Signed)
Hi Yesenia Harper, great news!  No sign of DVT which is fantastic.

## 2020-12-17 ENCOUNTER — Other Ambulatory Visit: Payer: Self-pay | Admitting: Medical-Surgical

## 2020-12-17 LAB — URINE CULTURE
MICRO NUMBER:: 12723643
SPECIMEN QUALITY:: ADEQUATE

## 2020-12-17 LAB — MICROALBUMIN, URINE: Microalb, Ur: 9.8 mg/dL

## 2020-12-19 NOTE — Progress Notes (Signed)
Hi Enas, urine culture came back negative which is great so hopefully you are just continuing to improve in regards to the urinary frequency.  You are such a lovely person and I really enjoyed meeting you.  Unfortunately I am not taking new patients at this time.  I increased my administrative tim,e as I have some other duties for the health system, and had to step back from taking new patients to allow access for my current patients.  I am always here to help though.  I am a support for [facility], as well as all of our providers here. Wishing you well.  Dr. Judie Petit

## 2020-12-23 ENCOUNTER — Encounter: Payer: Self-pay | Admitting: Family Medicine

## 2020-12-23 DIAGNOSIS — R233 Spontaneous ecchymoses: Secondary | ICD-10-CM

## 2020-12-23 DIAGNOSIS — R748 Abnormal levels of other serum enzymes: Secondary | ICD-10-CM

## 2020-12-26 NOTE — Telephone Encounter (Signed)
Labs orderd. Have her go anytime.   Orders Placed This Encounter  Procedures   INR/PT   Vitamin C   Sedimentation rate   C-reactive protein   Hepatic function panel   If all normal then can refer to hematology

## 2020-12-28 ENCOUNTER — Telehealth: Payer: Medicaid Other | Admitting: Medical-Surgical

## 2020-12-30 ENCOUNTER — Encounter: Payer: Self-pay | Admitting: Family Medicine

## 2021-01-04 ENCOUNTER — Encounter: Payer: Self-pay | Admitting: Family Medicine

## 2021-01-04 DIAGNOSIS — R748 Abnormal levels of other serum enzymes: Secondary | ICD-10-CM

## 2021-01-04 LAB — HEPATIC FUNCTION PANEL
AG Ratio: 1.8 (calc) (ref 1.0–2.5)
ALT: 56 U/L — ABNORMAL HIGH (ref 6–29)
AST: 32 U/L — ABNORMAL HIGH (ref 10–30)
Albumin: 4.4 g/dL (ref 3.6–5.1)
Alkaline phosphatase (APISO): 52 U/L (ref 31–125)
Bilirubin, Direct: 0.1 mg/dL (ref 0.0–0.2)
Globulin: 2.4 g/dL (calc) (ref 1.9–3.7)
Indirect Bilirubin: 0.2 mg/dL (calc) (ref 0.2–1.2)
Total Bilirubin: 0.3 mg/dL (ref 0.2–1.2)
Total Protein: 6.8 g/dL (ref 6.1–8.1)

## 2021-01-04 LAB — C-REACTIVE PROTEIN: CRP: 0.4 mg/L (ref <8.0)

## 2021-01-04 LAB — SEDIMENTATION RATE: Sed Rate: 2 mm/h (ref 0–20)

## 2021-01-04 LAB — VITAMIN C: Vitamin C: 1.3 mg/dL (ref 0.3–2.7)

## 2021-01-04 LAB — PROTIME-INR
INR: 1
Prothrombin Time: 10.3 s (ref 9.0–11.5)

## 2021-01-04 NOTE — Telephone Encounter (Signed)
GI referral pended, sign if appropriate.   Routing to [employer].

## 2021-01-04 NOTE — Progress Notes (Signed)
Hi Yesenia Harper, very for the delay in getting back to you you have been out of the office.  Your PT/INR is normal.  So that reflects how thick and thin your blood is and that looks good pretty good.  Your vitamin C level is also normal so wanted to just make sure that you are not deficient.  Your inflammatory markers are also normal so no indication of any autoimmune process.  Thus the only thing that that really leaves is your clopidogrel which is also known as Plavix.  It is meant to decrease your risk for blood clot but and so doing it does thin your blood and can contribute to more easy bruising.  I think that is the most likely culprit at this point since we have not found anything else underlying to explain it.  The only other concern, is your liver function in fact it has trended up slightly compared to 3 weeks ago.  I do not have a great reason for that so I really like to get you in with one of our gastroenterologists.  They specialize in the gut and liver and see what they think about it.  I would like to get you in with our physician at [facility] call Dr. Barron Alvine or one of his partners there.  If you are okay with that then please let me know and I will place a referral.

## 2021-01-05 ENCOUNTER — Other Ambulatory Visit: Payer: Self-pay | Admitting: Family Medicine

## 2021-01-05 ENCOUNTER — Encounter: Payer: Self-pay | Admitting: Family Medicine

## 2021-01-05 ENCOUNTER — Telehealth (INDEPENDENT_AMBULATORY_CARE_PROVIDER_SITE_OTHER): Payer: Medicaid Other | Admitting: Family Medicine

## 2021-01-05 DIAGNOSIS — R6889 Other general symptoms and signs: Secondary | ICD-10-CM

## 2021-01-05 LAB — POCT INFLUENZA A/B
Influenza A, POC: NEGATIVE
Influenza B, POC: NEGATIVE

## 2021-01-05 MED ORDER — ALBUTEROL SULFATE (2.5 MG/3ML) 0.083% IN NEBU: 2.5000 mg | INHALATION_SOLUTION | Freq: Four times a day (QID) | RESPIRATORY_TRACT | 1 refills | Status: DC | PRN

## 2021-01-05 MED ORDER — PREDNISONE 20 MG PO TABS: 20.0000 mg | ORAL_TABLET | Freq: Every day | ORAL | 0 refills | Status: AC

## 2021-01-05 MED ORDER — HYDROCOD POLST-CPM POLST ER 10-8 MG/5ML PO SUER: 5.0000 mL | Freq: Two times a day (BID) | ORAL | 0 refills | Status: AC | PRN

## 2021-01-05 NOTE — Progress Notes (Signed)
Virtual Video Visit via MyChart Note  I connected with  Yesenia Harper on 01/05/21 at  8:10 AM EST by the video enabled telemedicine application for MyChart, and verified that I am speaking with the correct person using two identifiers.   I introduced myself as a Publishing rights manager with the practice. We discussed the limitations of evaluation and management by telemedicine and the availability of in person appointments. The patient expressed understanding and agreed to proceed.  Participating parties in this visit include: The patient and the nurse practitioner listed.  The patient is: At home I am: In the office - [facility]  Subjective:    CC:  Chief Complaint  Patient presents with   URI    HPI: Yesenia Harper is a 35 y.o. year old female presenting today via MyChart today for URI symptoms.  Patient reports that she started feeling bad on Sunday (4 days ago). She is reporting headache and sinus pressure 8/10, nasal congestion, productive cough with thick/yellow sputum (reports he will occasionally cough up some pink tinged sputum after a bad coughing fit, no bright red blood or clots). She reports a fever up to 101.53F earlier this week, but no fever the past 2 days. She has still had some chills/sweats. States she is not getting much relief with OTC decongestants. She had a negative COVID test yesterday. She denies any chest pain, dyspnea, wheezing, nausea, vomiting, diarrhea. No known sick exposures. She is vaccinated against Flu.       Past medical history, Surgical history, Family history not pertinant except as noted below, Social history, Allergies, and medications have been entered into the medical record, reviewed, and corrections made.   Review of Systems:  All review of systems negative except what is listed in the HPI   Objective:    General:  Speaking clearly in complete sentences. Absent shortness of breath noted.   Alert and oriented x3.   Normal  judgment.  Absent acute distress.   Impression and Recommendations:    1. Flu-like symptoms - albuterol (PROVENTIL) (2.5 MG/3ML) 0.083% nebulizer solution; Take 3 mLs (2.5 mg total) by nebulization every 6 (six) hours as needed for wheezing or shortness of breath.  Dispense: 150 mL; Refill: 1 - chlorpheniramine-HYDROcodone (TUSSIONEX) 10-8 MG/5ML SUER; Take 5 mLs by mouth every 12 (twelve) hours as needed for up to 5 days for cough (cough, will cause drowsiness.).  Dispense: 50 mL; Refill: 0 - predniSONE (DELTASONE) 20 MG tablet; Take 1 tablet (20 mg total) by mouth daily with breakfast for 5 days.  Dispense: 5 tablet; Refill: 0 - POCT Influenza A/B  Patient to drive up for flu testing today. She is out of the window for Tamiflu. Educated on viral vs bacterial infections. Given the severity of her headaches/sinus pressure/coughing, sending in low dose prednisone burst, refilling albuterol nebs, and cough syrup. Continue supportive measures including rest, hydration, humidifier use, steam showers, warm compresses to sinuses, warm liquids with lemon and honey, and over-the-counter cough, cold, and analgesics as needed.  Patient aware of signs/symptoms requiring further/urgent evaluation. Let us know if not feeling any better after the weekend - in-person evaluation preferred at that time.    Follow-up if symptoms worsen or fail to improve.    I discussed the assessment and treatment plan with the patient. The patient was provided an opportunity to ask questions and all were answered. The patient agreed with the plan and demonstrated an understanding of the instructions.   The patient was advised to  call back or seek an in-person evaluation if the symptoms worsen or if the condition fails to improve as anticipated.  I spent 20 minutes dedicated to the care of this patient on the date of this encounter to include pre-visit chart review of prior notes and results, face-to-face time with the patient,  and post-visit ordering of testing as indicated.   Clayborne Dana, NP

## 2021-01-06 ENCOUNTER — Other Ambulatory Visit: Payer: Self-pay | Admitting: Family

## 2021-01-07 DIAGNOSIS — I4510 Unspecified right bundle-branch block: Secondary | ICD-10-CM | POA: Diagnosis not present

## 2021-01-09 ENCOUNTER — Telehealth: Payer: Medicaid Other | Admitting: Physician Assistant

## 2021-01-09 DIAGNOSIS — J208 Acute bronchitis due to other specified organisms: Secondary | ICD-10-CM | POA: Diagnosis not present

## 2021-01-09 DIAGNOSIS — B9689 Other specified bacterial agents as the cause of diseases classified elsewhere: Secondary | ICD-10-CM

## 2021-01-09 MED ORDER — AZITHROMYCIN 250 MG PO TABS: ORAL_TABLET | ORAL | 0 refills | Status: AC

## 2021-01-09 NOTE — Patient Instructions (Signed)
Bonney Leitz, thank you for joining Mar Daring, PA-C for today's virtual visit.  While this provider is not your primary care provider (PCP), if your PCP is located in our provider database this encounter information will be shared with them immediately following your visit.  Consent: (Patient) Yesenia Harper provided verbal consent for this virtual visit at the beginning of the encounter.  Current Medications:  Current Outpatient Medications:    azithromycin (ZITHROMAX) 250 MG tablet, Take 2 tablets on day 1, then 1 tablet daily on days 2 through 5, Disp: 6 tablet, Rfl: 0   albuterol (PROVENTIL) (2.5 MG/3ML) 0.083% nebulizer solution, Take 3 mLs (2.5 mg total) by nebulization every 6 (six) hours as needed for wheezing or shortness of breath., Disp: 150 mL, Rfl: 1   ALPRAZolam (XANAX) 0.5 MG tablet, Take 1 tablet (0.5 mg total) by mouth daily as needed. Please use sparingly and only for severe anxiety., Disp: 15 tablet, Rfl: 0   buPROPion (WELLBUTRIN XL) 150 MG 24 hr tablet, Take 3 tablets (450 mg total) by mouth daily., Disp: 270 tablet, Rfl: 1   chlorpheniramine-HYDROcodone (TUSSIONEX) 10-8 MG/5ML SUER, Take 5 mLs by mouth every 12 (twelve) hours as needed for up to 5 days for cough (cough, will cause drowsiness.)., Disp: 50 mL, Rfl: 0   cyanocobalamin (,VITAMIN B-12,) 1000 MCG/ML injection, Inject 1,000 mg into the muscle every 30 (thirty) days., Disp: , Rfl:    DULoxetine (CYMBALTA) 30 MG capsule, TAKE 1 CAPSULE BY MOUTH EVERY DAY, Disp: 30 capsule, Rfl: 1   etonogestrel (NEXPLANON) 68 MG IMPL implant, Inject into the skin., Disp: , Rfl:    hydrochlorothiazide (MICROZIDE) 12.5 MG capsule, Take 1 capsule (12.5 mg total) by mouth daily., Disp: 30 capsule, Rfl: 1   levothyroxine (SYNTHROID) 75 MCG tablet, Take 1 tablet (75 mcg total) by mouth daily., Disp: 90 tablet, Rfl: 0   linaclotide (LINZESS) 290 MCG CAPS capsule, Take 290 mcg by mouth daily., Disp: , Rfl:    Multiple Vitamin  (MULTIVITAMIN ADULT PO), Take 1 tablet by mouth daily., Disp: , Rfl:    NURTEC 75 MG TBDP, Take 1 tablet by mouth daily as needed., Disp: , Rfl:    ondansetron (ZOFRAN-ODT) 8 MG disintegrating tablet, Take 1 tablet (8 mg total) by mouth every 8 (eight) hours as needed for nausea., Disp: 20 tablet, Rfl: 0   predniSONE (DELTASONE) 20 MG tablet, Take 1 tablet (20 mg total) by mouth daily with breakfast for 5 days., Disp: 5 tablet, Rfl: 0   Prenatal Vit-Fe Fumarate-FA (PRENATAL 1+1 PO), Take by mouth., Disp: , Rfl:    topiramate (TOPAMAX) 200 MG tablet, Take 200 mg by mouth 2 (two) times daily., Disp: , Rfl:    Medications ordered in this encounter:  Meds ordered this encounter  Medications   azithromycin (ZITHROMAX) 250 MG tablet    Sig: Take 2 tablets on day 1, then 1 tablet daily on days 2 through 5    Dispense:  6 tablet    Refill:  0    Order Specific Question:   Supervising Provider    Answer:   Noemi Chapel [[id number]]     *If you need refills on other medications prior to your next appointment, please contact your pharmacy*  Follow-Up: Call back or seek an in-person evaluation if the symptoms worsen or if the condition fails to improve as anticipated.  Other Instructions Acute Bronchitis, Adult Acute bronchitis is sudden inflammation of the main airways (bronchi) that come off the windpipe (  trachea) in the lungs. The swelling causes the airways to get smaller and make more mucus than normal. This can make it hard to breathe and can cause coughing or noisy breathing (wheezing). Acute bronchitis may last several weeks. The cough may last longer. Allergies, asthma, and exposure to smoke may make the condition worse. What are the causes? This condition can be caused by germs and by substances that irritate the lungs, including: Cold and flu viruses. The most common cause of this condition is the virus that causes the common cold. Bacteria. This is less common. Breathing in substances that  irritate the lungs, including: Smoke from cigarettes and other forms of tobacco. Dust and pollen. Fumes from household cleaning products, gases, or burned fuel. Indoor or outdoor air pollution. What increases the risk? The following factors may make you more likely to develop this condition: A weak body's defense system, also called the immune system. A condition that affects your lungs and breathing, such as asthma. What are the signs or symptoms? Common symptoms of this condition include: Coughing. This may bring up clear, yellow, or green mucus from your lungs (sputum). Wheezing. Runny or stuffy nose. Having too much mucus in your lungs (chest congestion). Shortness of breath. Aches and pains, including sore throat or chest. How is this diagnosed? This condition is usually diagnosed based on: Your symptoms and medical history. A physical exam. You may also have other tests, including tests to rule out other conditions, such as pneumonia. These tests include: A test of lung function. Test of a mucus sample to look for the presence of bacteria. Tests to check the oxygen level in your blood. Blood tests. Chest X-ray. How is this treated? Most cases of acute bronchitis clear up over time without treatment. Your health care provider may recommend: Drinking more fluids to help thin your mucus so it is easier to cough up. Taking inhaled medicine (inhaler) to improve air flow in and out of your lungs. Using a vaporizer or a humidifier. These are machines that add water to the air to help you breathe better. Taking a medicine that thins mucus and clears congestion (expectorant). Taking a medicine that prevents or stops coughing (cough suppressant). It is notcommon to take an antibiotic medicine for this condition. Follow these instructions at home:  Take over-the-counter and prescription medicines only as told by your health care provider. Use an inhaler, vaporizer, or humidifier as  told by your health care provider. Take two teaspoons (10 mL) of honey at bedtime to lessen coughing at night. Drink enough fluid to keep your urine pale yellow. Do not use any products that contain nicotine or tobacco. These products include cigarettes, chewing tobacco, and vaping devices, such as e-cigarettes. If you need help quitting, ask your health care provider. Get plenty of rest. Return to your normal activities as told by your health care provider. Ask your health care provider what activities are safe for you. Keep all follow-up visits. This is important. How is this prevented? To lower your risk of getting this condition again: Wash your hands often with soap and water for at least 20 seconds. If soap and water are not available, use hand sanitizer. Avoid contact with people who have cold symptoms. Try not to touch your mouth, nose, or eyes with your hands. Avoid breathing in smoke or chemical fumes. Breathing smoke or chemical fumes will make your condition worse. Get the flu shot every year. Contact a health care provider if: Your symptoms do not  improve after 2 weeks. You have trouble coughing up the mucus. Your cough keeps you awake at night. You have a fever. Get help right away if you: Cough up blood. Feel pain in your chest. Have severe shortness of breath. Faint or keep feeling like you are going to faint. Have a severe headache. Have a fever or chills that get worse. These symptoms may represent a serious problem that is an emergency. Do not wait to see if the symptoms will go away. Get medical help right away. Call your local emergency services (911 in the [place] Do not drive yourself to the hospital. Summary Acute bronchitis is inflammation of the main airways (bronchi) that come off the windpipe (trachea) in the lungs. The swelling causes the airways to get smaller and make more mucus than normal. Drinking more fluids can help thin your mucus so it is easier to  cough up. Take over-the-counter and prescription medicines only as told by your health care provider. Do not use any products that contain nicotine or tobacco. These products include cigarettes, chewing tobacco, and vaping devices, such as e-cigarettes. If you need help quitting, ask your health care provider. Contact a health care provider if your symptoms do not improve after 2 weeks. This information is not intended to replace advice given to you by your health care provider. Make sure you discuss any questions you have with your health care provider. Document Revised: 04/27/2020 Document Reviewed: 04/27/2020 Elsevier Patient Education  2022 [employer].    If you have been instructed to have an in-person evaluation today at a local Urgent Care facility, please use the link below. It will take you to a list of all of our available [facility], including address, phone number and hours of operation. Please do not delay care.  [facility]  If you or a family member do not have a primary care provider, use the link below to schedule a visit and establish care. When you choose a  primary care physician or advanced practice provider, you gain a long-term partner in health. Find a Primary Care Provider  Learn more about 's in-office and virtual care options: [facility]

## 2021-01-09 NOTE — Progress Notes (Signed)
Virtual Visit Consent   Yesenia Harper, you are scheduled for a virtual visit with a [facility] provider today.     Just as with appointments in the office, your consent must be obtained to participate.  Your consent will be active for this visit and any virtual visit you may have with one of our providers in the next 365 days.     If you have a MyChart account, a copy of this consent can be sent to you electronically.  All virtual visits are billed to your insurance company just like a traditional visit in the office.    As this is a virtual visit, video technology does not allow for your provider to perform a traditional examination.  This may limit your provider's ability to fully assess your condition.  If your provider identifies any concerns that need to be evaluated in person or the need to arrange testing (such as labs, EKG, etc.), we will make arrangements to do so.     Although advances in technology are sophisticated, we cannot ensure that it will always work on either your end or our end.  If the connection with a video visit is poor, the visit may have to be switched to a telephone visit.  With either a video or telephone visit, we are not always able to ensure that we have a secure connection.     I need to obtain your verbal consent now.   Are you willing to proceed with your visit today?    Yesenia Harper has provided verbal consent on 01/09/2021 for a virtual visit (video or telephone).   Yesenia Loveless, PA-C   Date: 01/09/2021 6:04 PM   Virtual Visit via Video Note   I, Yesenia Harper, connected with  Yesenia Harper  ([id number], 01-01-1986) on 01/09/21 at  6:00 PM EST by a video-enabled telemedicine application and verified that I am speaking with the correct person using two identifiers.  Location: Patient: Virtual Visit Location Patient: Home Provider: Virtual Visit Location Provider: Home Office   I discussed the limitations of evaluation and management by  telemedicine and the availability of in person appointments. The patient expressed understanding and agreed to proceed.    History of Present Illness: Yesenia Harper is a 36 y.o. who identifies as a female who was assigned female at birth, and is being seen today for URI symptoms.  HPI: URI  This is a new problem. The current episode started 1 to 4 weeks ago (Sunday, 01/01/21). The problem has been gradually worsening. There has been no fever. Associated symptoms include congestion and coughing. Pertinent negatives include no headaches, rhinorrhea or sinus pain. Associated symptoms comments: Clammy feeling intermittently.  Was seen on 01/05/21 via telemedicine by her PCP office and advised viral infection (flu-like). Was started on prednisone, nebulizer, albuterol, Tussionex cough syrup  Went and has been flu tested that was negative. Rapid at home covid test negative  Problems:  Patient Active Problem List   Diagnosis Date Noted   May-Thurner syndrome 12/13/2020   History of DVT (deep vein thrombosis) 07/05/2020   Scoliosis 12/18/2019   Depression    Delivery of pregnancy by cesarean section    Anxiety    Hypothyroidism 08/03/2019   Dry cough 08/03/2019   Anxiety with depression 08/03/2019    Allergies:  Allergies  Allergen Reactions   Penicillins Itching and Rash    Patient feels itching in mouth. Patient feels itching in mouth.   Medications:  Current Outpatient Medications:  azithromycin (ZITHROMAX) 250 MG tablet, Take 2 tablets on day 1, then 1 tablet daily on days 2 through 5, Disp: 6 tablet, Rfl: 0   albuterol (PROVENTIL) (2.5 MG/3ML) 0.083% nebulizer solution, Take 3 mLs (2.5 mg total) by nebulization every 6 (six) hours as needed for wheezing or shortness of breath., Disp: 150 mL, Rfl: 1   ALPRAZolam (XANAX) 0.5 MG tablet, Take 1 tablet (0.5 mg total) by mouth daily as needed. Please use sparingly and only for severe anxiety., Disp: 15 tablet, Rfl: 0   buPROPion  (WELLBUTRIN XL) 150 MG 24 hr tablet, Take 3 tablets (450 mg total) by mouth daily., Disp: 270 tablet, Rfl: 1   chlorpheniramine-HYDROcodone (TUSSIONEX) 10-8 MG/5ML SUER, Take 5 mLs by mouth every 12 (twelve) hours as needed for up to 5 days for cough (cough, will cause drowsiness.)., Disp: 50 mL, Rfl: 0   cyanocobalamin (,VITAMIN B-12,) 1000 MCG/ML injection, Inject 1,000 mg into the muscle every 30 (thirty) days., Disp: , Rfl:    DULoxetine (CYMBALTA) 30 MG capsule, TAKE 1 CAPSULE BY MOUTH EVERY DAY, Disp: 30 capsule, Rfl: 1   etonogestrel (NEXPLANON) 68 MG IMPL implant, Inject into the skin., Disp: , Rfl:    hydrochlorothiazide (MICROZIDE) 12.5 MG capsule, Take 1 capsule (12.5 mg total) by mouth daily., Disp: 30 capsule, Rfl: 1   levothyroxine (SYNTHROID) 75 MCG tablet, Take 1 tablet (75 mcg total) by mouth daily., Disp: 90 tablet, Rfl: 0   linaclotide (LINZESS) 290 MCG CAPS capsule, Take 290 mcg by mouth daily., Disp: , Rfl:    Multiple Vitamin (MULTIVITAMIN ADULT PO), Take 1 tablet by mouth daily., Disp: , Rfl:    NURTEC 75 MG TBDP, Take 1 tablet by mouth daily as needed., Disp: , Rfl:    ondansetron (ZOFRAN-ODT) 8 MG disintegrating tablet, Take 1 tablet (8 mg total) by mouth every 8 (eight) hours as needed for nausea., Disp: 20 tablet, Rfl: 0   predniSONE (DELTASONE) 20 MG tablet, Take 1 tablet (20 mg total) by mouth daily with breakfast for 5 days., Disp: 5 tablet, Rfl: 0   Prenatal Vit-Fe Fumarate-FA (PRENATAL 1+1 PO), Take by mouth., Disp: , Rfl:    topiramate (TOPAMAX) 200 MG tablet, Take 200 mg by mouth 2 (two) times daily., Disp: , Rfl:   Observations/Objective: Patient is well-developed, well-nourished in no acute distress.  Resting comfortably  at home.  Head is normocephalic, atraumatic.  No labored breathing.  Speech is clear and coherent with logical content.  Patient is alert and oriented at baseline.  Deep, bronchial cough heard x 1  Assessment and Plan: 1. Acute bacterial  bronchitis - azithromycin (ZITHROMAX) 250 MG tablet; Take 2 tablets on day 1, then 1 tablet daily on days 2 through 5  Dispense: 6 tablet; Refill: 0  - Worsening symptoms - Continue other prescribed medications - Z-pack added - Push fluids - Rest - Seek in person evaluation if symptoms persist or worsen  Follow Up Instructions: I discussed the assessment and treatment plan with the patient. The patient was provided an opportunity to ask questions and all were answered. The patient agreed with the plan and demonstrated an understanding of the instructions.  A copy of instructions were sent to the patient via MyChart unless otherwise noted below.    The patient was advised to call back or seek an in-person evaluation if the symptoms worsen or if the condition fails to improve as anticipated.  Time:  I spent 10 minutes with the patient via telehealth technology  discussing the above problems/concerns.    Mar Daring, PA-C

## 2021-01-10 ENCOUNTER — Encounter: Payer: Self-pay | Admitting: Medical-Surgical

## 2021-01-10 ENCOUNTER — Encounter: Payer: Self-pay | Admitting: Physician Assistant

## 2021-01-10 MED ORDER — LEVOTHYROXINE SODIUM 75 MCG PO TABS: ORAL_TABLET | ORAL | 1 refills | Status: DC

## 2021-01-21 ENCOUNTER — Encounter: Payer: Self-pay | Admitting: Family Medicine

## 2021-01-24 ENCOUNTER — Telehealth: Payer: Self-pay | Admitting: *Deleted

## 2021-01-24 NOTE — Telephone Encounter (Signed)
Returned call from 3:53 PM. No answer/voicemail to leave a message to schedule a New patient appointment per spouse that called from [phone]. Patient will need a current date DPR on file to be able schedule with spouse. Spouse was called back to inform him that the office will need to schedule with wife, but no answer/voicemail to leave a message.

## 2021-01-25 ENCOUNTER — Other Ambulatory Visit: Payer: Self-pay | Admitting: Medical-Surgical

## 2021-01-26 ENCOUNTER — Telehealth: Payer: Self-pay | Admitting: *Deleted

## 2021-01-26 NOTE — Telephone Encounter (Signed)
Returned call from 1:57 PM. No answer/voicemail to leave a message to schedule a new patient appointment per spouse that called from [phone]. Patient will need a current date DPR on file to be able schedule with spouse. Spouse was called back to inform him that the office will need to schedule with wife, but no answer/voicemail to leave a message.

## 2021-01-28 ENCOUNTER — Other Ambulatory Visit: Payer: Self-pay | Admitting: Medical-Surgical

## 2021-01-30 ENCOUNTER — Encounter: Payer: Self-pay | Admitting: Medical-Surgical

## 2021-01-30 DIAGNOSIS — R748 Abnormal levels of other serum enzymes: Secondary | ICD-10-CM | POA: Diagnosis not present

## 2021-01-31 ENCOUNTER — Other Ambulatory Visit: Payer: Self-pay | Admitting: Family Medicine

## 2021-01-31 ENCOUNTER — Other Ambulatory Visit: Payer: Self-pay | Admitting: Medical-Surgical

## 2021-02-01 ENCOUNTER — Ambulatory Visit: Payer: Medicaid Other | Admitting: Medical-Surgical

## 2021-02-01 NOTE — Telephone Encounter (Signed)
Joy patient. 

## 2021-02-02 ENCOUNTER — Ambulatory Visit: Payer: Medicaid Other | Admitting: Medical-Surgical

## 2021-02-08 ENCOUNTER — Other Ambulatory Visit (INDEPENDENT_AMBULATORY_CARE_PROVIDER_SITE_OTHER): Payer: Medicaid Other

## 2021-02-08 ENCOUNTER — Other Ambulatory Visit: Payer: Self-pay

## 2021-02-08 ENCOUNTER — Encounter: Payer: Self-pay | Admitting: Gastroenterology

## 2021-02-08 ENCOUNTER — Ambulatory Visit (INDEPENDENT_AMBULATORY_CARE_PROVIDER_SITE_OTHER): Payer: Medicaid Other | Admitting: Gastroenterology

## 2021-02-08 VITALS — BP 120/78 | HR 90 | Ht 61.0 in | Wt 145.0 lb

## 2021-02-08 DIAGNOSIS — R748 Abnormal levels of other serum enzymes: Secondary | ICD-10-CM | POA: Diagnosis not present

## 2021-02-08 DIAGNOSIS — R1011 Right upper quadrant pain: Secondary | ICD-10-CM

## 2021-02-08 DIAGNOSIS — R110 Nausea: Secondary | ICD-10-CM

## 2021-02-08 DIAGNOSIS — L299 Pruritus, unspecified: Secondary | ICD-10-CM

## 2021-02-08 DIAGNOSIS — R930 Abnormal findings on diagnostic imaging of skull and head, not elsewhere classified: Secondary | ICD-10-CM | POA: Diagnosis not present

## 2021-02-08 DIAGNOSIS — K5909 Other constipation: Secondary | ICD-10-CM

## 2021-02-08 NOTE — Patient Instructions (Addendum)
If you are age 35 or younger, your body mass index should be between 19-25. Your Body mass index is 27.4 kg/m. If this is out of the aformentioned range listed, please consider follow up with your Primary Care Provider.   __________________________________________________________  The [facility] providers would like to encourage you to use [facility] to communicate with providers for non-urgent requests or questions.  Due to long hold times on the telephone, sending your provider a message by [facility] may be a faster and more efficient way to get a response.  Please allow 48 business hours for a response.  Please remember that this is for non-urgent requests.    Due to recent changes in healthcare laws, you may see the results of your imaging and laboratory studies on MyChart before your provider has had a chance to review them.  We understand that in some cases there may be results that are confusing or concerning to you. Not all laboratory results come back in the same time frame and the provider may be waiting for multiple results in order to interpret others.  Please give [place] 48 hours in order for your provider to thoroughly review all the results before contacting the office for clarification of your results.    Please go to the lab on the 2nd floor [address] before you leave the office today.    We are sending your referral to [facility]. Someone will be contacting you from that office to schedule your appointment. If you do not hear back from them within 2 weeks call us as [phone].  You will be contacted by [facility] Sched[facility] in the next 2 days to arrange an appointment for HIDA scan.  The number on your caller ID will be [phone], please answer when they call.  If you have not heard from them in 2 days please call [phone] to schedule.    You have been scheduled for a HIDA scan at [facility] (1st floor) on _______. Please arrive 15 minutes prior to your  scheduled appointment at ______. Make certain not to have anything to eat or drink at least 6 hours prior to your test. Should this appointment date or time not work well for you, please call radiology scheduling at [phone].  _____________________________________________________________________ hepatobiliary (HIDA) scan is an imaging procedure used to diagnose problems in the liver, gallbladder and bile ducts. In the HIDA scan, a radioactive chemical or tracer is injected into a vein in your arm. The tracer is handled by the liver like bile. Bile is a fluid produced and excreted by your liver that helps your digestive system break down fats in the foods you eat. Bile is stored in your gallbladder and the gallbladder releases the bile when you eat a meal. A special nuclear medicine scanner (gamma camera) tracks the flow of the tracer from your liver into your gallbladder and small intestine.  During your HIDA scan  You'll be asked to change into a hospital gown before your HIDA scan begins. Your health care team will position you on a table, usually on your back. The radioactive tracer is then injected into a vein in your arm.The tracer travels through your bloodstream to your liver, where it's taken up by the bile-producing cells. The radioactive tracer travels with the bile from your liver into your gallbladder and through your bile ducts to your small intestine.You may feel some pressure while the radioactive tracer is injected into your vein. As you lie on the  table, a special gamma camera is positioned over your abdomen taking pictures of the tracer as it moves through your body. The gamma camera takes pictures continually for about an hour. You'll need to keep still during the HIDA scan. This can become uncomfortable, but you may find that you can lessen the discomfort by taking deep breaths and thinking about other things. Tell your health care team if you're uncomfortable. The radiologist will watch  on a computer the progress of the radioactive tracer through your body. The HIDA scan may be stopped when the radioactive tracer is seen in the gallbladder and enters your small intestine. This typically takes about an hour. In some cases extra imaging will be performed if original images aren't satisfactory, if morphine is given to help visualize the gallbladder or if the medication CCK is given to look at the contraction of the gallbladder. This test typically takes 2 hours to complete.  Thank you for choosing me and [facility].  Doristine Locks, D.O.  ________________________________________________________________________

## 2021-02-08 NOTE — Progress Notes (Signed)
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  Labs in 12/2020 x2 notable for the following: - 12/13/2020:  AST/ALT 30/49, otherwise normal CMP - 12/29/2020: AST/ALT 32/56 with otherwise CMP - Normal ESR, CRP, CBC, TSH - Completely normal CMP on 12/09/2020 - Reviewed the many CMP's over the last year or so, and completely normal liver enzymes and each 1. - 01/2021: Abdominal ultrasound: Normal liver, GB, CBD   Previously followed with Dr. Percell Miller at [place], last seen 12/2019 for evaluation of nausea, constipation, and generalized abdominal pain.  Was taking Linzess 290 mcg/day, added MiraLAX, and started Dexilant 60 mg/day, along with dicyclomine tid scheduled.  She has been seen by multiple specialties over the last few years for multiple different issues, to include many imaging studies over the last 4 years or so, to include the following:  - 06/2017: CT abdomen/pelvis: Enteritis, mild distal esophageal wall thickening - 01/2019: CT abdomen/pelvis: Tiny nonobstructing bilateral renal stones, otherwise unremarkable - 04/2019: MRI brain: No acute intracranial pathology.  Borderline low-lying cerebellar tonsil 6 mm below foramen magnum, unchanged from 09/2013 (borderline Chiari I malformation) - 05/2019: CT abdomen/pelvis: Normal - 05/2019: CTPA: Normal, no PE -06/2019: CT abdomen/pelvis with urogram: 5 mm liver lesion, representing tiny cysts or tiny biliary hamartomas - 10/2019: Normal stress echo - 06/2020: GES: Mild delayed gastric emptying with 85% emptying at 4 hours (normal 90%) - 08/2020: CT angio: Patent abdominal vasculature, normal visualized GI tract.  Engorgement of left  ovarian vein - 11/2020: CT abdomen/pelvis: Left pelvic and ovarian veins are asymmetrically engorged, supportive of pelvic congestion syndrome.  Otherwise normal GI tract, liver, pancreas, spleen - 11/2020: CTPA: Negative/normal - 11/2020: CT abdomen/pelvis: Coil embolization of the left gonadal vein and stenting of left common and external iliac veins for treatment of May-Thurner syndrome and pelvic congestion syndrome.  Nonspecific moderate fluid-filled dilation of the colon extending from cecum to mid descending without obstruction or ischemia.  Normal liver. - 12/2020: CTPA: Normal - 12/2020: TTE: Normal - 12/2020: CT abdomen/pelvis: Large stool burden of the colon without obstruction, otherwise normal GI tract.  Normal liver, GB, CBD, pancreas - 01/2021: Abdominal ultrasound: Normal liver, GB, CBD  She states she has intermittent RUQ pain for last 2 weeks.  Episodic, acute onset, sharp pain that occurs at random. Lasts a few mins at a time. Not a/w PO intake.   Has chronic nausea for years. Has been on Zofran for years which controls symptoms generally well. No emesis; Occasional "dry heaves". No hematohezia, melena. Does have chronic constipation, generally controlled with Linzess 290 mcg/day. No straining to have BM.   She also  c/o generalized pruritus without rash, along with decreased energy and "rapid weight gain" over last few months. PCM started hydrochlorothiazide.  Endoscopic History: - EGD (09/2015): Normal, empiric esophageal dilation, biopsy negative for EOE, H. pylori   Past Medical History:  Diagnosis Date   Anxiety    Anxiety with depression 08/03/2019   Delivery of pregnancy by cesarean section    Depression    Hypothyroidism      Past Surgical History:  Procedure Laterality Date  CESAREAN SECTION     IR RADIOLOGIST EVAL & MGMT  08/23/2020   Family History  Problem Relation Age of Onset   Hypertension Mother    Diabetes Mother    Hypertension Father     Diabetes Father    Diabetes Maternal Grandmother    Hypertension Maternal Grandmother    Stroke Maternal Grandmother    Diabetes Maternal Grandfather    Hypertension Maternal Grandfather    Stroke Maternal Grandfather    Diabetes Paternal Grandmother    Hypertension Paternal Grandmother    Stroke Paternal Grandmother    Diabetes Paternal Grandfather    Hypertension Paternal Grandfather    Stroke Paternal Grandfather    Social History   Tobacco Use   Smoking status: Never   Smokeless tobacco: Never  Substance Use Topics   Alcohol use: Not Currently   Drug use: Never   Current Outpatient Medications  Medication Sig Dispense Refill   albuterol (PROVENTIL) (2.5 MG/3ML) 0.083% nebulizer solution Take 3 mLs (2.5 mg total) by nebulization every 6 (six) hours as needed for wheezing or shortness of breath. 150 mL 1   ALPRAZolam (XANAX) 0.5 MG tablet Take 1 tablet (0.5 mg total) by mouth daily as needed. Please use sparingly and only for severe anxiety. 15 tablet 0   buPROPion (WELLBUTRIN XL) 150 MG 24 hr tablet Take 3 tablets (450 mg total) by mouth daily. Needs follow up appointment. 270 tablet 0   cyanocobalamin (,VITAMIN B-12,) 1000 MCG/ML injection Inject 1,000 mg into the muscle every 30 (thirty) days.     DULoxetine (CYMBALTA) 30 MG capsule TAKE 1 CAPSULE BY MOUTH EVERY DAY 30 capsule 1   etonogestrel (NEXPLANON) 68 MG IMPL implant Inject into the skin.     hydrochlorothiazide (MICROZIDE) 12.5 MG capsule TAKE 1 CAPSULE BY MOUTH EVERY DAY 30 capsule 1   levothyroxine (SYNTHROID) 75 MCG tablet Take one tablet by mouth daily on Mondays, Tuesdays, Wednesdays, Thursdays and Fridays.  Take one and one-half tablet on Saturdays and Sundays. 102 tablet 1   linaclotide (LINZESS) 290 MCG CAPS capsule Take 290 mcg by mouth daily.     Multiple Vitamin (MULTIVITAMIN ADULT PO) Take 1 tablet by mouth daily.     NURTEC 75 MG TBDP Take 1 tablet by mouth daily as needed.     ondansetron (ZOFRAN-ODT) 8  MG disintegrating tablet Take 1 tablet (8 mg total) by mouth every 8 (eight) hours as needed for nausea. 20 tablet 0   Prenatal Vit-Fe Fumarate-FA (PRENATAL 1+1 PO) Take by mouth.     topiramate (TOPAMAX) 200 MG tablet Take 200 mg by mouth 2 (two) times daily.     No current facility-administered medications for this visit.   Allergies  Allergen Reactions   Penicillins Itching and Rash    Patient feels itching in mouth. Patient feels itching in mouth.     Review of Systems: All systems reviewed and negative except where noted in HPI.     Physical Exam:    Wt Readings from Last 3 Encounters:  12/13/20 143 lb (64.9 kg)  08/04/20 131 lb (59.4 kg)  07/20/20 130 lb 12.8 oz (59.3 kg)    There were no vitals taken for this visit. Constitutional:  Pleasant, in no acute distress. Psychiatric: Normal mood and affect. Behavior is normal. Cardiovascular: Normal rate, regular rhythm. No edema Pulmonary/chest: Effort normal and breath sounds normal. No wheezing, rales or rhonchi. Abdominal: Soft, nondistended, nontender. Bowel sounds active throughout. There are no masses palpable. No hepatomegaly. Neurological:  Alert and oriented to person place and time. Skin: Skin is warm and dry. No rashes noted.   ASSESSMENT AND PLAN;   1) Nausea 2) RUQ Pain 3) Pruritiis 4) Elevated ALT>AST  36 year old female who has had an extensive radiographic work-up for varying issues over the last several years.  Imaging generally with normal appearing GI tract, and constipation on the most recent CT.  Aside from one CT mentioning 5 mm benign-appearing hepatic lesion, liver, CBD, GB, pancreas, spleen all unremarkable.  CT angio with patent, normal intra-abdominal vasculature.  Most recent RUQ [place] with normal-appearing liver, CBD, GB as well.  Discussed GI symptomatology at length with plan to work-up as below:  - Repeat liver enzymes.  If uptrending, plan for extended serologic work-up - Urinary  porphyrins, PBG, spot creatinine - Do not see a role for upper endoscopy/colonoscopy at this juncture - HIDA scan to r/o biliary dyskinesia for recent onset RUQ pain  5) Abnormal MRI brain - Previous MRI brain x2 with borderline Chiari I malformation.  Unclear if this is playing a role with her chronic nausea - Referral to [facility] for evaluation  6) Chronic constipation - Continue Linzess - Continue adequate hydration - Constipation otherwise well controlled on current therapy  RTC in 3 months or sooner as needed  I spent 60 minutes of time, including in depth chart review, independent review of results as outlined above, communicating results with the patient directly, face-to-face time with the patient, coordinating care, ordering studies and medications as appropriate, and documentation.     Lavena Bullion, DO, FACG  02/08/2021, 2:33 PM   Samuel Bouche, NP

## 2021-02-09 ENCOUNTER — Encounter: Payer: Self-pay | Admitting: Gastroenterology

## 2021-02-09 LAB — HEPATIC FUNCTION PANEL
ALT: 11 U/L (ref 0–35)
AST: 11 U/L (ref 0–37)
Albumin: 4.5 g/dL (ref 3.5–5.2)
Alkaline Phosphatase: 57 U/L (ref 39–117)
Bilirubin, Direct: 0.1 mg/dL (ref 0.0–0.3)
Total Bilirubin: 0.3 mg/dL (ref 0.2–1.2)
Total Protein: 7.1 g/dL (ref 6.0–8.3)

## 2021-02-10 LAB — PORPHOBILINOGEN, RANDOM URINE: Porphobilinogen, Rand Ur: 1 mg/L (ref 0.0–2.0)

## 2021-02-13 ENCOUNTER — Other Ambulatory Visit (INDEPENDENT_AMBULATORY_CARE_PROVIDER_SITE_OTHER): Payer: Medicaid Other

## 2021-02-13 DIAGNOSIS — R110 Nausea: Secondary | ICD-10-CM

## 2021-02-13 DIAGNOSIS — R748 Abnormal levels of other serum enzymes: Secondary | ICD-10-CM

## 2021-02-13 DIAGNOSIS — R1011 Right upper quadrant pain: Secondary | ICD-10-CM | POA: Diagnosis not present

## 2021-02-13 DIAGNOSIS — L299 Pruritus, unspecified: Secondary | ICD-10-CM | POA: Diagnosis not present

## 2021-02-14 ENCOUNTER — Encounter: Payer: Self-pay | Admitting: Gastroenterology

## 2021-02-14 LAB — PORPHYRINS, FRACTIONATED, RANDOM URINE
COPROPORPHYRIN I: 21 mcg/g creat (ref 6.5–33.2)
Coproporphyrin III (PORFRU): 42.1 mcg/g creat (ref 4.8–88.6)
HEPTACARBOXYPORPHYRIN: 1.4 mcg/g creat (ref <=3.4)
TOTAL PORPHYRINS: 80.6 mcg/g creat (ref 27.0–153.6)
Uroporphyrin I: 12.5 mcg/g creat (ref 3.6–21.1)
Uroporphyrin III (PORFRU): 3.6 mcg/g creat (ref <=5.6)

## 2021-02-14 LAB — CREATININE CLEARANCE, URINE, 24 HOUR
Creatinine, Ser: 0.8 mg/dL (ref 0.57–1.00)
Creatinine, Urine: 97.4 mg/dL
eGFR: 100 mL/min/{1.73_m2} (ref >59)

## 2021-02-16 ENCOUNTER — Ambulatory Visit (HOSPITAL_COMMUNITY): Admission: RE | Admit: 2021-02-16 | Payer: Medicaid Other | Source: Ambulatory Visit

## 2021-02-21 ENCOUNTER — Other Ambulatory Visit: Payer: Self-pay | Admitting: Medical-Surgical

## 2021-02-22 ENCOUNTER — Other Ambulatory Visit: Payer: Self-pay | Admitting: Medical-Surgical

## 2021-02-22 ENCOUNTER — Telehealth: Payer: Self-pay | Admitting: Gastroenterology

## 2021-02-22 ENCOUNTER — Encounter: Payer: Self-pay | Admitting: Gastroenterology

## 2021-02-22 DIAGNOSIS — R110 Nausea: Secondary | ICD-10-CM

## 2021-02-22 NOTE — Telephone Encounter (Signed)
Done. Patient is informed.

## 2021-02-22 NOTE — Telephone Encounter (Signed)
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 Please advise.

## 2021-02-23 ENCOUNTER — Other Ambulatory Visit: Payer: Self-pay

## 2021-02-23 DIAGNOSIS — R110 Nausea: Secondary | ICD-10-CM

## 2021-02-23 MED ORDER — ONDANSETRON 8 MG PO TBDP: 8.0000 mg | ORAL_TABLET | Freq: Three times a day (TID) | ORAL | 0 refills | Status: DC | PRN

## 2021-02-23 NOTE — Telephone Encounter (Signed)
Spoke with pt to see what pharmacy she wanted to use. Pt stated that when [facility] contacted her to set up an appt they told her she needed an MRI within the last year before the doctors would see her. Pt stated that she has an appt on the 23rd but wanted to let us know that [facility] stated she needed another MRI. Told pt I would find out more information and let her know.

## 2021-02-27 NOTE — Progress Notes (Addendum)
°  HPI with pertinent ROS:   CC: medication review  HPI: Pleasant 36 y.o. female presenting for medication review. Has been taking Wellbutrin 150mg  three times a day as well as cymbalta 30mg . Reports positive effect when she started the cymbalta, and wellbutrin but does not feel like it is helping anymore. States that she is crying everyday and is shutting every one out and her family is noticing. She saw a counselor in 2015-2016 when she had a loss in her family.She started a new job. She reports increase in stress and a lot going on and she feels like she is "back to square one" with her mood. Has tried different medications in the past that were not effective or adverse effects. Reports her sister started taking Viibryd and would like to discuss if this is an option and switching off of Wellbutrin. Denies SI/HI.   May thurners syndrome- reports HCTZ helping some with swelling, but is still having some swelling while taking medication as well as increased BP.   I reviewed the past medical history, family history, social history, surgical history, and allergies today and no changes were needed.  Please see the problem list section below in epic for further details.   Physical exam:   General: Well Developed, well nourished, and in no acute distress.  Neuro: Alert and oriented x3.  HEENT: Normocephalic, atraumatic.  Skin: Warm and dry. Cardiac: Regular rate and rhythm, no murmurs rubs or gallops, no lower extremity edema.  Respiratory: Clear to auscultation bilaterally. Not using accessory muscles, speaking in full sentences.  Impression and Recommendations:    1. Anxiety with depression Discussed a wellbutrin taper in order to come off of medication. Discussed increasing cymbalta vs starting Viibryd and patient would like to keep Cymbalta at current dosage and start Viibryd. Will start Viibryd at 10mg  and increase to 20mg  after first week. Discussed side effects and risk of  dependence on xanax and mentioned trying klonopin as needed. Patient would like to wait and see how she does on Viibryd and will reach out if she needs something PRN. Discussed Genesight testing, patient would like to do this for future reference.  Discussed getting in with a counselor  2. May-Thurner syndrome Doing well on HCTZ with no side effect, but still having some swelling. Will increase to HCTZ 25mg    Return in 4-6 weeks for mood follow up.   ___________________________________________ 04-11-1985, Student NP

## 2021-02-28 ENCOUNTER — Ambulatory Visit: Payer: Medicaid Other | Admitting: Medical-Surgical

## 2021-02-28 ENCOUNTER — Encounter: Payer: Self-pay | Admitting: Medical-Surgical

## 2021-02-28 ENCOUNTER — Other Ambulatory Visit: Payer: Self-pay

## 2021-02-28 VITALS — BP 148/86 | HR 88 | Ht 62.0 in | Wt 141.0 lb

## 2021-02-28 DIAGNOSIS — F418 Other specified anxiety disorders: Secondary | ICD-10-CM | POA: Diagnosis not present

## 2021-02-28 DIAGNOSIS — I871 Compression of vein: Secondary | ICD-10-CM

## 2021-02-28 MED ORDER — VIIBRYD STARTER PACK 10 & 20 MG PO KIT: 1.0000 | PACK | Freq: Every day | ORAL | 0 refills | Status: DC

## 2021-02-28 MED ORDER — DULOXETINE HCL 30 MG PO CPEP: 30.0000 mg | ORAL_CAPSULE | Freq: Every day | ORAL | 1 refills | Status: DC

## 2021-02-28 MED ORDER — BUPROPION HCL ER (XL) 150 MG PO TB24: ORAL_TABLET | ORAL | 0 refills | Status: DC

## 2021-02-28 MED ORDER — HYDROCHLOROTHIAZIDE 25 MG PO TABS: 25.0000 mg | ORAL_TABLET | Freq: Every day | ORAL | 1 refills | Status: DC

## 2021-02-28 NOTE — Progress Notes (Signed)
Medical screening examination/treatment was performed by qualified nurse practitioner student and as supervising provider I was immediately available for consultation/collaboration. I have reviewed documentation and agree with assessment and plan. ° °Rosanna Bickle L. Sanav Remer, DNP, APRN, FNP-BC ° [facility]  °Primary Care and Sports Medicine ° °

## 2021-03-01 ENCOUNTER — Encounter (HOSPITAL_COMMUNITY)
Admission: RE | Admit: 2021-03-01 | Discharge: 2021-03-01 | Disposition: A | Discharge status: Home / Self Care | Payer: Medicaid Other | Source: Ambulatory Visit | Attending: Gastroenterology | Admitting: Gastroenterology

## 2021-03-01 ENCOUNTER — Telehealth: Payer: Self-pay

## 2021-03-01 ENCOUNTER — Telehealth: Payer: Self-pay | Admitting: Gastroenterology

## 2021-03-01 ENCOUNTER — Encounter (HOSPITAL_COMMUNITY): Payer: Self-pay

## 2021-03-01 DIAGNOSIS — R110 Nausea: Secondary | ICD-10-CM | POA: Diagnosis not present

## 2021-03-01 DIAGNOSIS — R1011 Right upper quadrant pain: Secondary | ICD-10-CM | POA: Insufficient documentation

## 2021-03-01 DIAGNOSIS — L299 Pruritus, unspecified: Secondary | ICD-10-CM | POA: Diagnosis not present

## 2021-03-01 DIAGNOSIS — R748 Abnormal levels of other serum enzymes: Secondary | ICD-10-CM | POA: Insufficient documentation

## 2021-03-01 NOTE — Telephone Encounter (Signed)
Marcelino Duster from [facility] nuclear medicine called to inform Dr. Carloyn Manner that patient left before they could get the IV consult there for patient.

## 2021-03-01 NOTE — Telephone Encounter (Signed)
Called pt and gave pt number to reschedule.  Pt also wanted to let Dr. Barron Alvine know that she is going to [facility] tomorrow but will be seeing a PA. She stated she saw [facility]. Loren[facility] Courier [facility] [place] and he stated that she needed decompression surgery. Told pt I would let Dr. Barron Alvine know.

## 2021-03-01 NOTE — Progress Notes (Signed)
IVT consulted for difficult PIV placement.  Upon arrival, pt was no longer here.

## 2021-03-02 DIAGNOSIS — E039 Hypothyroidism, unspecified: Secondary | ICD-10-CM | POA: Diagnosis not present

## 2021-03-02 DIAGNOSIS — G935 Compression of brain: Secondary | ICD-10-CM | POA: Diagnosis not present

## 2021-03-02 DIAGNOSIS — R110 Nausea: Secondary | ICD-10-CM | POA: Diagnosis not present

## 2021-03-02 DIAGNOSIS — F319 Bipolar disorder, unspecified: Secondary | ICD-10-CM | POA: Diagnosis not present

## 2021-03-02 DIAGNOSIS — Z8669 Personal history of other diseases of the nervous system and sense organs: Secondary | ICD-10-CM | POA: Diagnosis not present

## 2021-03-02 DIAGNOSIS — R519 Headache, unspecified: Secondary | ICD-10-CM | POA: Diagnosis not present

## 2021-03-02 DIAGNOSIS — F419 Anxiety disorder, unspecified: Secondary | ICD-10-CM | POA: Diagnosis not present

## 2021-03-02 NOTE — Telephone Encounter (Signed)
Thank you for that update.  Will also be interested to see what [facility] recommends.  Can plan to reschedule HIDA when she is ready.

## 2021-03-03 DIAGNOSIS — H04123 Dry eye syndrome of bilateral lacrimal glands: Secondary | ICD-10-CM | POA: Diagnosis not present

## 2021-03-03 DIAGNOSIS — R519 Headache, unspecified: Secondary | ICD-10-CM | POA: Diagnosis not present

## 2021-03-03 DIAGNOSIS — G935 Compression of brain: Secondary | ICD-10-CM | POA: Diagnosis not present

## 2021-03-04 ENCOUNTER — Encounter: Payer: Self-pay | Admitting: Medical-Surgical

## 2021-03-06 ENCOUNTER — Other Ambulatory Visit: Payer: Self-pay | Admitting: Medical-Surgical

## 2021-03-06 MED ORDER — VIIBRYD STARTER PACK 10 & 20 MG PO KIT: 1.0000 | PACK | Freq: Every day | ORAL | 0 refills | Status: DC

## 2021-03-10 ENCOUNTER — Encounter: Payer: Self-pay | Admitting: Medical-Surgical

## 2021-03-14 ENCOUNTER — Encounter: Payer: Self-pay | Admitting: Medical-Surgical

## 2021-03-17 ENCOUNTER — Telehealth: Payer: Self-pay | Admitting: Medical-Surgical

## 2021-03-17 ENCOUNTER — Other Ambulatory Visit: Payer: Self-pay

## 2021-03-17 ENCOUNTER — Other Ambulatory Visit: Payer: Self-pay | Admitting: Medical-Surgical

## 2021-03-17 MED ORDER — DULOXETINE HCL 30 MG PO CPEP: 30.0000 mg | ORAL_CAPSULE | Freq: Every day | ORAL | 0 refills | Status: DC

## 2021-03-17 MED ORDER — BUPROPION HCL ER (XL) 150 MG PO TB24: ORAL_TABLET | ORAL | 0 refills | Status: DC

## 2021-03-17 NOTE — Telephone Encounter (Signed)
Tried to call back patient and number listed from caller and no answer. Need clarification before we send anymore scripts to the pharmacy. Mychart message sent to patient.  ? ?

## 2021-03-17 NOTE — Telephone Encounter (Signed)
Prescription sent with note to pharmacy to not run under insurance.  ?

## 2021-03-17 NOTE — Telephone Encounter (Signed)
Spouse called. Mrs. Fleer misplaced/accidentally threw away her script for Cymbalta. He said that he can already tell she's off of this med. I spoke to Okey Regal, [employer]. She said Ander Slade can rewrite script but insurance will not cover the cost for another 3 mnths  because it is too soon to refill med. She said he can try using [facility] to get a discount of med. I gave Mr. Gage Carol's message  and he wants Joy to rewrite script. He would like script to be written asap. ?

## 2021-03-17 NOTE — Telephone Encounter (Signed)
Pt's father called back and said the actual medication that he needs is Wellbutrin.  ?

## 2021-03-17 NOTE — Telephone Encounter (Signed)
Spoke with the patients father and clarified that the medication is Wellbutrin. I have sent the Rx to the pharmacy and a note to the pharmacy advising them not to file insurance and to use a Good Rx coupon. ?

## 2021-03-19 ENCOUNTER — Other Ambulatory Visit: Payer: Self-pay | Admitting: Gastroenterology

## 2021-03-19 DIAGNOSIS — R110 Nausea: Secondary | ICD-10-CM

## 2021-03-21 ENCOUNTER — Telehealth: Payer: Self-pay

## 2021-03-21 NOTE — Telephone Encounter (Signed)
Attempted to contact pt.  Phone rang several times then disconnected.  Tiajuana Amass, CMA ?

## 2021-03-21 NOTE — Telephone Encounter (Addendum)
Initiated Prior authorization DXA:JOINOMVEHM HCl (VIIBRYD STARTER PACK) 10 & 20 MG KIT ?CNO:[id number] [facility] Standard Drug Request Form ?Case/Key:N/A ?Status: approved as of 03/21/21 ?Reason: ? ?[phone] ?[phone] ?

## 2021-03-21 NOTE — Telephone Encounter (Signed)
MyChart message sent to pt by Lemont Fillers with information regarding PA.  Charyl Bigger, CMA ?

## 2021-03-23 ENCOUNTER — Other Ambulatory Visit: Payer: Self-pay

## 2021-03-23 ENCOUNTER — Ambulatory Visit: Payer: Medicaid Other | Admitting: Obstetrics and Gynecology

## 2021-03-23 ENCOUNTER — Encounter: Payer: Self-pay | Admitting: Obstetrics and Gynecology

## 2021-03-23 VITALS — BP 118/78 | HR 67 | Ht 62.0 in | Wt 140.0 lb

## 2021-03-23 DIAGNOSIS — M5030 Other cervical disc degeneration, unspecified cervical region: Secondary | ICD-10-CM | POA: Diagnosis not present

## 2021-03-23 DIAGNOSIS — Z8669 Personal history of other diseases of the nervous system and sense organs: Secondary | ICD-10-CM | POA: Diagnosis not present

## 2021-03-23 DIAGNOSIS — Z3046 Encounter for surveillance of implantable subdermal contraceptive: Secondary | ICD-10-CM

## 2021-03-23 DIAGNOSIS — R0602 Shortness of breath: Secondary | ICD-10-CM | POA: Diagnosis not present

## 2021-03-23 DIAGNOSIS — M5136 Other intervertebral disc degeneration, lumbar region: Secondary | ICD-10-CM | POA: Diagnosis not present

## 2021-03-23 DIAGNOSIS — Z8709 Personal history of other diseases of the respiratory system: Secondary | ICD-10-CM | POA: Diagnosis not present

## 2021-03-23 DIAGNOSIS — M5106 Intervertebral disc disorders with myelopathy, lumbar region: Secondary | ICD-10-CM | POA: Diagnosis not present

## 2021-03-23 DIAGNOSIS — G935 Compression of brain: Secondary | ICD-10-CM | POA: Diagnosis not present

## 2021-03-23 DIAGNOSIS — Z716 Tobacco abuse counseling: Secondary | ICD-10-CM | POA: Diagnosis not present

## 2021-03-23 DIAGNOSIS — M4712 Other spondylosis with myelopathy, cervical region: Secondary | ICD-10-CM | POA: Diagnosis not present

## 2021-03-23 DIAGNOSIS — R079 Chest pain, unspecified: Secondary | ICD-10-CM | POA: Diagnosis not present

## 2021-03-23 DIAGNOSIS — F17210 Nicotine dependence, cigarettes, uncomplicated: Secondary | ICD-10-CM | POA: Diagnosis not present

## 2021-03-23 DIAGNOSIS — R002 Palpitations: Secondary | ICD-10-CM | POA: Diagnosis not present

## 2021-03-23 DIAGNOSIS — Z30017 Encounter for initial prescription of implantable subdermal contraceptive: Secondary | ICD-10-CM

## 2021-03-23 DIAGNOSIS — Z79899 Other long term (current) drug therapy: Secondary | ICD-10-CM | POA: Diagnosis not present

## 2021-03-23 DIAGNOSIS — I10 Essential (primary) hypertension: Secondary | ICD-10-CM | POA: Diagnosis not present

## 2021-03-23 DIAGNOSIS — M4716 Other spondylosis with myelopathy, lumbar region: Secondary | ICD-10-CM | POA: Diagnosis not present

## 2021-03-23 MED ORDER — ETONOGESTREL 68 MG ~~LOC~~ IMPL
68.0000 mg | DRUG_IMPLANT | Freq: Once | SUBCUTANEOUS | Status: AC
  Administered 2021-03-23: 68 mg via SUBCUTANEOUS

## 2021-03-23 NOTE — Progress Notes (Signed)
37 yo here for nexplanon removal and replacement ? ?Patient given informed consent, signed copy in the chart, time out was performed. Pregnancy test was negative. ? ?Removal and replacement ?Patient given informed consent for removal of her Nexplanon, time out was performed.  Signed copy in the chart.  Appropriate time out taken. Nexplanon site identified in the left arm.  Area prepped in usual sterile fashon. One cc of 1% lidocaine was used to anesthetize the area at the distal end of the implant. A small stab incision was made right beside the implant on the distal portion.  The Nexplanon rod was grasped using hemostats and removed without difficulty.  There was less than 3 cc blood loss. There were no complications.   ?Patient's left arm was then prepped and draped in the usual sterile fashion. Nexplanon removed form packaging.  Device confirmed in needle, then inserted full length of needle and withdrawn per handbook instructions using the previously made incision.  Patient insertion site covered with a Band-Aid and a pressure bandage was applied to reduce any bruising. .   Minimal blood loss.  Patient tolerated the procedure well.  ? ?Patient declined STI testing ?Patient reports normal pap smear last year ? ? ?

## 2021-03-23 NOTE — Progress Notes (Signed)
Pt is requesting Nexplanon removal and replace- will schedule annual  ?

## 2021-03-26 ENCOUNTER — Other Ambulatory Visit: Payer: Self-pay | Admitting: Medical-Surgical

## 2021-03-31 ENCOUNTER — Other Ambulatory Visit: Payer: Self-pay | Admitting: Medical-Surgical

## 2021-04-03 ENCOUNTER — Encounter: Payer: Self-pay | Admitting: Medical-Surgical

## 2021-04-03 NOTE — Telephone Encounter (Signed)
LEFT VOICEMAIL for patient to call back to get an appointment scheduled. AM ?

## 2021-04-04 ENCOUNTER — Ambulatory Visit (HOSPITAL_COMMUNITY): Admission: RE | Admit: 2021-04-04 | Payer: Medicaid Other | Source: Ambulatory Visit

## 2021-04-06 ENCOUNTER — Ambulatory Visit: Payer: Medicaid Other | Admitting: Obstetrics and Gynecology

## 2021-04-07 DIAGNOSIS — I50813 Acute on chronic right heart failure: Secondary | ICD-10-CM | POA: Diagnosis not present

## 2021-04-17 NOTE — Progress Notes (Signed)
   Complete physical exam  Patient: Yesenia Harper   DOB: 10/28/1996   36 y.o. Female  MRN: [id number]  Subjective:    No chief complaint on file.   Yesenia Harper is a 36 y.o. female who presents today for a complete physical exam. She reports consuming a {diet types:17450} diet. {types:19826} She generally feels {DESC; WELL/FAIRLY WELL/POORLY:18703}. She reports sleeping {DESC; WELL/FAIRLY WELL/POORLY:18703}. She {does/does not:200015} have additional problems to discuss today.    Most recent fall risk assessment:    07/05/2021   10:42 AM  Fall Risk   Falls in the past year? 0  Number falls in past yr: 0  Injury with Fall? 0  Risk for fall due to : No Fall Risks  Follow up Falls evaluation completed     Most recent depression screenings:    07/05/2021   10:42 AM 05/26/2020   10:46 AM  PHQ 2/9 Scores  PHQ - 2 Score 0 0  PHQ- 9 Score 5     {VISON DENTAL STD PSA (Optional):27386}  {History (Optional):23778}  Patient Care Team: Kierston Plasencia, NP as PCP - General (Nurse Practitioner)   Outpatient Medications Prior to Visit  Medication Sig   fluticasone (FLONASE) 50 MCG/ACT nasal spray Place 2 sprays into both nostrils in the morning and at bedtime. After 7 days, reduce to once daily.   norgestimate-ethinyl estradiol (SPRINTEC 28) 0.25-35 MG-MCG tablet Take 1 tablet by mouth daily.   Nystatin POWD Apply liberally to affected area 2 times per day   spironolactone (ALDACTONE) 100 MG tablet Take 1 tablet (100 mg total) by mouth daily.   No facility-administered medications prior to visit.    ROS        Objective:     There were no vitals taken for this visit. {Vitals History (Optional):23777}  Physical Exam   No results found for any visits on 08/10/21. {Show previous labs (optional):23779}    Assessment & Plan:    Routine Health Maintenance and Physical Exam  Immunization History  Administered Date(s) Administered   DTaP 01/11/1999, 03/09/1999,  05/18/1999, 02/01/2000, 08/17/2003   Hepatitis A 06/13/2007, 06/18/2008   Hepatitis B 10/29/1998, 12/06/1998, 05/18/1999   HiB (PRP-OMP) 01/11/1999, 03/09/1999, 05/18/1999, 02/01/2000   IPV 01/11/1999, 03/09/1999, 11/06/1999, 08/17/2003   Influenza,inj,Quad PF,6+ Mos 09/18/2013   Influenza-Unspecified 12/19/2011   MMR 11/05/2000, 08/17/2003   Meningococcal Polysaccharide 06/18/2011   Pneumococcal Conjugate-13 02/01/2000   Pneumococcal-Unspecified 05/18/1999, 08/01/1999   Tdap 06/18/2011   Varicella 11/06/1999, 06/13/2007    Health Maintenance  Topic Date Due   HIV Screening  Never done   Hepatitis C Screening  Never done   INFLUENZA VACCINE  08/08/2021   PAP-Cervical Cytology Screening  08/10/2021 (Originally 10/28/2019)   PAP SMEAR-Modifier  08/10/2021 (Originally 10/28/2019)   TETANUS/TDAP  08/10/2021 (Originally 06/17/2021)   HPV VACCINES  Discontinued   COVID-19 Vaccine  Discontinued    Discussed health benefits of physical activity, and encouraged her to engage in regular exercise appropriate for her age and condition.  Problem List Items Addressed This Visit   None Visit Diagnoses     Annual physical exam    -  Primary   Cervical cancer screening       Need for Tdap vaccination          No follow-ups on file.     Talmage Teaster, NP   

## 2021-04-18 ENCOUNTER — Ambulatory Visit: Payer: Self-pay | Admitting: Medical-Surgical

## 2021-04-18 DIAGNOSIS — R42 Dizziness and giddiness: Secondary | ICD-10-CM

## 2021-04-18 DIAGNOSIS — F5101 Primary insomnia: Secondary | ICD-10-CM

## 2021-04-18 DIAGNOSIS — Z91199 Patient's noncompliance with other medical treatment and regimen due to unspecified reason: Secondary | ICD-10-CM

## 2021-04-18 DIAGNOSIS — M549 Dorsalgia, unspecified: Secondary | ICD-10-CM

## 2021-04-21 ENCOUNTER — Other Ambulatory Visit: Payer: Self-pay

## 2021-04-21 ENCOUNTER — Emergency Department (HOSPITAL_BASED_OUTPATIENT_CLINIC_OR_DEPARTMENT_OTHER): Payer: Medicaid Other

## 2021-04-21 ENCOUNTER — Emergency Department (HOSPITAL_BASED_OUTPATIENT_CLINIC_OR_DEPARTMENT_OTHER)
Admission: EM | Admit: 2021-04-21 | Discharge: 2021-04-21 | Disposition: A | Discharge status: Home / Self Care | Payer: Medicaid Other | Attending: Emergency Medicine | Admitting: Emergency Medicine

## 2021-04-21 ENCOUNTER — Encounter (HOSPITAL_BASED_OUTPATIENT_CLINIC_OR_DEPARTMENT_OTHER): Payer: Self-pay

## 2021-04-21 ENCOUNTER — Emergency Department (INDEPENDENT_AMBULATORY_CARE_PROVIDER_SITE_OTHER)
Admission: RE | Admit: 2021-04-21 | Discharge: 2021-04-21 | Disposition: A | Discharge status: Home / Self Care | Payer: Medicaid Other | Source: Ambulatory Visit | Attending: Family Medicine | Admitting: Family Medicine

## 2021-04-21 VITALS — BP 123/83 | HR 93 | Temp 99.0°F | Resp 18 | Ht 62.0 in | Wt 142.0 lb

## 2021-04-21 DIAGNOSIS — M79662 Pain in left lower leg: Secondary | ICD-10-CM | POA: Diagnosis not present

## 2021-04-21 DIAGNOSIS — R0602 Shortness of breath: Secondary | ICD-10-CM | POA: Insufficient documentation

## 2021-04-21 DIAGNOSIS — M7989 Other specified soft tissue disorders: Secondary | ICD-10-CM | POA: Diagnosis not present

## 2021-04-21 DIAGNOSIS — M79605 Pain in left leg: Secondary | ICD-10-CM

## 2021-04-21 DIAGNOSIS — R079 Chest pain, unspecified: Secondary | ICD-10-CM

## 2021-04-21 DIAGNOSIS — M549 Dorsalgia, unspecified: Secondary | ICD-10-CM | POA: Insufficient documentation

## 2021-04-21 DIAGNOSIS — R0789 Other chest pain: Secondary | ICD-10-CM | POA: Diagnosis not present

## 2021-04-21 DIAGNOSIS — R0600 Dyspnea, unspecified: Secondary | ICD-10-CM | POA: Diagnosis not present

## 2021-04-21 DIAGNOSIS — G935 Compression of brain: Secondary | ICD-10-CM | POA: Diagnosis not present

## 2021-04-21 LAB — BASIC METABOLIC PANEL
Anion gap: 6 (ref 5–15)
BUN: 19 mg/dL (ref 6–20)
CO2: 25 mmol/L (ref 22–32)
Calcium: 8.9 mg/dL (ref 8.9–10.3)
Chloride: 110 mmol/L (ref 98–111)
Creatinine, Ser: 0.93 mg/dL (ref 0.44–1.00)
GFR, Estimated: >60 mL/min (ref >60)
Glucose, Bld: 98 mg/dL (ref 70–99)
Potassium: 3.2 mmol/L — ABNORMAL LOW (ref 3.5–5.1)
Sodium: 141 mmol/L (ref 135–145)

## 2021-04-21 LAB — CBC WITH DIFFERENTIAL/PLATELET
Abs Immature Granulocytes: 0.03 10*3/uL (ref 0.00–0.07)
Basophils Absolute: 0.1 10*3/uL (ref 0.0–0.1)
Basophils Relative: 1 %
Eosinophils Absolute: 0.1 10*3/uL (ref 0.0–0.5)
Eosinophils Relative: 1 %
HCT: 38.1 % (ref 36.0–46.0)
Hemoglobin: 13 g/dL (ref 12.0–15.0)
Immature Granulocytes: 0 %
Lymphocytes Relative: 32 %
Lymphs Abs: 2.7 10*3/uL (ref 0.7–4.0)
MCH: 30.7 pg (ref 26.0–34.0)
MCHC: 34.1 g/dL (ref 30.0–36.0)
MCV: 90.1 fL (ref 80.0–100.0)
Monocytes Absolute: 0.6 10*3/uL (ref 0.1–1.0)
Monocytes Relative: 7 %
Neutro Abs: 5 10*3/uL (ref 1.7–7.7)
Neutrophils Relative %: 59 %
Platelets: 315 10*3/uL (ref 150–400)
RBC: 4.23 MIL/uL (ref 3.87–5.11)
RDW: 12.4 % (ref 11.5–15.5)
WBC: 8.4 10*3/uL (ref 4.0–10.5)
nRBC: 0 % (ref 0.0–0.2)

## 2021-04-21 LAB — TROPONIN I (HIGH SENSITIVITY): Troponin I (High Sensitivity): <2 ng/L (ref <18)

## 2021-04-21 LAB — BRAIN NATRIURETIC PEPTIDE: B Natriuretic Peptide: 11 pg/mL (ref 0.0–100.0)

## 2021-04-21 LAB — D-DIMER, QUANTITATIVE: D-Dimer, Quant: 0.47 ug/mL-FEU (ref 0.00–0.50)

## 2021-04-21 MED ORDER — OXYCODONE-ACETAMINOPHEN 5-325 MG PO TABS
1.0000 | ORAL_TABLET | Freq: Once | ORAL | Status: AC
  Administered 2021-04-21: 1 via ORAL
  Filled 2021-04-21: qty 1

## 2021-04-21 MED ORDER — POTASSIUM CHLORIDE CRYS ER 20 MEQ PO TBCR
40.0000 meq | EXTENDED_RELEASE_TABLET | Freq: Once | ORAL | Status: AC
  Administered 2021-04-21: 40 meq via ORAL
  Filled 2021-04-21: qty 2

## 2021-04-21 MED ORDER — METHOCARBAMOL 500 MG PO TABS: 500.0000 mg | ORAL_TABLET | Freq: Two times a day (BID) | ORAL | 0 refills | Status: DC

## 2021-04-21 NOTE — ED Provider Notes (Signed)
?[facility] ? ? ? ?CSN: [id number] ?Arrival date & time: 04/21/21  1503 ? ? ?  ? ?History   ?Chief Complaint ?Chief Complaint  ?Patient presents with  ? Leg Pain  ?  Left leg swelling, know lower left leg below calf - Entered by patient  ? Leg Swelling  ? ? ?HPI ?Yesenia Harper is a 36 y.o. female.  ? ?Patient complains of onset last night of left lower leg pain/swelling, and chest tightness and shortness of breath. ?She has a past history of pelvic pain, ultimately leading to a diagnosis of May-Thurner Syndrome.  She is s/p angioplasty and stenting of the left common and external iliac veins with overlapping Abre stents. ? ?The history is provided by the patient.  ? ?Past Medical History:  ?Diagnosis Date  ? Anxiety   ? Anxiety with depression 08/03/2019  ? Delivery of pregnancy by cesarean section   ? Depression   ? Hypothyroidism   ? ? ?Patient Active Problem List  ? Diagnosis Date Noted  ? May-Thurner syndrome 12/13/2020  ? History of DVT (deep vein thrombosis) 07/05/2020  ? Scoliosis 12/18/2019  ? Depression   ? Delivery of pregnancy by cesarean section   ? Anxiety   ? Hypothyroidism 08/03/2019  ? Dry cough 08/03/2019  ? Anxiety with depression 08/03/2019  ? ? ?Past Surgical History:  ?Procedure Laterality Date  ? CESAREAN SECTION    ? IR RADIOLOGIST EVAL & MGMT  08/23/2020  ? ? ?OB History   ? ? Gravida  ?2  ? Para  ?2  ? Term  ?2  ? Preterm  ?   ? AB  ?   ? Living  ?2  ?  ? ? SAB  ?   ? IAB  ?   ? Ectopic  ?   ? Multiple  ?   ? Live Births  ?   ?   ?  ?  ? ? ? ?Home Medications   ? ?Prior to Admission medications   ?Medication Sig Start Date End Date Taking? Authorizing Provider  ?buPROPion (WELLBUTRIN XL) 150 MG 24 hr tablet Taper instructions: 450mg every other day/300mg every other day x 2 weeks, then 300mg daily x 2 weeks, then 300mg every other day/150mg every other day x 2 weeks, then 150mg daily x 2 weeks, then 150mg every other day x 2 weeks, then STOP. 03/17/21   Jessup, Joy, NP   ?cyanocobalamin (,VITAMIN B-12,) 1000 MCG/ML injection Inject 1,000 mg into the muscle every 30 (thirty) days. 05/26/19   [provider]  ?etonogestrel (NEXPLANON) 68 MG IMPL implant Inject into the skin.    [provider]  ?hydrochlorothiazide (HYDRODIURIL) 25 MG tablet Take 1 tablet (25 mg total) by mouth daily. 02/28/21   Jessup, Joy, NP  ?levothyroxine (SYNTHROID) 75 MCG tablet Take one tablet by mouth daily on Mondays, Tuesdays, Wednesdays, Thursdays and Fridays.  Take one and one-half tablet on Saturdays and Sundays. 01/10/21   Jessup, Joy, NP  ?linaclotide (LINZESS) 290 MCG CAPS capsule Take 290 mcg by mouth daily.    [provider]  ?Multiple Vitamin (MULTIVITAMIN ADULT PO) Take 1 tablet by mouth daily.    [provider]  ?NURTEC 75 MG TBDP Take 1 tablet by mouth daily as needed. 07/28/19   [provider]  ?ondansetron (ZOFRAN-ODT) 8 MG disintegrating tablet TAKE 1 TABLET BY MOUTH EVERY 8 HOURS AS NEEDED FOR NAUSEA 03/20/21   Cirigliano, Vito V, DO  ?Prenatal Vit-Fe Fumarate-FA (PRENATAL 1+1   PO) Take by mouth.    [provider]  ?topiramate (TOPAMAX) 200 MG tablet Take 200 mg by mouth 2 (two) times daily. 07/21/19   [provider]  ?Vilazodone HCl (VIIBRYD STARTER PACK) 10 & 20 MG KIT Take 1 tablet by mouth daily in the afternoon. Use as directed, 4 weeks QS 03/06/21   Samuel Bouche, NP  ? ? ?Family History ?Family History  ?Problem Relation Age of Onset  ? Hypertension Mother   ? Diabetes Mother   ? Colon polyps Mother   ? Hypertension Father   ? Diabetes Father   ? Colon polyps Father   ? Diabetes Maternal Grandmother   ? Hypertension Maternal Grandmother   ? Stroke Maternal Grandmother   ? Diabetes Maternal Grandfather   ? Hypertension Maternal Grandfather   ? Stroke Maternal Grandfather   ? Diabetes Paternal Grandmother   ? Hypertension Paternal Grandmother   ? Stroke Paternal Grandmother   ? Diabetes Paternal Grandfather   ? Hypertension  Paternal Grandfather   ? Stroke Paternal Grandfather   ? Colon cancer Neg Hx   ? Pancreatic cancer Neg Hx   ? Stomach cancer Neg Hx   ? Esophageal cancer Neg Hx   ? Liver cancer Neg Hx   ? ? ?Social History ?Social History  ? ?Tobacco Use  ? Smoking status: Never  ? Smokeless tobacco: Never  ?Vaping Use  ? Vaping Use: Never used  ?Substance Use Topics  ? Alcohol use: Not Currently  ? Drug use: Never  ? ? ? ?Allergies   ?Penicillins ? ? ?Review of Systems ?Review of Systems  ?Constitutional:  Positive for activity change and fatigue. Negative for appetite change, chills, diaphoresis and fever.  ?HENT: Negative.    ?Eyes: Negative.   ?Respiratory:  Positive for chest tightness and shortness of breath. Negative for cough, wheezing and stridor.   ?Cardiovascular:  Positive for leg swelling. Negative for palpitations.  ?Gastrointestinal: Negative.   ?Genitourinary: Negative.   ?Musculoskeletal: Negative.   ?Skin: Negative.   ?Neurological:  Negative for headaches.  ?Hematological:  Negative for adenopathy.  ? ? ?Physical Exam ?Triage Vital Signs ?ED Triage Vitals  ?Enc Vitals Group  ?   BP 04/21/21 1525 123/83  ?   Pulse Rate 04/21/21 1525 93  ?   Resp 04/21/21 1525 18  ?   Temp 04/21/21 1525 99 ?F (37.2 ?C)  ?   Temp Source 04/21/21 1525 Oral  ?   SpO2 04/21/21 1525 98 %  ?   Weight 04/21/21 1526 142 lb (64.4 kg)  ?   Height 04/21/21 1526 5' 2" (1.575 m)  ?   Head Circumference --   ?   Peak Flow --   ?   Pain Score 04/21/21 1525 6  ?   Pain Loc --   ?   Pain Edu? --   ?   Excl. in [place]? --   ? ?No data found. ? ?Updated Vital Signs ?BP 123/83 (BP Location: Left Arm)   Pulse 93   Temp 99 ?F (37.2 ?C) (Oral)   Resp 18   Ht 5' 2" (1.575 m)   Wt 64.4 kg   SpO2 98%   BMI 25.97 kg/m?  ? ?Visual Acuity ?Right Eye Distance:   ?Left Eye Distance:   ?Bilateral Distance:   ? ?Right Eye Near:   ?Left Eye Near:    ?Bilateral Near:    ? ?Physical Exam ?Vitals and nursing note reviewed.  ?Constitutional:   ?  General: She is  not in acute distress. ?   Appearance: She is not ill-appearing.  ?HENT:  ?   Head: Normocephalic.  ?   Mouth/Throat:  ?   Mouth: Mucous membranes are moist.  ?Eyes:  ?   Pupils: Pupils are equal, round, and reactive to light.  ?Cardiovascular:  ?   Rate and Rhythm: Normal rate and regular rhythm.  ?   Heart sounds: Normal heart sounds.  ?Pulmonary:  ?   Breath sounds: Normal breath sounds.  ?Abdominal:  ?   General: Abdomen is flat.  ?   Palpations: Abdomen is soft.  ?   Tenderness: There is no abdominal tenderness.  ?Musculoskeletal:     ?   General: Tenderness present. No swelling.  ?   Right lower leg: No edema.  ?   Left lower leg: No edema.  ?     Legs: ? ?   Comments: Left posterior calf tenderness to palpation as noted on diagram.  Also mild posterior thigh tenderness. ?   ?Lymphadenopathy:  ?   Cervical: No cervical adenopathy.  ?Skin: ?   General: Skin is warm and dry.  ?   Findings: No rash.  ?Neurological:  ?   Mental Status: She is alert.  ? ? ? ?UC Treatments / Results  ?Labs ?(all labs ordered are listed, but only abnormal results are displayed) ?Labs Reviewed - No data to display ? ?EKG ? ? ?Radiology ?No results found. ? ?Procedures ?Procedures (including critical care time) ? ?Medications Ordered in UC ?Medications - No data to display ? ?Initial Impression / Assessment and Plan / UC Course  ?I have reviewed the triage vital signs and the nursing notes. ? ?Pertinent labs & imaging results that were available during my care of the patient were reviewed by me and considered in my medical decision making (see chart for details). ? ?  ?Recommend left lower unilateral venous US to rule out DVT.  However, US technician no longer available at this hour.  Advised patient to proceed immediately to [facility] ED for further evaluation. ? ?Final Clinical Impressions(s) / UC Diagnoses  ? ?Final diagnoses:  ?Acute leg pain, left  ?Dyspnea, unspecified type  ? ?Discharge Instructions   ?None ?  ? ?ED  Prescriptions   ?None ?  ? ? ?  ?,  A, MD ?04/22/21 2055 ? ?

## 2021-04-21 NOTE — ED Triage Notes (Signed)
Left leg swelling  and pain, SOB started yesterday. HX DVT ?

## 2021-04-21 NOTE — ED Notes (Signed)
Teaching done with Pt. And husband of Pt. To encourage Pt. With her recent diagnosis of CHF and other health issues.  Pt. Discouraged with her health care and feeling of not getting the teaching from her Cardiologist and other providers she needs.  Pt. Is in no distress but has multiple concerns for her health care needs. ? ?PA C Phineas Real. In to see pt. ?

## 2021-04-21 NOTE — ED Notes (Signed)
Per Pt. Pain in L leg x 2 days ago and into the L foot with c/o shob.  No distress noted in Pt.   ?

## 2021-04-21 NOTE — ED Provider Notes (Signed)
?[facility] ?Provider Note ? ? ?CSN: [id number] ?Arrival date & time: 04/21/21  1750 ? ?  ? ?History ? ?Chief Complaint  ?Patient presents with  ? Leg Pain  ? ? ?Yesenia Harper is a 36 y.o. female. ? ? ?Leg Pain ?Patient is a 36 year old female with past medical history significant for May Thurner and treated for DVT with DOAC.  She completed full course and has had a stent placed in her left lower extremity vein. ? ?It seems that she has had several episodes since the stent was placed where she had shortness of breath and chest pain or back pain that is sharp and pleuritic in nature. ? ?Twice before she has had CT PE studies which were negative.  She is continuing to follow-up with her cardiologist and vascular doctor however she was told to come to the ER should she experience any return of the symptoms. ? ?She denies any hemoptysis, recent surgery. ? ?She states that she has however had some left lower extremity swelling she states that it is mild. ?She denies any numbness or weakness in her left lower extremity.  She denies any trauma. ? ? ?She states that the pain and shortness of breath she experiences is nonexertional but rather more pleuritic.  ? ? ?  ? ?Home Medications ?Prior to Admission medications   ?Medication Sig Start Date End Date Taking? Authorizing Provider  ?methocarbamol (ROBAXIN) 500 MG tablet Take 1 tablet (500 mg total) by mouth 2 (two) times daily. 04/21/21  Yes Wyndell Cardiff, Ova Freshwater S, PA  ?buPROPion (WELLBUTRIN XL) 150 MG 24 hr tablet Taper instructions: 462m every other day/3068mevery other day x 2 weeks, then 3002maily x 2 weeks, then 300m67mery other day/150mg51mry other day x 2 weeks, then 150mg 30my x 2 weeks, then 150mg e61m other day x 2 weeks, then STOP. 03/17/21   Jessup,Samuel Bouchecyanocobalamin (,VITAMIN B-12,) 1000 MCG/ML injection Inject 1,000 mg into the muscle every 30 (thirty) days. 05/26/19   [provider]  ?etonogestrel (NEXPLANON)  68 MG IMPL implant Inject into the skin.    [provider]  ?hydrochlorothiazide (HYDRODIURIL) 25 MG tablet Take 1 tablet (25 mg total) by mouth daily. 02/28/21   Jessup,Samuel Bouchelevothyroxine (SYNTHROID) 75 MCG tablet Take one tablet by mouth daily on Mondays, Tuesdays, Wednesdays, Thursdays and Fridays.  Take one and one-half tablet on Saturdays and Sundays. 01/10/21   Jessup,Samuel Bouchelinaclotide (LINZESS) 290 MCG CAPS capsule Take 290 mcg by mouth daily.    [provider]  ?Multiple Vitamin (MULTIVITAMIN ADULT PO) Take 1 tablet by mouth daily.    [provider]  ?NURTEC 75 MG TBDP Take 1 tablet by mouth daily as needed. 07/28/19   [provider]  ?ondansetron (ZOFRAN-ODT) 8 MG disintegrating tablet TAKE 1 TABLET BY MOUTH EVERY 8 HOURS AS NEEDED FOR NAUSEA 03/20/21   Cirigliano, Vito V, DO  ?Prenatal Vit-Fe Fumarate-FA (PRENATAL 1+1 PO) Take by mouth.    [provider]  ?topiramate (TOPAMAX) 200 MG tablet Take 200 mg by mouth 2 (two) times daily. 07/21/19   [provider]  ?Vilazodone HCl (VIIBRYD STARTER PACK) 10 & 20 MG KIT Take 1 tablet by mouth daily in the afternoon. Use as directed, 4 weeks QS 03/06/21   Jessup,Samuel Bouche   ? ?Allergies    ?Penicillins   ? ?Review of Systems   ?Review of Systems ? ?Physical Exam ?Updated  Vital Signs ?BP 124/80   Pulse 70   Temp 98.1 ?F (36.7 ?C) (Oral)   Resp 17   Ht '5\' 2"'  (1.575 m)   Wt 64 kg   LMP 04/07/2021   SpO2 100%   BMI 25.79 kg/m?  ?Physical Exam ?Vitals and nursing note reviewed.  ?Constitutional:   ?   General: She is not in acute distress. ?   Comments: Pleasant 36 year old female able to answer questions appropriately and follow commands  ?HENT:  ?   Head: Normocephalic and atraumatic.  ?   Nose: Nose normal.  ?Eyes:  ?   General: No scleral icterus. ?Cardiovascular:  ?   Rate and Rhythm: Normal rate and regular rhythm.  ?   Pulses: Normal pulses.  ?   Heart sounds: Normal heart sounds.   ?Pulmonary:  ?   Effort: Pulmonary effort is normal. No respiratory distress.  ?   Breath sounds: No wheezing.  ?Abdominal:  ?   Palpations: Abdomen is soft.  ?   Tenderness: There is no abdominal tenderness. There is no guarding or rebound.  ?Musculoskeletal:  ?   Cervical back: Normal range of motion.  ?   Comments: Some left calf tenderness.  I cannot appreciate any edema and there is no pitting edema.  ?Skin: ?   General: Skin is warm and dry.  ?   Capillary Refill: Capillary refill takes less than 2 seconds.  ?Neurological:  ?   Mental Status: She is alert. Mental status is at baseline.  ?Psychiatric:     ?   Mood and Affect: Mood normal.     ?   Behavior: Behavior normal.  ? ? ?ED Results / Procedures / Treatments   ?Labs ?(all labs ordered are listed, but only abnormal results are displayed) ?Labs Reviewed  ?BASIC METABOLIC PANEL - Abnormal; Notable for the following components:  ?    Result Value  ? Potassium 3.2 (*)   ? All other components within normal limits  ?CBC WITH DIFFERENTIAL/PLATELET  ?BRAIN NATRIURETIC PEPTIDE  ?D-DIMER, QUANTITATIVE  ?TROPONIN I (HIGH SENSITIVITY)  ?TROPONIN I (HIGH SENSITIVITY)  ? ? ?EKG ?EKG Interpretation ? ?Date/Time:  Friday April 21 2021 21:41:31 EDT ?Ventricular Rate:  82 ?PR Interval:  174 ?QRS Duration: 89 ?QT Interval:  377 ?QTC Calculation: 441 ?R Axis:   77 ?Text Interpretation: Sinus rhythm similar to 2021 Confirmed by Sherwood Gambler [phone]) on 04/22/2021 7:58:44 AM ? ?Radiology ?DG Chest 2 View ? ?Result Date: 04/21/2021 ?CLINICAL DATA:  Shortness of breath chest pain. EXAM: CHEST - 2 VIEW COMPARISON:  Chest radiograph dated July 01, 2020 FINDINGS: The heart size and mediastinal contours are within normal limits. Both lungs are clear. The visualized skeletal structures are unremarkable. IMPRESSION: No active cardiopulmonary disease. Electronically Signed   By: Keane Police D.O.   On: 04/21/2021 19:51  ? ?US Venous Img Lower Unilateral Left ? ?Result Date:  04/21/2021 ?CLINICAL DATA:  Left leg swelling, history of May-Thurner syndrome, history of DVT EXAM: LEFT LOWER EXTREMITY VENOUS DOPPLER ULTRASOUND TECHNIQUE: Gray-scale sonography with compression, as well as color and duplex ultrasound, were performed to evaluate the deep venous system(s) from the level of the common femoral vein through the popliteal and proximal calf veins. COMPARISON:  12/16/2020 FINDINGS: VENOUS Normal compressibility of the common femoral, superficial femoral, and popliteal veins, as well as the visualized calf veins. Visualized portions of profunda femoral vein and great saphenous vein unremarkable. No filling defects to suggest DVT on grayscale or  color Doppler imaging. Doppler waveforms show normal direction of venous flow, normal respiratory plasticity and response to augmentation. Limited views of the contralateral common femoral vein are unremarkable. OTHER None. Limitations: none IMPRESSION: 1. No evidence of deep venous thrombosis within the left lower extremity. Electronically Signed   By: Randa Ngo M.D.   On: 04/21/2021 19:28   ? ?Procedures ?Procedures  ? ? ?Medications Ordered in ED ?Medications  ?oxyCODONE-acetaminophen (PERCOCET/ROXICET) 5-325 MG per tablet 1 tablet (1 tablet Oral Given 04/21/21 2225)  ?potassium chloride SA (KLOR-CON M) CR tablet 40 mEq (40 mEq Oral Given 04/21/21 2231)  ? ? ?ED Course/ Medical Decision Making/ A&P ?Clinical Course as of 04/22/21 2128  ?Fri Apr 21, 2021  ?2127 3 days of CP SOB ?Sharp left sdied pleuritic [WF]  ?  ?Clinical Course User Index ?[WF] Tedd Sias, [place]  ? ?                        ?Medical Decision Making ?Amount and/or Complexity of Data Reviewed ?Labs: ordered. ?Radiology: ordered. ? ?Risk ?Prescription drug management. ? ? ?This patient presents to the ED for concern of thoracic back pain, shortness of breath, this involves a number of treatment options, and is a complaint that carries with it a high risk of complications and  morbidity.  The differential diagnosis includes  ? ? ?The causes for shortness of breath include but are not limited to ?Cardiac (AHF, pericardial effusion and tamponade, arrhythmias, ischemia, etc) ?Respiratory (COPD, asthma,

## 2021-04-21 NOTE — Discharge Instructions (Addendum)
Please reach out to your cardiologist on Monday. I will send a message to Odis Hollingshead to ensure she knows about your visit today. ?Please monitor your symptoms return to the emergency room for any new or concerning symptoms. ? ?Please use Tylenol or ibuprofen for pain.  You may use 1000 mg of Tylenol every 6 hours.   This would be most effective.  Not to exceed 4 g of Tylenol within 24 hours.  ? ?I have also written you a muscle relaxer for pain. ? ?Your potassium was somewhat low today.  Please have this rechecked with your primary care provider. ?

## 2021-04-21 NOTE — ED Triage Notes (Signed)
Pt arrives with c/o pain, swelling, and knot to back of left leg reports history of DVT. States that she is also having some fatigue and SOB. Pt speaking in full sentences with no distress and O2 on room air is 100%.  ?

## 2021-04-22 ENCOUNTER — Other Ambulatory Visit: Payer: Self-pay | Admitting: Gastroenterology

## 2021-04-22 DIAGNOSIS — R110 Nausea: Secondary | ICD-10-CM

## 2021-04-24 ENCOUNTER — Telehealth: Payer: Self-pay | Admitting: General Practice

## 2021-04-24 NOTE — Telephone Encounter (Signed)
Transition Care Management Unsuccessful Follow-up Telephone Call ? ?Date of discharge and from where:  04/21/21 from [facility] ? ?Attempts:  1st Attempt ? ?Reason for unsuccessful TCM follow-up call:  No answer/busy ? ?  ?

## 2021-04-26 NOTE — Telephone Encounter (Signed)
Transition Care Management Unsuccessful Follow-up Telephone Call ? ?Date of discharge and from where:  04/21/21 from [facility] ? ?Attempts:  2nd Attempt ? ?Reason for unsuccessful TCM follow-up call:  Left voice message patient has OV scheduled with PCP on 05/02/21. ? ?  ?

## 2021-04-28 NOTE — Telephone Encounter (Signed)
Transition Care Management Unsuccessful Follow-up Telephone Call ? ?Date of discharge and from where:  04/21/21 from [facility] med center ? ?Attempts:  3rd Attempt ? ?Reason for unsuccessful TCM follow-up call:  Left voice message ? ?  ?

## 2021-05-01 ENCOUNTER — Ambulatory Visit (HOSPITAL_COMMUNITY): Admission: RE | Admit: 2021-05-01 | Payer: Medicaid Other | Source: Ambulatory Visit

## 2021-05-01 DIAGNOSIS — I491 Atrial premature depolarization: Secondary | ICD-10-CM | POA: Diagnosis not present

## 2021-05-02 ENCOUNTER — Encounter: Payer: Self-pay | Admitting: Medical-Surgical

## 2021-05-02 ENCOUNTER — Telehealth: Payer: Self-pay

## 2021-05-02 ENCOUNTER — Ambulatory Visit: Payer: Medicaid Other | Admitting: Medical-Surgical

## 2021-05-02 VITALS — BP 115/80 | HR 85 | Wt 145.1 lb

## 2021-05-02 DIAGNOSIS — F418 Other specified anxiety disorders: Secondary | ICD-10-CM | POA: Diagnosis not present

## 2021-05-02 DIAGNOSIS — F5101 Primary insomnia: Secondary | ICD-10-CM | POA: Diagnosis not present

## 2021-05-02 MED ORDER — BELSOMRA 20 MG PO TABS
20.0000 mg | ORAL_TABLET | Freq: Every evening | ORAL | 1 refills | Status: DC | PRN
Start: 2021-05-02 — End: 2021-05-22

## 2021-05-02 MED ORDER — VILAZODONE HCL 40 MG PO TABS: 40.0000 mg | ORAL_TABLET | Freq: Every day | ORAL | 1 refills | Status: AC

## 2021-05-02 NOTE — Telephone Encounter (Incomplete Revision)
Initiated Prior authorization [id number] HCl 40MG  tablets ?Via: Covermymeds ?Case/Key: [id number] ?Status: approved  as of 05/11/21 ?Reason: pa has been back dated from 05/10/21-02/10/2022 pt can fill upt to 11 times before needing a ne pa  ? ? ? ? M  ?

## 2021-05-02 NOTE — Progress Notes (Signed)
?  HPI with pertinent ROS:  ? ?CC: insomnia, mood ? ?HPI: ?Pleasant 36 year old female presenting today for concerns with depression/anxiety and insomnia. Is having a very hard time with depression and anxiety right now. She is taking Viibryd 20mg  daily, tolerating well without side effects. Tapered off Wellbutrin successfully. Still taking Cymbalta 30mg  nightly but doesn't feel like it's doing much to help. Admits to crying a lot, having trouble focusing/concentrating, having trouble getting thoughts together. Not working since she could not function in order to get the job done. Has trouble getting to sleep and once she falls asleep, wakes up in a couple of hours. Denies SI/HI.  ? ?I reviewed the past medical history, family history, social history, surgical history, and allergies today and no changes were needed.  Please see the problem list section below in epic for further details. ? ? ?Physical exam:  ? ?General: Well Developed, well nourished, and in no acute distress.  ?Neuro: Alert and oriented x3.  ?HEENT: Normocephalic, atraumatic.  ?Skin: Warm and dry. ?Cardiac: Regular rate and rhythm, no murmurs rubs or gallops, no lower extremity edema.  ?Respiratory: Clear to auscultation bilaterally. Not using accessory muscles, speaking in full sentences. ? ?Impression and Recommendations:   ? ?1. Primary insomnia ?2. Anxiety with depression ?Increase Viibryd to 40mg  daily. Referring for counseling. Recommend to avoid stopping Cymbalta right now but will leave this to her discretion. Discussed options to help with sleep. Starting Bellsomra 20mg  nightly.  ?- Ambulatory referral to Behavioral Health ? ?Return in about 4 weeks (around 05/30/2021) for mood follow up. ?___________________________________________ ? , DNP, APRN, FNP-BC ?Primary Care and Sports Medicine ?[facility] ?

## 2021-05-02 NOTE — Telephone Encounter (Addendum)
Initiated Prior authorization [id number] HCl 40MG  tablets ?Via: Covermymeds ?Case/Key: [id number] ?Status: approved  as of 05/11/21 ?Reason: pa has been back dated from 05/10/21-02/10/2022 pt can fill upt to 11 times before needing a new pa  ? ?

## 2021-05-03 ENCOUNTER — Encounter: Payer: Self-pay | Admitting: Medical-Surgical

## 2021-05-08 ENCOUNTER — Other Ambulatory Visit: Payer: Self-pay | Admitting: Interventional Radiology

## 2021-05-08 DIAGNOSIS — N9489 Other specified conditions associated with female genital organs and menstrual cycle: Secondary | ICD-10-CM

## 2021-05-09 NOTE — Telephone Encounter (Signed)
Our last appointment was a problem visit and not a physical that includes this information. Would like to get her in so I can accurately document any issues that may be going on. Will also need to know if she will require an EKG or any preoperative lab work for the Careers adviser.

## 2021-05-12 ENCOUNTER — Ambulatory Visit: Payer: Medicaid Other | Admitting: Medical-Surgical

## 2021-05-12 ENCOUNTER — Encounter: Payer: Self-pay | Admitting: Medical-Surgical

## 2021-05-12 VITALS — BP 118/79 | HR 79 | Resp 20 | Ht 62.0 in | Wt 147.1 lb

## 2021-05-12 DIAGNOSIS — Z01818 Encounter for other preprocedural examination: Secondary | ICD-10-CM | POA: Diagnosis not present

## 2021-05-12 NOTE — Progress Notes (Signed)
? ?Established Patient Office Visit ? ?Subjective   ?Patient ID: Yesenia Harper, female    DOB: 1985-08-26  Age: 36 y.o. MRN: [id number] ? ?Chief Complaint  ?Patient presents with  ? PRE-OP CLEARANCE  ? ? ?HPI ?Pleasant 36 year old female presenting today for preoperative clearance for an abdominoplasty that is planned for 07/06/2021. ? ?Review of Systems  ?Constitutional:  Positive for malaise/fatigue. Negative for chills and fever.  ?HENT:  Negative for congestion, ear discharge, ear pain, hearing loss, sinus pain, sore throat and tinnitus.   ?Eyes:  Negative for blurred vision and double vision.  ?Respiratory:  Negative for cough, sputum production, shortness of breath and wheezing.   ?Cardiovascular:  Negative for chest pain and palpitations.  ?Gastrointestinal:  Negative for abdominal pain, constipation, diarrhea, heartburn and vomiting.  ?Genitourinary:  Negative for dysuria, frequency and urgency.  ?Skin: Negative.   ?Neurological:  Negative for dizziness and headaches.  ?Psychiatric/Behavioral:  Positive for depression. Negative for suicidal ideas. The patient is nervous/anxious and has insomnia.   ? ?Past Medical History:  ?Diagnosis Date  ? Anxiety   ? Anxiety with depression 08/03/2019  ? Delivery of pregnancy by cesarean section   ? Depression   ? Hypothyroidism   ? ?Past Surgical History:  ?Procedure Laterality Date  ? CESAREAN SECTION    ? IR RADIOLOGIST EVAL & MGMT  08/23/2020  ? ?Allergies as of 05/12/2021   ? ?   Reactions  ? Penicillins Itching, Rash  ? Patient feels itching in mouth. ?Patient feels itching in mouth.  ? ?  ? ?  ?Medication List  ?  ? ?  ? Accurate as of May 12, 2021 12:58 PM. If you have any questions, ask your nurse or doctor.  ?  ?  ? ?  ? ?Belsomra 20 MG Tabs ?Generic drug: Suvorexant ?Take 20 mg by mouth at bedtime as needed. ?  ?cyanocobalamin 1000 MCG/ML injection ?Commonly known as: (VITAMIN B-12) ?Inject 1,000 mg into the muscle every 30 (thirty) days. ?  ?etonogestrel 68 MG Impl  implant ?Commonly known as: NEXPLANON ?Inject into the skin. ?  ?hydrochlorothiazide 25 MG tablet ?Commonly known as: HYDRODIURIL ?Take 1 tablet (25 mg total) by mouth daily. ?  ?levothyroxine 75 MCG tablet ?Commonly known as: SYNTHROID ?Take one tablet by mouth daily on Mondays, Tuesdays, Wednesdays, Thursdays and Fridays.  Take one and one-half tablet on Saturdays and Sundays. ?  ?linaclotide 290 MCG Caps capsule ?Commonly known as: LINZESS ?Take 290 mcg by mouth daily. ?  ?methocarbamol 500 MG tablet ?Commonly known as: ROBAXIN ?Take 1 tablet (500 mg total) by mouth 2 (two) times daily. ?  ?MULTIVITAMIN ADULT PO ?Take 1 tablet by mouth daily. ?  ?Nurtec 75 MG Tbdp ?Generic drug: Rimegepant Sulfate ?Take 1 tablet by mouth daily as needed. ?  ?ondansetron 8 MG disintegrating tablet ?Commonly known as: ZOFRAN-ODT ?TAKE 1 TABLET BY MOUTH EVERY 8 HOURS AS NEEDED FOR NAUSEA ?  ?PRENATAL 1+1 PO ?Take by mouth. ?  ?topiramate 200 MG tablet ?Commonly known as: TOPAMAX ?Take 200 mg by mouth 2 (two) times daily. ?  ?Vilazodone HCl 40 MG Tabs ?Commonly known as: Viibryd ?Take 1 tablet (40 mg total) by mouth daily. ?  ? ?  ? ? ?  ?Objective:  ?  ? ?BP 118/79   Pulse 79   Resp 20   Ht 5\' 2"  (1.575 m)   Wt 147 lb 1.9 oz (66.7 kg)   SpO2 100%   BMI 26.91 kg/m?  ?BP  Readings from Last 3 Encounters:  ?05/12/21 118/79  ?05/02/21 115/80  ?04/21/21 124/80  ? ?Physical Exam ?Constitutional:   ?   General: She is not in acute distress. ?   Appearance: Normal appearance. She is not ill-appearing.  ?HENT:  ?   Head: Normocephalic and atraumatic.  ?   Right Ear: Tympanic membrane normal.  ?   Left Ear: Tympanic membrane normal.  ?   Nose: Nose normal.  ?   Mouth/Throat:  ?   Mouth: Mucous membranes are moist.  ?   Pharynx: No oropharyngeal exudate or posterior oropharyngeal erythema.  ?Eyes:  ?   Extraocular Movements: Extraocular movements intact.  ?   Conjunctiva/sclera: Conjunctivae normal.  ?   Pupils: Pupils are equal, round,  and reactive to light.  ?Neck:  ?   Thyroid: No thyromegaly.  ?   Vascular: No carotid bruit or JVD.  ?   Trachea: Trachea normal.  ?Cardiovascular:  ?   Rate and Rhythm: Normal rate and regular rhythm.  ?   Pulses: Normal pulses.  ?   Heart sounds: Normal heart sounds. No murmur heard. ?  No friction rub. No gallop.  ?Pulmonary:  ?   Effort: Pulmonary effort is normal. No respiratory distress.  ?   Breath sounds: Normal breath sounds. No wheezing.  ?Abdominal:  ?   General: Bowel sounds are normal. There is no distension.  ?   Palpations: Abdomen is soft.  ?   Tenderness: There is no abdominal tenderness. There is no guarding.  ?Musculoskeletal:     ?   General: Normal range of motion.  ?   Cervical back: Normal range of motion and neck supple.  ?Skin: ?   General: Skin is warm and dry.  ?Neurological:  ?   Mental Status: She is alert and oriented to person, place, and time.  ?   Cranial Nerves: No cranial nerve deficit.  ?Psychiatric:     ?   Mood and Affect: Mood normal.     ?   Behavior: Behavior normal.     ?   Thought Content: Thought content normal.     ?   Judgment: Judgment normal.  ? ?No results found for any visits on 05/12/21. ? ? ? ?The ASCVD Risk score (Arnett DK, et al., 2019) failed to calculate for the following reasons: ?  The 2019 ASCVD risk score is only valid for ages 75 to 62 ? ?  ?Assessment & Plan:  ? ?1. Preoperative clearance ?In office twelve-lead EKG showed normal sinus rhythm, rate 84, normal axis.  Labs ordered per surgical preoperative instructions.  Once these are available, we will send office notes, lab results, and required form to the surgical office.  Patient verbalized understanding and is agreeable to the plan. ?- EKG 12-Lead ?- CBC with Differential/Platelet ?- COMPLETE METABOLIC PANEL WITH GFR ?- TSH ?- T3, free ?- T4, free ?- CP4508-PT/INR AND PTT ? ?Return if symptoms worsen or fail to improve.  ? ?___________________________________________ ?Thayer Ohm, DNP, APRN,  FNP-BC ?Primary Care and Sports Medicine ?[facility] ? ? ?

## 2021-05-15 ENCOUNTER — Telehealth: Payer: Self-pay

## 2021-05-15 ENCOUNTER — Encounter: Payer: Self-pay | Admitting: Medical-Surgical

## 2021-05-15 NOTE — Telephone Encounter (Signed)
Faxed Pre-op Clearance to [phone] ? ?Fax confirmation successful ?

## 2021-05-16 LAB — CBC WITH DIFFERENTIAL/PLATELET
Absolute Monocytes: 630 cells/uL (ref 200–950)
Basophils Absolute: 42 cells/uL (ref 0–200)
Basophils Relative: 0.6 %
Eosinophils Absolute: 77 cells/uL (ref 15–500)
Eosinophils Relative: 1.1 %
HCT: 39.1 % (ref 35.0–45.0)
Hemoglobin: 13.1 g/dL (ref 11.7–15.5)
Lymphs Abs: 2149 cells/uL (ref 850–3900)
MCH: 31 pg (ref 27.0–33.0)
MCHC: 33.5 g/dL (ref 32.0–36.0)
MCV: 92.4 fL (ref 80.0–100.0)
MPV: 9 fL (ref 7.5–12.5)
Monocytes Relative: 9 %
Neutro Abs: 4102 cells/uL (ref 1500–7800)
Neutrophils Relative %: 58.6 %
Platelets: 329 10*3/uL (ref 140–400)
RBC: 4.23 10*6/uL (ref 3.80–5.10)
RDW: 12.3 % (ref 11.0–15.0)
Total Lymphocyte: 30.7 %
WBC: 7 10*3/uL (ref 3.8–10.8)

## 2021-05-16 LAB — TSH: TSH: 0.46 mIU/L

## 2021-05-16 LAB — COMPLETE METABOLIC PANEL WITH GFR
AG Ratio: 1.8 (calc) (ref 1.0–2.5)
ALT: 14 U/L (ref 6–29)
AST: 12 U/L (ref 10–30)
Albumin: 4.2 g/dL (ref 3.6–5.1)
Alkaline phosphatase (APISO): 51 U/L (ref 31–125)
BUN: 16 mg/dL (ref 7–25)
CO2: 24 mmol/L (ref 20–32)
Calcium: 8.9 mg/dL (ref 8.6–10.2)
Chloride: 107 mmol/L (ref 98–110)
Creat: 0.78 mg/dL (ref 0.50–0.97)
Globulin: 2.4 g/dL (calc) (ref 1.9–3.7)
Glucose, Bld: 76 mg/dL (ref 65–99)
Potassium: 3.6 mmol/L (ref 3.5–5.3)
Sodium: 140 mmol/L (ref 135–146)
Total Bilirubin: 0.3 mg/dL (ref 0.2–1.2)
Total Protein: 6.6 g/dL (ref 6.1–8.1)
eGFR: 102 mL/min/{1.73_m2} (ref >=60)

## 2021-05-16 LAB — CP4508-PT/INR AND PTT
INR: 1
Prothrombin Time: 10.4 s (ref 9.0–11.5)
aPTT: 30 s (ref 23–32)

## 2021-05-16 LAB — SPECIMEN COMPROMISED

## 2021-05-16 LAB — T4, FREE: Free T4: 1.3 ng/dL (ref 0.8–1.8)

## 2021-05-16 LAB — T3, FREE: T3, Free: 3.1 pg/mL (ref 2.3–4.2)

## 2021-05-17 ENCOUNTER — Telehealth: Payer: Self-pay

## 2021-05-17 NOTE — Telephone Encounter (Addendum)
Initiated Prior authorization TIR:[id number] 20MG  tablets ?Via: Covermymeds ?Case/Key:[id number] ?Status: denied  as of 05/17/21 ?Reason:Flurazepam, Zopidem, and Temazepam are covered alternatives  ?Notified Pt via: Mychart  ?

## 2021-05-19 ENCOUNTER — Encounter: Payer: Self-pay | Admitting: Medical-Surgical

## 2021-05-19 DIAGNOSIS — I871 Compression of vein: Secondary | ICD-10-CM

## 2021-05-22 MED ORDER — ESZOPICLONE 1 MG PO TABS: 1.0000 mg | ORAL_TABLET | Freq: Every evening | ORAL | 0 refills | Status: DC | PRN

## 2021-05-24 ENCOUNTER — Other Ambulatory Visit: Payer: Medicaid Other

## 2021-05-24 ENCOUNTER — Encounter: Payer: Medicaid Other | Admitting: Hematology & Oncology

## 2021-05-26 ENCOUNTER — Other Ambulatory Visit: Payer: Self-pay | Admitting: Family

## 2021-05-26 ENCOUNTER — Telehealth: Payer: Self-pay | Admitting: *Deleted

## 2021-05-26 ENCOUNTER — Telehealth: Payer: Self-pay

## 2021-05-26 DIAGNOSIS — Z86718 Personal history of other venous thrombosis and embolism: Secondary | ICD-10-CM

## 2021-05-26 DIAGNOSIS — M542 Cervicalgia: Secondary | ICD-10-CM | POA: Diagnosis not present

## 2021-05-26 DIAGNOSIS — G3184 Mild cognitive impairment, so stated: Secondary | ICD-10-CM | POA: Diagnosis not present

## 2021-05-26 DIAGNOSIS — I50813 Acute on chronic right heart failure: Secondary | ICD-10-CM | POA: Diagnosis not present

## 2021-05-26 DIAGNOSIS — M545 Low back pain, unspecified: Secondary | ICD-10-CM | POA: Diagnosis not present

## 2021-05-26 DIAGNOSIS — R42 Dizziness and giddiness: Secondary | ICD-10-CM | POA: Diagnosis not present

## 2021-05-26 DIAGNOSIS — D6859 Other primary thrombophilia: Secondary | ICD-10-CM

## 2021-05-26 DIAGNOSIS — G44229 Chronic tension-type headache, not intractable: Secondary | ICD-10-CM | POA: Diagnosis not present

## 2021-05-26 DIAGNOSIS — Z79899 Other long term (current) drug therapy: Secondary | ICD-10-CM | POA: Diagnosis not present

## 2021-05-26 NOTE — Telephone Encounter (Addendum)
Initiated Prior authorization XLK:[id number] 1MG  tablets Via: Covermymeds Case/Key:[id number] Status: denied  as of 05/26/21 Reason:Non formulary  Notified Pt via: Mychart

## 2021-05-26 NOTE — Telephone Encounter (Signed)
Returned call to patient again.  Left message on voice mail to report to ED if "symptoms" she was going to explain worsen.

## 2021-05-26 NOTE — Telephone Encounter (Signed)
Patient called c/o pain and discoloration of lower extremities.  I returned patients call at 2:48 pm and left message on voicemail.

## 2021-05-28 ENCOUNTER — Other Ambulatory Visit: Payer: Self-pay | Admitting: Gastroenterology

## 2021-05-28 DIAGNOSIS — R110 Nausea: Secondary | ICD-10-CM

## 2021-05-29 ENCOUNTER — Inpatient Hospital Stay: Payer: Medicaid Other | Admitting: Family

## 2021-05-29 ENCOUNTER — Encounter: Payer: Self-pay | Admitting: Family

## 2021-05-29 ENCOUNTER — Inpatient Hospital Stay (HOSPITAL_BASED_OUTPATIENT_CLINIC_OR_DEPARTMENT_OTHER): Payer: Medicaid Other | Admitting: Family

## 2021-05-29 ENCOUNTER — Inpatient Hospital Stay: Payer: Medicaid Other | Attending: Hematology & Oncology

## 2021-05-29 ENCOUNTER — Inpatient Hospital Stay: Payer: Medicaid Other

## 2021-05-29 VITALS — BP 131/70 | HR 77 | Temp 98.3°F | Resp 17 | Ht 61.5 in | Wt 152.0 lb

## 2021-05-29 DIAGNOSIS — Z79890 Hormone replacement therapy (postmenopausal): Secondary | ICD-10-CM

## 2021-05-29 DIAGNOSIS — E039 Hypothyroidism, unspecified: Secondary | ICD-10-CM

## 2021-05-29 DIAGNOSIS — Z7901 Long term (current) use of anticoagulants: Secondary | ICD-10-CM

## 2021-05-29 DIAGNOSIS — G935 Compression of brain: Secondary | ICD-10-CM

## 2021-05-29 DIAGNOSIS — I871 Compression of vein: Secondary | ICD-10-CM

## 2021-05-29 DIAGNOSIS — I82402 Acute embolism and thrombosis of unspecified deep veins of left lower extremity: Secondary | ICD-10-CM | POA: Insufficient documentation

## 2021-05-29 DIAGNOSIS — N9489 Other specified conditions associated with female genital organs and menstrual cycle: Secondary | ICD-10-CM | POA: Insufficient documentation

## 2021-05-29 DIAGNOSIS — Z79899 Other long term (current) drug therapy: Secondary | ICD-10-CM | POA: Diagnosis not present

## 2021-05-29 DIAGNOSIS — Z86718 Personal history of other venous thrombosis and embolism: Secondary | ICD-10-CM | POA: Diagnosis not present

## 2021-05-29 DIAGNOSIS — D6859 Other primary thrombophilia: Secondary | ICD-10-CM

## 2021-05-29 DIAGNOSIS — D509 Iron deficiency anemia, unspecified: Secondary | ICD-10-CM

## 2021-05-29 LAB — CBC WITH DIFFERENTIAL (CANCER CENTER ONLY)
Abs Immature Granulocytes: 0.06 10*3/uL (ref 0.00–0.07)
Basophils Absolute: 0 10*3/uL (ref 0.0–0.1)
Basophils Relative: 0 %
Eosinophils Absolute: 0 10*3/uL (ref 0.0–0.5)
Eosinophils Relative: 0 %
HCT: 43.3 % (ref 36.0–46.0)
Hemoglobin: 14.4 g/dL (ref 12.0–15.0)
Immature Granulocytes: 1 %
Lymphocytes Relative: 16 %
Lymphs Abs: 2 10*3/uL (ref 0.7–4.0)
MCH: 31 pg (ref 26.0–34.0)
MCHC: 33.3 g/dL (ref 30.0–36.0)
MCV: 93.3 fL (ref 80.0–100.0)
Monocytes Absolute: 0.7 10*3/uL (ref 0.1–1.0)
Monocytes Relative: 6 %
Neutro Abs: 9.3 10*3/uL — ABNORMAL HIGH (ref 1.7–7.7)
Neutrophils Relative %: 77 %
Platelet Count: 398 10*3/uL (ref 150–400)
RBC: 4.64 MIL/uL (ref 3.87–5.11)
RDW: 12.9 % (ref 11.5–15.5)
WBC Count: 12.1 10*3/uL — ABNORMAL HIGH (ref 4.0–10.5)
nRBC: 0 % (ref 0.0–0.2)

## 2021-05-29 LAB — CMP (CANCER CENTER ONLY)
ALT: 26 U/L (ref 0–44)
AST: 12 U/L — ABNORMAL LOW (ref 15–41)
Albumin: 5.2 g/dL — ABNORMAL HIGH (ref 3.5–5.0)
Alkaline Phosphatase: 57 U/L (ref 38–126)
Anion gap: 8 (ref 5–15)
BUN: 16 mg/dL (ref 6–20)
CO2: 22 mmol/L (ref 22–32)
Calcium: 10.1 mg/dL (ref 8.9–10.3)
Chloride: 107 mmol/L (ref 98–111)
Creatinine: 0.76 mg/dL (ref 0.44–1.00)
GFR, Estimated: >60 mL/min (ref >60)
Glucose, Bld: 95 mg/dL (ref 70–99)
Potassium: 3.6 mmol/L (ref 3.5–5.1)
Sodium: 137 mmol/L (ref 135–145)
Total Bilirubin: 0.4 mg/dL (ref 0.3–1.2)
Total Protein: 8.4 g/dL — ABNORMAL HIGH (ref 6.5–8.1)

## 2021-05-29 LAB — IRON AND IRON BINDING CAPACITY (CC-WL,HP ONLY)
Iron: 181 ug/dL — ABNORMAL HIGH (ref 28–170)
Saturation Ratios: 46 % — ABNORMAL HIGH (ref 10.4–31.8)
TIBC: 396 ug/dL (ref 250–450)
UIBC: 215 ug/dL (ref 148–442)

## 2021-05-29 LAB — ANTITHROMBIN III: AntiThromb III Func: >140 % — ABNORMAL HIGH (ref 75–120)

## 2021-05-29 LAB — FERRITIN: Ferritin: 17 ng/mL (ref 11–307)

## 2021-05-29 NOTE — Progress Notes (Signed)
Hematology/Oncology Consultation   Name: Yesenia Harper      MRN: [id number]    Location: Room/bed info not found  Date: 05/29/2021 Time:12:03 PM   REFERRING PHYSICIAN: Samuel Bouche, NP  REASON FOR CONSULT: May-Thurner Syndrome   DIAGNOSIS: May-Thurner Syndrome  HISTORY OF PRESENT ILLNESS:  Yesenia Harper is a very pleasant 36 yo caucasian female with history of left lower extremity DVT diagnosed initially in 2006 and treated with Coumadin. In November 2022 she was diagnosed with a small left lower extremity non-occlusive thrombus within the left common femoral vein.    She has history of May-Thurner Syndrome   Her grandmother also had history of multiple thrombotic events and passed away from a brain aneurysm.   She is symptomatic with fatigue, SOB, being cold natured, chest discomfort, palpitations and occasional numbness and tingling in her feet.   She is scheduled from an abdominoplasty on 07/06/2021.   She has 2 children and had 2 c-sections. No history of miscarriage.  No personal history of cancer. Both her father and maternal grandfather had history of lung cancer.  She does have hypothyroidism and takes Synthroid.  No history of diabetes.    Appetite and hydration is good. Her weight is 152 lbs.  No smoking, ETOH or recreational drug use.  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   ROS: All other 10 point review of systems is negative.   PAST MEDICAL HISTORY:   Past Medical History:  Diagnosis Date   Anxiety    Anxiety with depression 08/03/2019   Delivery of pregnancy by cesarean section    Depression    Hypothyroidism     ALLERGIES: Allergies  Allergen Reactions   Penicillins Itching and Rash    Patient feels itching in mouth. Patient feels itching in mouth.      MEDICATIONS:  Current Outpatient Medications on File Prior to Visit  Medication Sig Dispense Refill    cyanocobalamin (,VITAMIN B-12,) 1000 MCG/ML injection Inject 1,000 mg into the muscle every 30 (thirty) days.     eszopiclone (LUNESTA) 1 MG TABS tablet Take 1 tablet (1 mg total) by mouth at bedtime as needed for sleep. Take immediately before bedtime 30 tablet 0   etonogestrel (NEXPLANON) 68 MG IMPL implant Inject into the skin.     hydrochlorothiazide (HYDRODIURIL) 25 MG tablet Take 1 tablet (25 mg total) by mouth daily. 90 tablet 1   levothyroxine (SYNTHROID) 75 MCG tablet Take one tablet by mouth daily on Mondays, Tuesdays, Wednesdays, Thursdays and Fridays.  Take one and one-half tablet on Saturdays and Sundays. 102 tablet 1   linaclotide (LINZESS) 290 MCG CAPS capsule Take 290 mcg by mouth daily.     methocarbamol (ROBAXIN) 500 MG tablet Take 1 tablet (500 mg total) by mouth 2 (two) times daily. 20 tablet 0   Multiple Vitamin (MULTIVITAMIN ADULT PO) Take 1 tablet by mouth daily.     NURTEC 75 MG TBDP Take 1 tablet by mouth daily as needed.     ondansetron (ZOFRAN-ODT) 8 MG disintegrating tablet TAKE 1 TABLET BY MOUTH EVERY 8 HOURS AS NEEDED FOR NAUSEA 60 tablet 0   Prenatal Vit-Fe Fumarate-FA (PRENATAL 1+1 PO) Take by mouth.     topiramate (TOPAMAX) 200 MG tablet Take 200 mg by mouth 2 (two) times daily.     Vilazodone HCl (VIIBRYD) 40 MG TABS Take 1 tablet (40 mg total)  by mouth daily. 90 tablet 1   No current facility-administered medications on file prior to visit.     PAST SURGICAL HISTORY Past Surgical History:  Procedure Laterality Date   CESAREAN SECTION     IR RADIOLOGIST EVAL & MGMT  08/23/2020    FAMILY HISTORY: Family History  Problem Relation Age of Onset   Hypertension Mother    Diabetes Mother    Colon polyps Mother    Hypertension Father    Diabetes Father    Colon polyps Father    Diabetes Maternal Grandmother    Hypertension Maternal Grandmother    Stroke Maternal Grandmother    Diabetes Maternal Grandfather    Hypertension Maternal Grandfather    Stroke  Maternal Grandfather    Diabetes Paternal Grandmother    Hypertension Paternal Grandmother    Stroke Paternal Grandmother    Diabetes Paternal Grandfather    Hypertension Paternal Grandfather    Stroke Paternal Grandfather    Colon cancer Neg Hx    Pancreatic cancer Neg Hx    Stomach cancer Neg Hx    Esophageal cancer Neg Hx    Liver cancer Neg Hx     SOCIAL HISTORY:  reports that she has never smoked. She has never used smokeless tobacco. She reports that she does not currently use alcohol. She reports that she does not use drugs.  PERFORMANCE STATUS: The patient's performance status is {CHL ONC ECOG WU:398760  PHYSICAL EXAM: Most Recent Vital Signs: There were no vitals taken for this visit. {Exam, Complete:17964}  LABORATORY DATA:  No results found for this or any previous visit (from the past 48 hour(s)).    RADIOGRAPHY: No results found.     PATHOLOGY: None  ASSESSMENT/PLAN: ***  All questions were answered. The patient knows to call the clinic with any problems, questions or concerns. We can certainly see the patient much sooner if necessary.  The patient was discussed with and also seen by Dr. Marin Olp and he is in agreement with the aforementioned.   Lottie Dawson, NP

## 2021-05-30 ENCOUNTER — Telehealth: Payer: Self-pay | Admitting: *Deleted

## 2021-05-30 ENCOUNTER — Ambulatory Visit
Admission: RE | Admit: 2021-05-30 | Discharge: 2021-05-30 | Disposition: A | Discharge status: Home / Self Care | Payer: Medicaid Other | Source: Ambulatory Visit | Attending: Interventional Radiology | Admitting: Interventional Radiology

## 2021-05-30 ENCOUNTER — Encounter: Payer: Self-pay | Admitting: *Deleted

## 2021-05-30 ENCOUNTER — Encounter (INDEPENDENT_AMBULATORY_CARE_PROVIDER_SITE_OTHER): Payer: Medicaid Other | Admitting: Medical-Surgical

## 2021-05-30 DIAGNOSIS — N9489 Other specified conditions associated with female genital organs and menstrual cycle: Secondary | ICD-10-CM

## 2021-05-30 DIAGNOSIS — I871 Compression of vein: Secondary | ICD-10-CM

## 2021-05-30 HISTORY — PX: IR RADIOLOGIST EVAL & MGMT: IMG5224

## 2021-05-30 LAB — HOMOCYSTEINE: Homocysteine: 7.2 umol/L (ref 0.0–14.5)

## 2021-05-30 MED ORDER — RIVAROXABAN 10 MG PO TABS: 10.0000 mg | ORAL_TABLET | Freq: Every day | ORAL | 0 refills | Status: DC

## 2021-05-30 NOTE — Telephone Encounter (Signed)
Per 05/29/21 los -  Waiting on lab results. To be determined.

## 2021-05-30 NOTE — Progress Notes (Signed)
Chief Complaint: Patient was consulted remotely today (TeleHealth) for left leg swelling at the request of Chandler Stofer K.    Referring Physician(s): Cap Massi K  History of Present Illness: Yesenia Harper is a 36 y.o. female who I first saw back in August 2022.  At that time, she was referred to me by Dr. [name] to assess for possible pelvic congestion syndrome due to a constellation of symptoms including left calf pain, left foot and ankle swelling, left back and flank pain and left lower abdominal pain, heaviness, aching and bloating.  I assessed her and felt she had a high probability of pelvic congestion syndrome with profound reflux in the left ovarian vein.  We felt her left lower extremity symptoms were likely related although there was some mild superficial venous insufficiency as well.  Additionally, as a female of her age, [name] was a consideration.  She did have a history of DVT diagnosed in 2006.  Unfortunately, her insurance would not approve treatment for pelvic congestion syndrome.  Ultimately, she changed jobs and ensures and sought a second opinion at [facility].  Dr. Margaretha Glassing was able to see her and diagnosed her with both pelvic congestion syndrome and [name].  She underwent embolization of the left ovarian vein as well as stenting of the left common and external iliac veins in November 2022.  She felt great following the procedure for approximately 4-6 weeks before developing acute onset of severe pain while she was out of town.  She was assessed at an out-of-town emergency room and told that she had nonocclusive DVT in the common femoral vein.  Unfortunately, she left AMA as [place]  She was not treated with anticoagulation at that time.  Following this she began to develop recurrent left lower extremity edema, heaviness and color changes in her left foot.  She followed up  with Dr. [name] who felt that her interventional therapy was maximized and he had nothing further to offer her.  In April, she had a second left lower extremity DVT which was negative for any evidence of deep venous thrombosis or superficial venous thrombosis.  She was recently seen by hematology to evaluate for a clotting disorder.  She is also seen a plastic surgery and is considering an abdominoplasty but that is on hold until she undergoes complete hematologic work-up.  Additionally, she wants to see me again for a second opinion regarding her recurrent left lower extremity and pelvic symptoms.  Past Medical History:  Diagnosis Date   Anxiety    Anxiety with depression 08/03/2019   Delivery of pregnancy by cesarean section    Depression    Hypothyroidism     Past Surgical History:  Procedure Laterality Date   CESAREAN SECTION     IR RADIOLOGIST EVAL & MGMT  08/23/2020    Allergies: Penicillins  Medications: Prior to Admission medications   Medication Sig Start Date End Date Taking? Authorizing Provider  cyanocobalamin (,VITAMIN B-12,) 1000 MCG/ML injection Inject 1,000 mg into the muscle every 30 (thirty) days. 05/26/19   [provider]  eszopiclone (LUNESTA) 1 MG TABS tablet Take 1 tablet (1 mg total) by mouth at bedtime as needed for sleep. Take immediately before bedtime 05/22/21   Christen Butter, NP  etonogestrel (NEXPLANON) 68 MG IMPL implant Inject into the skin.    [provider]  hydrochlorothiazide (HYDRODIURIL) 25 MG tablet Take 1 tablet (25 mg total)  by mouth daily. 02/28/21   Christen ButterJessup, Joy, NP  levothyroxine (SYNTHROID) 75 MCG tablet Take one tablet by mouth daily on Mondays, Tuesdays, Wednesdays, Thursdays and Fridays.  Take one and one-half tablet on Saturdays and Sundays. 01/10/21   Christen ButterJessup, Joy, NP  linaclotide (LINZESS) 290 MCG CAPS capsule Take 290 mcg by mouth daily.    [provider]  methocarbamol (ROBAXIN) 500 MG tablet Take 1 tablet (500 mg  total) by mouth 2 (two) times daily. Patient not taking: Reported on 05/29/2021 04/21/21   Gailen ShelterFondaw, Wylder S, PA  Multiple Vitamin (MULTIVITAMIN ADULT PO) Take 1 tablet by mouth daily.    [provider]  NURTEC 75 MG TBDP Take 1 tablet by mouth daily as needed. 07/28/19   [provider]  ondansetron (ZOFRAN-ODT) 8 MG disintegrating tablet TAKE 1 TABLET BY MOUTH EVERY 8 HOURS AS NEEDED FOR NAUSEA 05/29/21   Cirigliano, Vito V, DO  potassium chloride SA (KLOR-CON M) 20 MEQ tablet Take 40 mEq by mouth 2 (two) times daily. 03/23/21   [provider]  Prenatal Vit-Fe Fumarate-FA (PRENATAL 1+1 PO) Take by mouth.    [provider]  rivaroxaban (XARELTO) 10 MG TABS tablet Take 1 tablet (10 mg total) by mouth daily. Start 7 days prior to abdominoplasty. Hold two days prior to there procedure. Restart the day after procedure and take for 3 weeks then stop. DVT prophylaxis. 05/30/21   Erenest Blankarter, Sarah M, NP  spironolactone (ALDACTONE) 25 MG tablet Take 25 mg by mouth daily. 04/17/21   [provider]  topiramate (TOPAMAX) 200 MG tablet Take 200 mg by mouth 2 (two) times daily. 07/21/19   [provider]  Vilazodone HCl (VIIBRYD) 40 MG TABS Take 1 tablet (40 mg total) by mouth daily. 05/02/21   Christen ButterJessup, Joy, NP     Family History  Problem Relation Age of Onset   Hypertension Mother    Diabetes Mother    Colon polyps Mother    Hypertension Father    Diabetes Father    Colon polyps Father    Diabetes Maternal Grandmother    Hypertension Maternal Grandmother    Stroke Maternal Grandmother    Diabetes Maternal Grandfather    Hypertension Maternal Grandfather    Stroke Maternal Grandfather    Diabetes Paternal Grandmother    Hypertension Paternal Grandmother    Stroke Paternal Grandmother    Diabetes Paternal Grandfather    Hypertension Paternal Grandfather    Stroke Paternal Grandfather    Colon cancer Neg Hx    Pancreatic cancer Neg Hx    Stomach cancer  Neg Hx    Esophageal cancer Neg Hx    Liver cancer Neg Hx     Social History   Socioeconomic History   Marital status: Married    Spouse name: Not on file   Number of children: 2   Years of education: Not on file   Highest education level: Not on file  Occupational History   Not on file  Tobacco Use   Smoking status: Never   Smokeless tobacco: Never  Vaping Use   Vaping Use: Never used  Substance and Sexual Activity   Alcohol use: Not Currently   Drug use: Never   Sexual activity: Yes    Partners: Male    Birth control/protection: Implant  Other Topics Concern   Not on file  Social History Narrative   Not on file   Social Determinants of Health   Financial Resource Strain: Not on file  Food Insecurity: Not on file  Transportation Needs: Not on file  Physical Activity: Not on file  Stress: Not on file  Social Connections: Not on file    Review of Systems  Review of Systems: A 12 point ROS discussed and pertinent positives are indicated in the HPI above.  All other systems are negative.  Physical Exam No direct physical exam was performed (except for noted visual exam findings with Video Visits).    Vital Signs: There were no vitals taken for this visit.  Imaging: No results found.  Labs:  CBC: Recent Labs    12/13/20 0000 04/21/21 1839 05/12/21 0000 05/29/21 1119  WBC 7.2 8.4 7.0 12.1*  HGB 12.5 13.0 13.1 14.4  HCT 36.7 38.1 39.1 43.3  PLT 342 315 329 398    COAGS: Recent Labs    12/29/20 0939 05/12/21 0000  INR 1.0 1.0  APTT  --  30    BMP: Recent Labs    07/01/20 0000 08/16/20 1529 08/19/20 1245 12/13/20 0000 02/13/21 1136 04/21/21 1839 05/12/21 0000 05/29/21 1119  NA 140  --  138 139  --  141 140 137  K 4.0  --  3.4* 3.9  --  3.2* 3.6 3.6  CL 108  --  109 108  --  110 107 107  CO2 27  --  23 23  --  25 24 22   GLUCOSE 76  --  80 74  --  98 76 95  BUN 13  --  10 17  --  19 16 16   CALCIUM 9.1  --  9.1 8.9  --  8.9 8.9 10.1   CREATININE 1.03   < > 0.88 0.81 0.80 0.93 0.78 0.76  GFRNONAA 71  --  >60  --   --  >60  --  >60  GFRAA 83  --   --   --   --   --   --   --    < > = values in this interval not displayed.    LIVER FUNCTION TESTS: Recent Labs    08/19/20 1245 12/13/20 0000 12/29/20 0939 02/08/21 1622 05/12/21 0000 05/29/21 1119  BILITOT 0.5   < > 0.3 0.3 0.3 0.4  AST 16   < > 32* 11 12 12*  ALT 24   < > 56* 11 14 26   ALKPHOS 45  --   --  57  --  57  PROT 6.8   < > 6.8 7.1 6.6 8.4*  ALBUMIN 4.2  --   --  4.5  --  5.2*   < > = values in this interval not displayed.    TUMOR MARKERS: No results for input(s): AFPTM, CEA, CA199, CHROMGRNA in the last 8760 hours.  Assessment and Plan:  Pleasant 36 year old female with a history of recurrent DVT, [name] status post common and external iliac venous stenting, and pelvic congestion syndrome with left ovarian vein reflux status post left ovarian venous embolization.  Unfortunately, her symptoms are recurrent.  I spoke to her today to provide a second opinion.  At this time, it is unclear to me the status of her prior ovarian vein embolization and stent placement as this was done outside of our system.  She has had no subsequent imaging to confirm successful embolization and patency of the stent.  Additionally, she does also have a history of mild left superficial venous insufficiency.  It is possible that this has become progressive and subsequently more symptomatic  since I last saw her.  I would like to proceed with complete imaging work-up as well as an in person clinical visit in order to have all of the information to render a second opinion.  1.) CT Venogram abd/pelvis to the lesser trochanters  2.) Clinic visit with [place] assessment for superficial venous insufficiency      Electronically Signed: Sterling Big 05/30/2021, 2:40 PM   I spent a total of  15 Minutes in remote  clinical consultation, greater than 50% of which was  counseling/coordinating care for left leg swelling.    Visit type: Audio only (telephone). Audio (no video) only due to patient preference. Alternative for in-person consultation at [facility], [address] [place], [place], [place]. This visit type was conducted due to national recommendations for restrictions regarding the COVID-19 Pandemic (e.g. social distancing).  This format is felt to be most appropriate for this patient at this time.  All issues noted in this document were discussed and addressed.

## 2021-05-31 MED ORDER — HYDROCODONE-ACETAMINOPHEN 5-325 MG PO TABS: 1.0000 | ORAL_TABLET | Freq: Three times a day (TID) | ORAL | 0 refills | Status: AC | PRN

## 2021-05-31 NOTE — Telephone Encounter (Signed)
Please see the MyChart message reply(ies) for my assessment and plan.    This patient gave consent for this Medical Advice Message and is aware that it may result in a bill to their insurance company, as well as the possibility of receiving a bill for a co-payment or deductible. They are an established patient, but are not seeking medical advice exclusively about a problem treated during an in person or video visit in the last seven days. I did not recommend an in person or video visit within seven days of my reply.    I spent a total of 6 minutes cumulative time within 7 days through MyChart messaging.  Noele Icenhour, NP   

## 2021-05-31 NOTE — Progress Notes (Unsigned)
Virtual Visit via Video Note  I connected with Yesenia Harper on 06/01/21 at  1:00 PM EDT by a video enabled telemedicine application and verified that I am speaking with the correct person using two identifiers.   I discussed the limitations of evaluation and management by telemedicine and the availability of in person appointments. The patient expressed understanding and agreed to proceed.  Patient location: home Provider locations: office  Subjective:    CC: mood follow up  HPI: Pleasant 36 year old female presenting today via MyChart video visit for follow up on mood. Approximately 4 weeks ago, we increased her Viibryd to 40mg  daily. She has been taking the increased dose of Viibryd, tolerating well without side effects.  Notes that her depressive symptoms are well managed on this medication however her anxiety symptoms have worsened.  She is having more episodes of palpitations and overwhelming feelings of anxiety with no known trigger.  Feels that her mood swings more frequently now.  Continues to have difficulty sleeping at night.  We did try Belsomra but insurance would not approve this.  Sent in [place] but it is also currently waiting on insurance red tape.  Is interested in an option that will help with sleep at night.  Past medical history, Surgical history, Family history not pertinant except as noted below, Social history, Allergies, and medications have been entered into the medical record, reviewed, and corrections made.   Review of Systems: See HPI for pertinent positives and negatives.   Objective:    General: Speaking clearly in complete sentences without any shortness of breath.  Alert and oriented x3.  Normal judgment. No apparent acute distress.  Impression and Recommendations:    1. Anxiety with depression Continue Viibryd 40 mg daily.  Adding a small dose of Seroquel (25 mg nightly) to help with mood stabilization as well as sleep.  Advised that the lower doses of  Seroquel are not usually associated with significant weight gain but to monitor for this and if necessary we can change it to something else.  I discussed the assessment and treatment plan with the patient. The patient was provided an opportunity to ask questions and all were answered. The patient agreed with the plan and demonstrated an understanding of the instructions.   The patient was advised to call back or seek an in-person evaluation if the symptoms worsen or if the condition fails to improve as anticipated.  25 minutes of non-face-to-face time was provided during this encounter.  Return in about 4 weeks (around 06/29/2021) for mood follow up.  07/01/2021, DNP, APRN, FNP-BC [facility]

## 2021-06-01 ENCOUNTER — Encounter: Payer: Self-pay | Admitting: Medical-Surgical

## 2021-06-01 ENCOUNTER — Telehealth: Payer: Medicaid Other | Admitting: Medical-Surgical

## 2021-06-01 DIAGNOSIS — F418 Other specified anxiety disorders: Secondary | ICD-10-CM | POA: Diagnosis not present

## 2021-06-01 MED ORDER — QUETIAPINE FUMARATE 25 MG PO TABS: 25.0000 mg | ORAL_TABLET | Freq: Every day | ORAL | 0 refills | Status: DC

## 2021-06-02 ENCOUNTER — Encounter: Payer: Self-pay | Admitting: Family

## 2021-06-02 ENCOUNTER — Encounter: Payer: Self-pay | Admitting: Obstetrics and Gynecology

## 2021-06-02 ENCOUNTER — Other Ambulatory Visit: Payer: Self-pay | Admitting: Family

## 2021-06-06 ENCOUNTER — Other Ambulatory Visit: Payer: Self-pay

## 2021-06-06 DIAGNOSIS — R600 Localized edema: Secondary | ICD-10-CM | POA: Insufficient documentation

## 2021-06-06 DIAGNOSIS — R198 Other specified symptoms and signs involving the digestive system and abdomen: Secondary | ICD-10-CM

## 2021-06-06 HISTORY — DX: Other specified symptoms and signs involving the digestive system and abdomen: R19.8

## 2021-06-06 HISTORY — DX: Localized edema: R60.0

## 2021-06-07 ENCOUNTER — Encounter: Payer: Self-pay | Admitting: *Deleted

## 2021-06-07 ENCOUNTER — Encounter (HOSPITAL_COMMUNITY)
Admission: RE | Admit: 2021-06-07 | Discharge: 2021-06-07 | Disposition: A | Discharge status: Home / Self Care | Payer: Medicaid Other | Source: Ambulatory Visit | Attending: Gastroenterology | Admitting: Gastroenterology

## 2021-06-07 ENCOUNTER — Encounter (HOSPITAL_COMMUNITY): Payer: Self-pay

## 2021-06-07 ENCOUNTER — Other Ambulatory Visit: Payer: Self-pay | Admitting: Transplant Surgery

## 2021-06-07 DIAGNOSIS — R079 Chest pain, unspecified: Secondary | ICD-10-CM | POA: Diagnosis not present

## 2021-06-07 DIAGNOSIS — R0602 Shortness of breath: Secondary | ICD-10-CM | POA: Diagnosis not present

## 2021-06-07 DIAGNOSIS — R110 Nausea: Secondary | ICD-10-CM | POA: Insufficient documentation

## 2021-06-07 DIAGNOSIS — I50813 Acute on chronic right heart failure: Secondary | ICD-10-CM | POA: Diagnosis not present

## 2021-06-07 DIAGNOSIS — I10 Essential (primary) hypertension: Secondary | ICD-10-CM | POA: Diagnosis not present

## 2021-06-07 DIAGNOSIS — L299 Pruritus, unspecified: Secondary | ICD-10-CM | POA: Diagnosis not present

## 2021-06-07 DIAGNOSIS — R002 Palpitations: Secondary | ICD-10-CM | POA: Diagnosis not present

## 2021-06-07 DIAGNOSIS — R1011 Right upper quadrant pain: Secondary | ICD-10-CM | POA: Diagnosis not present

## 2021-06-07 DIAGNOSIS — I110 Hypertensive heart disease with heart failure: Secondary | ICD-10-CM | POA: Diagnosis not present

## 2021-06-07 DIAGNOSIS — Z79899 Other long term (current) drug therapy: Secondary | ICD-10-CM | POA: Diagnosis not present

## 2021-06-07 DIAGNOSIS — R748 Abnormal levels of other serum enzymes: Secondary | ICD-10-CM | POA: Diagnosis not present

## 2021-06-07 LAB — PROTEIN S, TOTAL: Protein S Ag, Total: 94 % (ref 60–150)

## 2021-06-07 LAB — CARDIOLIPIN ANTIBODIES, IGG, IGM, IGA
Anticardiolipin IgA: <9 APL U/mL (ref 0–11)
Anticardiolipin IgG: <9 GPL U/mL (ref 0–14)
Anticardiolipin IgM: <9 MPL U/mL (ref 0–12)

## 2021-06-07 LAB — PROTEIN S ACTIVITY: Protein S Activity: 93 % (ref 63–140)

## 2021-06-07 LAB — PROTEIN C ACTIVITY: Protein C Activity: 144 % (ref 73–180)

## 2021-06-07 LAB — LUPUS ANTICOAGULANT PANEL
DRVVT: 29.6 s (ref 0.0–47.0)
PTT Lupus Anticoagulant: 31.6 s (ref 0.0–43.5)

## 2021-06-07 LAB — PROTEIN C, TOTAL: Protein C, Total: 140 % (ref 60–150)

## 2021-06-07 LAB — BETA-2-GLYCOPROTEIN I ABS, IGG/M/A
Beta-2 Glyco I IgG: <9 GPI IgG units (ref 0–20)
Beta-2-Glycoprotein I IgA: <9 GPI IgA units (ref 0–25)
Beta-2-Glycoprotein I IgM: <9 GPI IgM units (ref 0–32)

## 2021-06-07 LAB — FACTOR 5 LEIDEN

## 2021-06-07 LAB — PROTHROMBIN GENE MUTATION

## 2021-06-07 MED ORDER — TECHNETIUM TC 99M MEBROFENIN IV KIT
5.4000 | PACK | Freq: Once | INTRAVENOUS | Status: AC
  Administered 2021-06-07: 5.4 via INTRAVENOUS

## 2021-06-08 DIAGNOSIS — G935 Compression of brain: Secondary | ICD-10-CM | POA: Diagnosis not present

## 2021-06-08 NOTE — Progress Notes (Unsigned)
Cardiology Office Note:    Date:  06/08/2021   ID:  Yesenia Harper, DOB [birth year], MRN [id number]  PCP:  Christen Butter, NP  Cardiologist:  Norman Herrlich, MD    Referring MD: Christen Butter, NP    ASSESSMENT:    No diagnosis found. PLAN:    In order of problems listed above:  ***   Next appointment: ***   Medication Adjustments/Labs and Tests Ordered: Current medicines are reviewed at length with the patient today.  Concerns regarding medicines are outlined above.  No orders of the defined types were placed in this encounter.  No orders of the defined types were placed in this encounter.   No chief complaint on file.   History of Present Illness:    Yesenia Harper is a 36 y.o. female with a hx of chest pain and shortness of breath last seen by me 11/24/2019.  Chart review review: I reviewed her PCP office note 11/13/2019 She had a stress echocardiogram performed 11/06/2019 ,she achieved target heart rate normal exercise capacity and normal stress echo with no evidence of ischemia. She had a 14-day event monitor performed 07/29/2019 showing sinus rhythm with no clinical arrhythmia.  She had triggered events that were associated with sinus rhythm and sinus tachycardia. She was seen [facility] ED 11/13/2019 with chest discomfort.  Testing included CBC normal hemoglobin 12.6 potassium mildly decreased 3.4 creatinine 0.8 GFR 98 cc high-sensitivity troponin was normal D-dimer was normal COVID-19 testing normal chest x-ray normal.  She was discharged from the emergency room.  Her EKG was described as sinus rhythm and normal.   She was seen at [facility] cardiology 07/07/2019 for chest pain it was described as sharp substernal with shortness of breath and palpitation occurring with and without exertion and lasting for hours.  Her physical examination was unremarkable and she has advise further evaluation including a stress echocardiogram.   She presented the emergency room 11/13/2019 stat  EKG sinus rhythm normal onset left without being seen x-ray was also normal.   Further evaluation she had duplex lower extremities May negative for DVT and had a CT angiogram which showed no evidence of pulmonary pathology pulmonary embolism and no aortic atherosclerosis or coronary artery calcification.  Compliance with diet, lifestyle and medications: ***  Further chart review shows an echocardiogram 12/26/2020 [facility] left ventricle normal size function systolic diastolic function right ventricle normal size and function and no valvular stenosis or regurgitation. CTA of the chest pulmonary embolism protocol 12/08/2020 showed no findings of pulmonary embolism or right heart strain.  She was seen [facility] advanced heart failure 03/23/2021 with a history of peripheral arterial disease and angioplasty and stenting left common and external iliac veins for [name] and elevated right atrial pressure 10 mmHg.  She has also noted to have a Chiari malformation.  She is followed by neurosurgery at [facility]. The assessment describes a right atrial pressure is upper limit of normal she is placed on spironolactone to see if it helps with fluid retention she wore an event monitor results that cannot be seen and was advised to follow-up in 3 months.  Laboratory test performed included normal magnesium 2.1 sodium 140 potassium 3.1 proBNP level was less than 50 not consistent with heart failure. Past Medical History:  Diagnosis Date   Anxiety    Anxiety with depression 08/03/2019   Bilateral carpal tunnel syndrome 02/23/2011   Cervical radiculitis 09/17/2013   Chiari malformation type I (HCC) 04/28/2013   Cognitive impairment,  mild, so stated 05/23/2014   Degenerative joint disease of cervical and lumbar spine 05/23/2014   Delivery of pregnancy by cesarean section    Depression    Dry cough 08/03/2019   Epidermal inclusion cyst 04/10/2012   Eye pain 04/24/2013   Fever of unknown  origin (FUO) 11/05/2014   Fluid retention in legs 06/06/2021   Gastrointestinal symptoms 06/06/2021   Formatting of this note might be different from the original.     Last Assessment & Plan:  Formatting of this note might be different from the original.   Hematuria 12/18/2019   History of C-section 05/12/2014   Formatting of this note might be different from the original. 2006 LTCS for breech & NRFHR   History of DVT (deep vein thrombosis) 07/05/2020   Hypothyroidism    Lumbar radiculopathy 09/17/2013   May-Thurner syndrome 12/13/2020   MDD (major depressive disorder), recurrent episode, mild (HCC) 04/04/2012   Migraine headache without aura 04/10/2012   Myopia of both eyes 04/24/2013   Neck pain 09/17/2013   Neuropathy, median nerve 09/17/2013   Pain in joint involving left lower leg 07/05/2020   Pain of left lower extremity 07/05/2020   Scoliosis 12/18/2019   Visual disturbance 04/24/2013    Past Surgical History:  Procedure Laterality Date   CESAREAN SECTION     IR RADIOLOGIST EVAL & MGMT  08/23/2020   IR RADIOLOGIST EVAL & MGMT  05/30/2021    Current Medications: No outpatient medications have been marked as taking for the 06/09/21 encounter (Appointment) with Baldo Daub, MD.     Allergies:   Penicillins   Social History   Socioeconomic History   Marital status: Married    Spouse name: Not on file   Number of children: 2   Years of education: Not on file   Highest education level: Not on file  Occupational History   Not on file  Tobacco Use   Smoking status: Never   Smokeless tobacco: Never  Vaping Use   Vaping Use: Never used  Substance and Sexual Activity   Alcohol use: Not Currently   Drug use: Never   Sexual activity: Yes    Partners: Male    Birth control/protection: Implant  Other Topics Concern   Not on file  Social History Narrative   Not on file   Social Determinants of Health   Financial Resource Strain: Not on file  Food Insecurity: Not on file   Transportation Needs: Not on file  Physical Activity: Not on file  Stress: Not on file  Social Connections: Not on file     Family History: The patient's ***family history includes Colon polyps in her father and mother; Diabetes in her father, maternal grandfather, maternal grandmother, mother, paternal grandfather, and paternal grandmother; Hypertension in her father, maternal grandfather, maternal grandmother, mother, paternal grandfather, and paternal grandmother; Stroke in her maternal grandfather, maternal grandmother, paternal grandfather, and paternal grandmother. There is no history of Colon cancer, Pancreatic cancer, Stomach cancer, Esophageal cancer, or Liver cancer. ROS:   Please see the history of present illness.    All other systems reviewed and are negative.  EKGs/Labs/Other Studies Reviewed:    The following studies were reviewed today:  EKG:  EKG ordered today and personally reviewed.  The ekg ordered today demonstrates ***  Recent Labs: 04/21/2021: B Natriuretic Peptide 11.0 05/12/2021: TSH 0.46 05/29/2021: ALT 26; BUN 16; Creatinine 0.76; Hemoglobin 14.4; Platelet Count 398; Potassium 3.6; Sodium 137  Recent Lipid Panel No results found for:  CHOL, TRIG, HDL, CHOLHDL, VLDL, LDLCALC, LDLDIRECT  Physical Exam:    VS:  LMP 05/24/2021 (Within Days)     Wt Readings from Last 3 Encounters:  05/29/21 152 lb (68.9 kg)  05/12/21 147 lb 1.9 oz (66.7 kg)  05/02/21 145 lb 1.9 oz (65.8 kg)     GEN: *** Well nourished, well developed in no acute distress HEENT: Normal NECK: No JVD; No carotid bruits LYMPHATICS: No lymphadenopathy CARDIAC: ***RRR, no murmurs, rubs, gallops RESPIRATORY:  Clear to auscultation without rales, wheezing or rhonchi  ABDOMEN: Soft, non-tender, non-distended MUSCULOSKELETAL:  No edema; No deformity  SKIN: Warm and dry NEUROLOGIC:  Alert and oriented x 3 PSYCHIATRIC:  Normal affect    Signed, Norman HerrlichBrian Osborne Serio, MD  06/08/2021 5:23 PM    [facility]

## 2021-06-09 ENCOUNTER — Ambulatory Visit: Payer: Medicaid Other | Admitting: Cardiology

## 2021-06-09 ENCOUNTER — Telehealth: Payer: Self-pay | Admitting: Family

## 2021-06-09 NOTE — Telephone Encounter (Signed)
I was able to speak with the patient and go over recent lab results including hyper coag panel and iron studies. Her lab work came back negative for any underlying clotting disorder.  She states that her abdominoplasty has been moved from the end of June to the end of July. She will follow the same plan for DVT prophylaxis with Xarelto as before.  I spoke with Dr. Marin Olp regarding her heart cath next week and up coming decompression and duraplasty with neuro for Chiari malformation type I. The later has not yet been scheduled. No DVT prophylaxis needed with neuro surgery per MD.  Patient will contact our office with any questions or concerns. We will continue to follow-up as needed.

## 2021-06-10 ENCOUNTER — Other Ambulatory Visit: Payer: Self-pay | Admitting: Medical-Surgical

## 2021-06-13 ENCOUNTER — Other Ambulatory Visit: Payer: Self-pay | Admitting: Interventional Radiology

## 2021-06-13 DIAGNOSIS — I871 Compression of vein: Secondary | ICD-10-CM

## 2021-06-13 DIAGNOSIS — N9489 Other specified conditions associated with female genital organs and menstrual cycle: Secondary | ICD-10-CM

## 2021-06-13 DIAGNOSIS — I872 Venous insufficiency (chronic) (peripheral): Secondary | ICD-10-CM

## 2021-06-16 DIAGNOSIS — R0602 Shortness of breath: Secondary | ICD-10-CM | POA: Diagnosis not present

## 2021-06-16 DIAGNOSIS — R0789 Other chest pain: Secondary | ICD-10-CM | POA: Diagnosis not present

## 2021-06-16 HISTORY — PX: CARDIAC CATHETERIZATION: SHX172

## 2021-06-18 ENCOUNTER — Other Ambulatory Visit: Payer: Self-pay | Admitting: Medical-Surgical

## 2021-06-21 DIAGNOSIS — R0602 Shortness of breath: Secondary | ICD-10-CM | POA: Diagnosis not present

## 2021-06-21 DIAGNOSIS — R079 Chest pain, unspecified: Secondary | ICD-10-CM | POA: Diagnosis not present

## 2021-06-21 DIAGNOSIS — G935 Compression of brain: Secondary | ICD-10-CM | POA: Diagnosis not present

## 2021-06-21 DIAGNOSIS — E039 Hypothyroidism, unspecified: Secondary | ICD-10-CM | POA: Diagnosis not present

## 2021-06-21 DIAGNOSIS — Z01812 Encounter for preprocedural laboratory examination: Secondary | ICD-10-CM | POA: Diagnosis not present

## 2021-06-21 DIAGNOSIS — E876 Hypokalemia: Secondary | ICD-10-CM | POA: Diagnosis not present

## 2021-06-21 DIAGNOSIS — F418 Other specified anxiety disorders: Secondary | ICD-10-CM | POA: Diagnosis not present

## 2021-06-22 ENCOUNTER — Encounter: Payer: Self-pay | Admitting: Medical-Surgical

## 2021-06-24 IMAGING — CR DG CHEST 2V
2 series · 2 of 2 positions shown · non-contrast
Comparison: None.

CLINICAL DATA: Shortness of breath

EXAM:
CHEST - 2 VIEW

[chest pa]
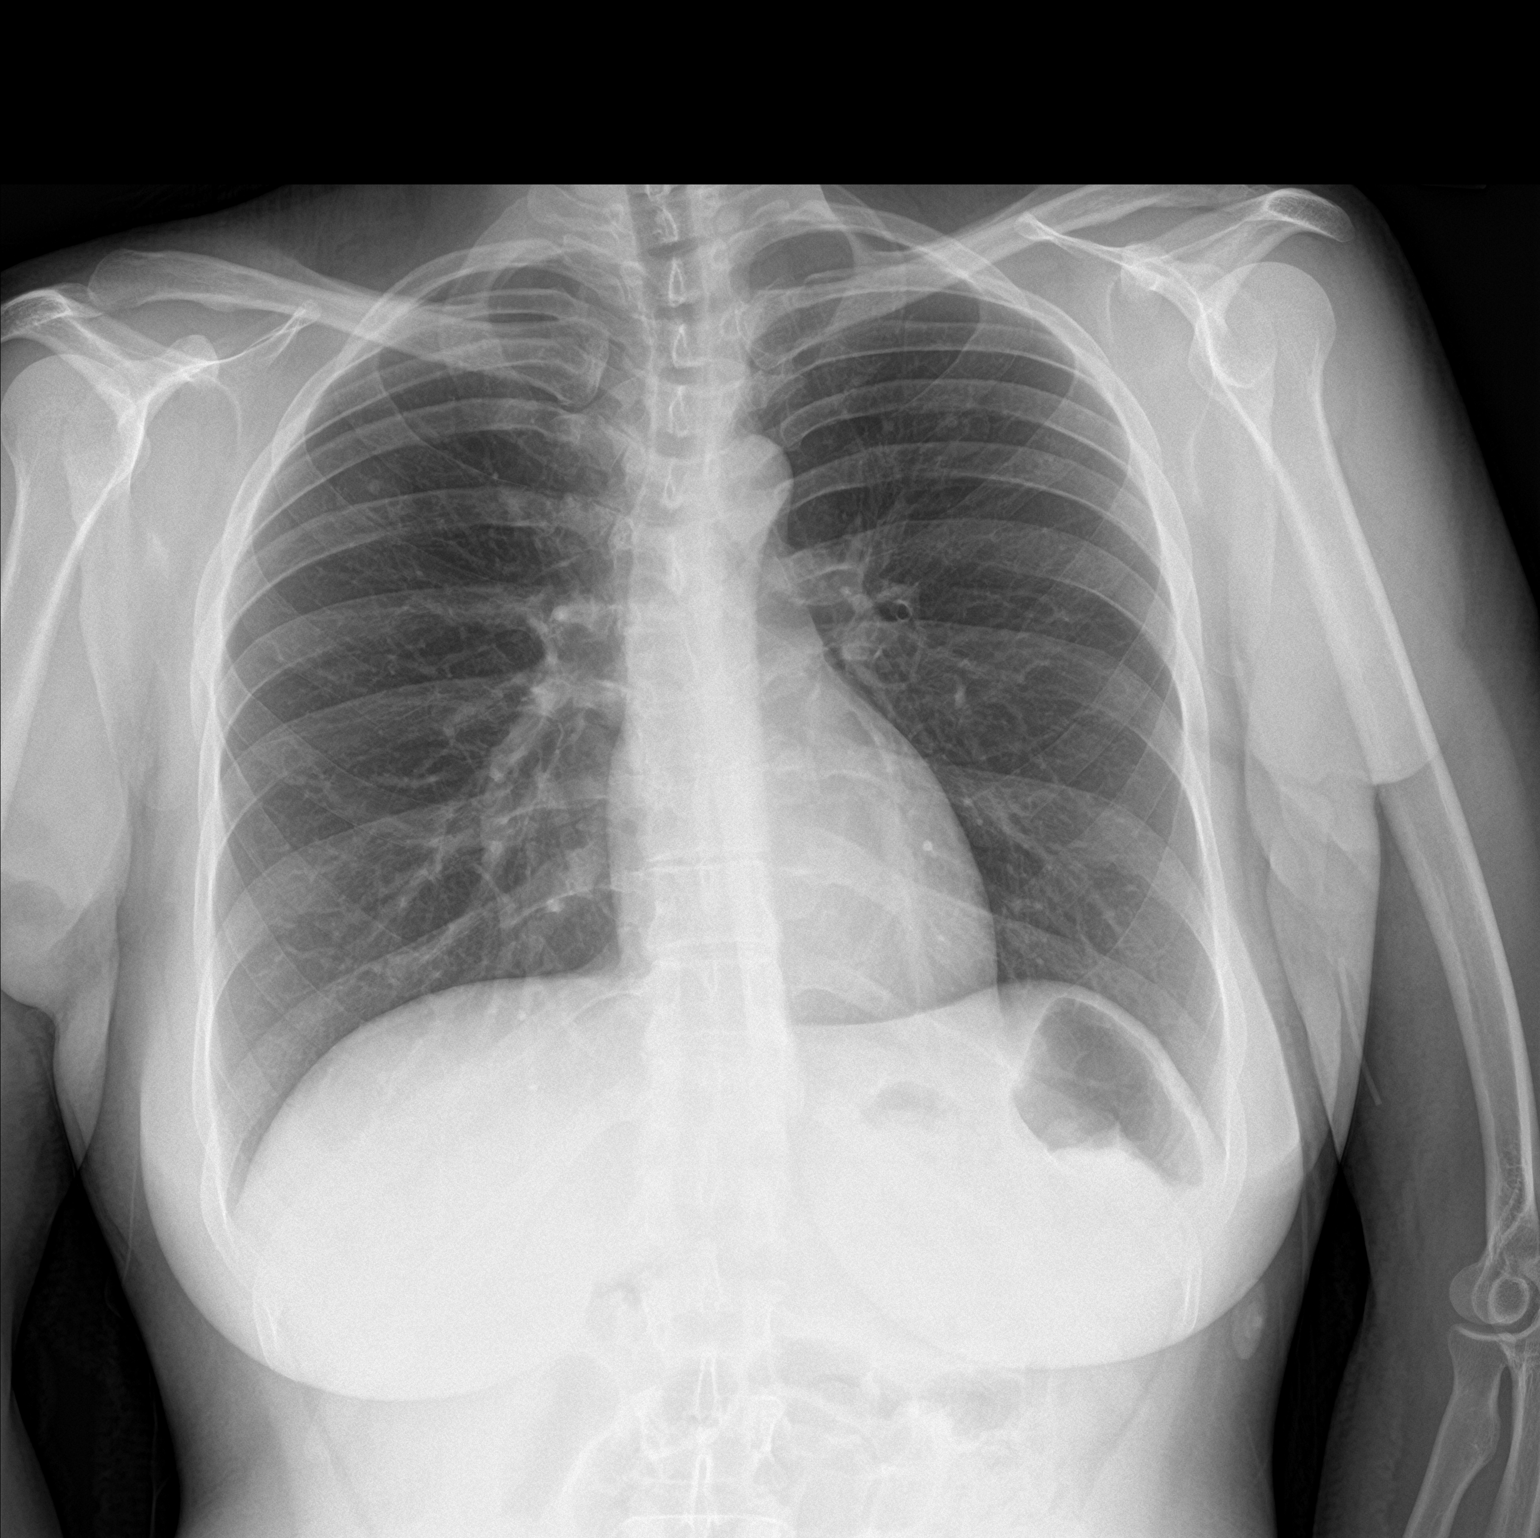

[chest lat]
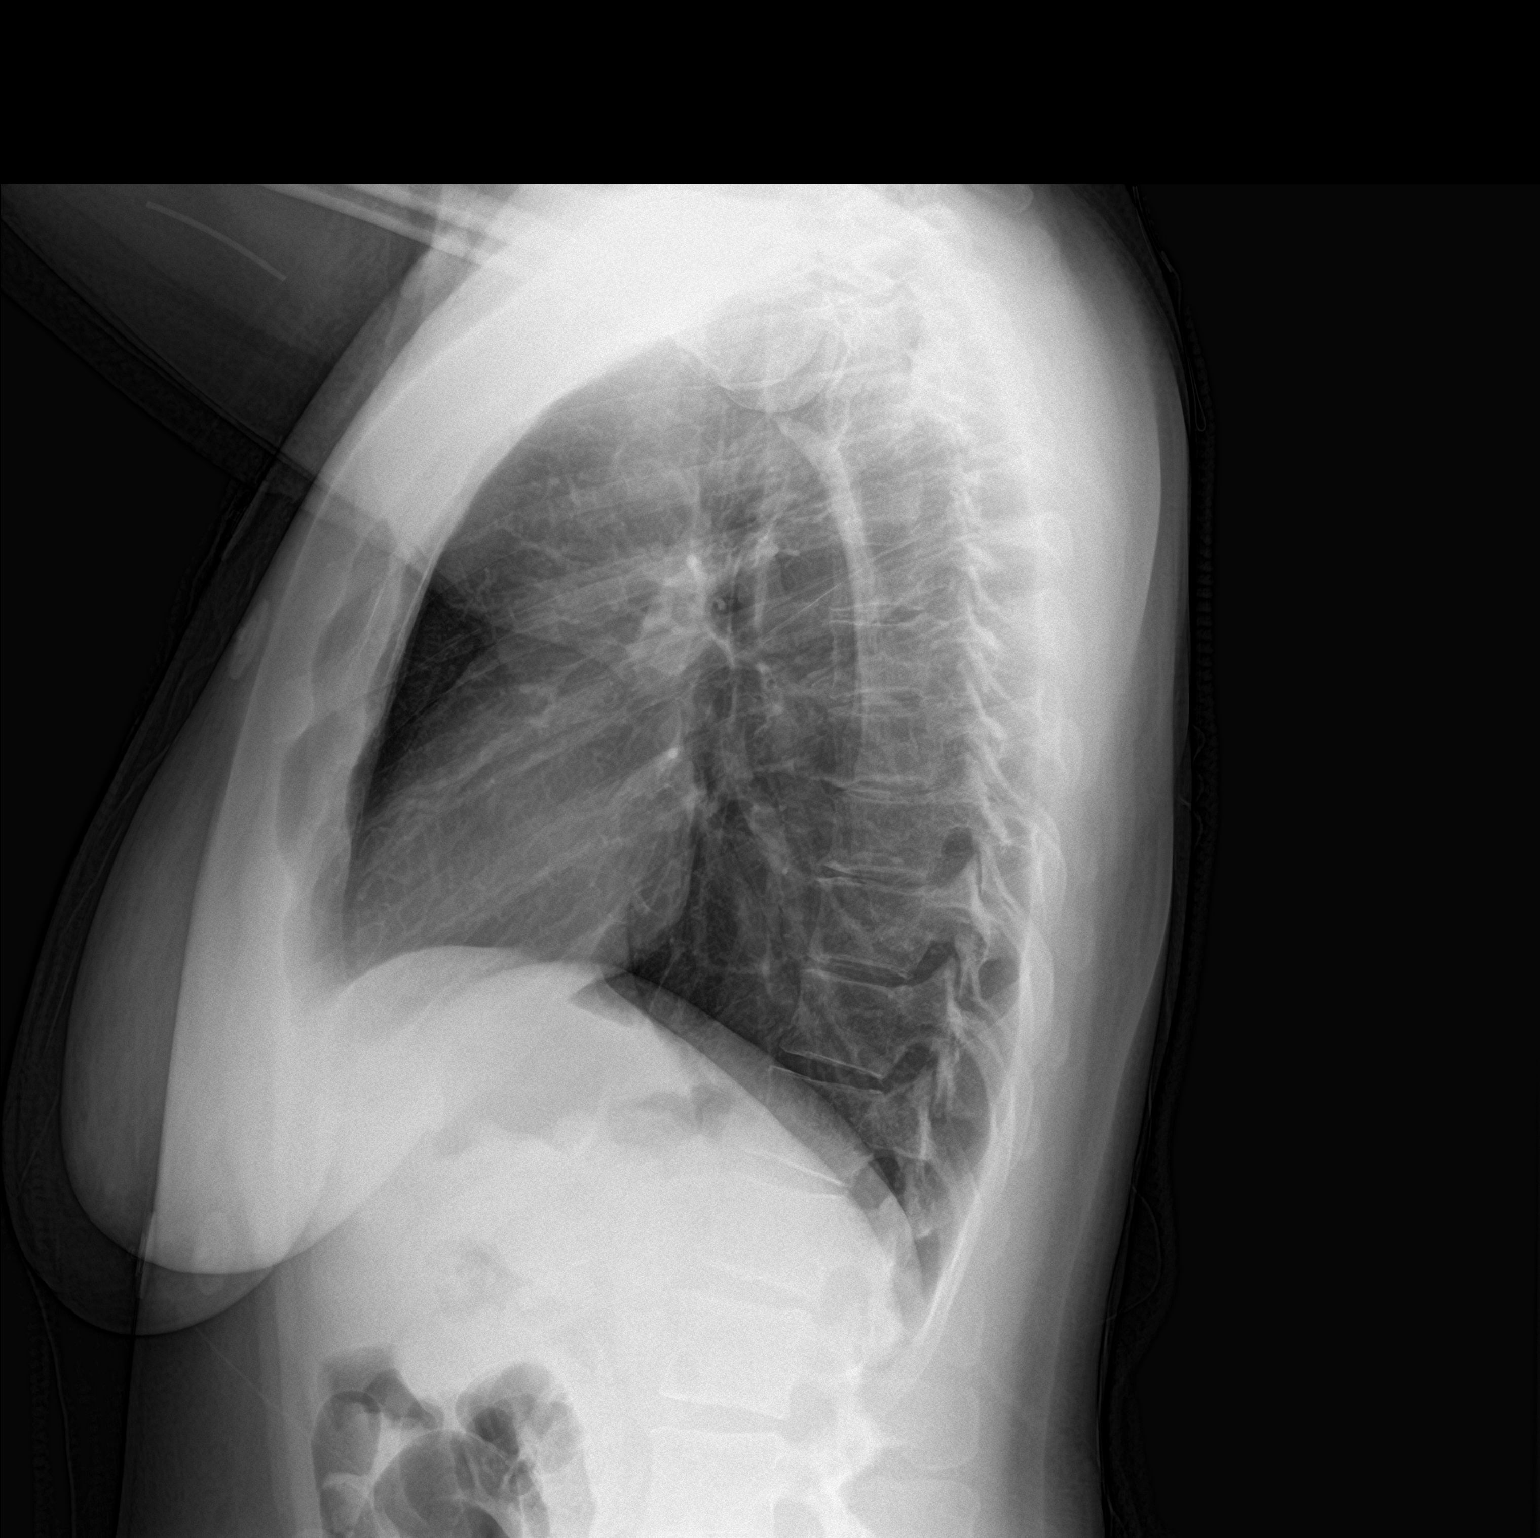

[2 of 2 positions shown; findings below may reference images not displayed]

FINDINGS: The heart size and mediastinal contours are within normal limits.
Both lungs are clear. The visualized skeletal structures are
unremarkable.
IMPRESSION: Negative

## 2021-06-26 ENCOUNTER — Ambulatory Visit (HOSPITAL_COMMUNITY): Payer: Medicaid Other

## 2021-06-28 ENCOUNTER — Ambulatory Visit (HOSPITAL_COMMUNITY)
Admission: RE | Admit: 2021-06-28 | Discharge: 2021-06-28 | Disposition: A | Discharge status: Home / Self Care | Payer: Medicaid Other | Source: Ambulatory Visit | Attending: Interventional Radiology | Admitting: Interventional Radiology

## 2021-06-28 ENCOUNTER — Other Ambulatory Visit: Payer: Self-pay | Admitting: Interventional Radiology

## 2021-06-28 DIAGNOSIS — I871 Compression of vein: Secondary | ICD-10-CM | POA: Insufficient documentation

## 2021-06-28 DIAGNOSIS — N9489 Other specified conditions associated with female genital organs and menstrual cycle: Secondary | ICD-10-CM

## 2021-06-28 MED ORDER — SODIUM CHLORIDE (PF) 0.9 % IJ SOLN
INTRAMUSCULAR | Status: AC
  Filled 2021-06-28: qty 50

## 2021-06-28 MED ORDER — IOHEXOL 300 MG/ML  SOLN
100.0000 mL | Freq: Once | INTRAMUSCULAR | Status: AC | PRN
  Administered 2021-06-28: 100 mL via INTRAVENOUS

## 2021-06-30 DIAGNOSIS — R079 Chest pain, unspecified: Secondary | ICD-10-CM | POA: Diagnosis not present

## 2021-06-30 DIAGNOSIS — I871 Compression of vein: Secondary | ICD-10-CM | POA: Diagnosis not present

## 2021-06-30 DIAGNOSIS — E039 Hypothyroidism, unspecified: Secondary | ICD-10-CM | POA: Diagnosis not present

## 2021-06-30 DIAGNOSIS — I739 Peripheral vascular disease, unspecified: Secondary | ICD-10-CM | POA: Diagnosis not present

## 2021-06-30 DIAGNOSIS — M4722 Other spondylosis with radiculopathy, cervical region: Secondary | ICD-10-CM | POA: Diagnosis not present

## 2021-06-30 DIAGNOSIS — D72829 Elevated white blood cell count, unspecified: Secondary | ICD-10-CM | POA: Diagnosis not present

## 2021-06-30 DIAGNOSIS — M4152 Other secondary scoliosis, cervical region: Secondary | ICD-10-CM | POA: Diagnosis not present

## 2021-06-30 DIAGNOSIS — G43909 Migraine, unspecified, not intractable, without status migrainosus: Secondary | ICD-10-CM | POA: Diagnosis not present

## 2021-06-30 DIAGNOSIS — G935 Compression of brain: Secondary | ICD-10-CM | POA: Diagnosis not present

## 2021-06-30 DIAGNOSIS — Z86718 Personal history of other venous thrombosis and embolism: Secondary | ICD-10-CM | POA: Diagnosis not present

## 2021-06-30 DIAGNOSIS — E876 Hypokalemia: Secondary | ICD-10-CM | POA: Diagnosis not present

## 2021-06-30 DIAGNOSIS — Z79899 Other long term (current) drug therapy: Secondary | ICD-10-CM | POA: Diagnosis not present

## 2021-06-30 DIAGNOSIS — F418 Other specified anxiety disorders: Secondary | ICD-10-CM | POA: Diagnosis not present

## 2021-06-30 DIAGNOSIS — M47816 Spondylosis without myelopathy or radiculopathy, lumbar region: Secondary | ICD-10-CM | POA: Diagnosis not present

## 2021-06-30 DIAGNOSIS — M47812 Spondylosis without myelopathy or radiculopathy, cervical region: Secondary | ICD-10-CM | POA: Diagnosis not present

## 2021-06-30 DIAGNOSIS — F419 Anxiety disorder, unspecified: Secondary | ICD-10-CM | POA: Diagnosis not present

## 2021-06-30 DIAGNOSIS — F32A Depression, unspecified: Secondary | ICD-10-CM | POA: Diagnosis not present

## 2021-06-30 DIAGNOSIS — H9311 Tinnitus, right ear: Secondary | ICD-10-CM | POA: Diagnosis not present

## 2021-06-30 HISTORY — PX: CRANIECTOMY: SHX331

## 2021-07-02 DIAGNOSIS — M47812 Spondylosis without myelopathy or radiculopathy, cervical region: Secondary | ICD-10-CM | POA: Diagnosis not present

## 2021-07-02 DIAGNOSIS — Z86718 Personal history of other venous thrombosis and embolism: Secondary | ICD-10-CM | POA: Diagnosis not present

## 2021-07-02 DIAGNOSIS — G43909 Migraine, unspecified, not intractable, without status migrainosus: Secondary | ICD-10-CM | POA: Diagnosis not present

## 2021-07-02 DIAGNOSIS — M47816 Spondylosis without myelopathy or radiculopathy, lumbar region: Secondary | ICD-10-CM | POA: Diagnosis not present

## 2021-07-02 DIAGNOSIS — G935 Compression of brain: Secondary | ICD-10-CM | POA: Diagnosis not present

## 2021-07-02 DIAGNOSIS — F418 Other specified anxiety disorders: Secondary | ICD-10-CM | POA: Diagnosis not present

## 2021-07-02 DIAGNOSIS — E876 Hypokalemia: Secondary | ICD-10-CM | POA: Diagnosis not present

## 2021-07-02 DIAGNOSIS — R079 Chest pain, unspecified: Secondary | ICD-10-CM | POA: Diagnosis not present

## 2021-07-02 DIAGNOSIS — I739 Peripheral vascular disease, unspecified: Secondary | ICD-10-CM | POA: Diagnosis not present

## 2021-07-02 DIAGNOSIS — I871 Compression of vein: Secondary | ICD-10-CM | POA: Diagnosis not present

## 2021-07-02 DIAGNOSIS — M4152 Other secondary scoliosis, cervical region: Secondary | ICD-10-CM | POA: Diagnosis not present

## 2021-07-05 ENCOUNTER — Telehealth: Payer: Self-pay | Admitting: *Deleted

## 2021-07-05 NOTE — Telephone Encounter (Signed)
Transition Care Management Follow-up Telephone Call Date of discharge and from where: 6/26 FROM [facility] How have you been since you were released from the hospital? A LOT OF PAIN AND PRESSURE IN HEAD Any questions or concerns? Yes REACHED OUT TO PROVIDER AND WAS ORDERED MEDROL DOSE PACK, INSTRUCTED TO GO TO HOSPITAL FOR TEMP>100.5 AND IF NOT FEELING ANY BETTER BY TOMORROW TO CALL OFFICE FOR CLINIC APPOINTMENT  Items Reviewed: Did the pt receive and understand the discharge instructions provided? Yes  Medications obtained and verified? Yes  Other? No  Any new allergies since your discharge? No  Dietary orders reviewed? Yes Do you have support at home? Yes   Home Care and Equipment/Supplies: Were home health services ordered? no If so, what is the name of the agency? NA  Has the agency set up a time to come to the patient's home? not applicable Were any new equipment or medical supplies ordered?  No What is the name of the medical supply agency? NA Were you able to get the supplies/equipment? not applicable Do you have any questions related to the use of the equipment or supplies? No  ASKING FOR WALKER OR CANE FOR WHEN HUSBAND RETURNS TO WORK.  STATES GAIT WAS UNSTEADY PRIOR TO SURGERY.  ENCOURAGED TO CONTACT SURGEON AND REQUESTING PRESCRIPTION,  Functional Questionnaire: (I = Independent and D = Dependent) ADLs: ASSIST  Bathing/Dressing- ASSIST  Meal Prep- D  Eating- I  Maintaining continence- I  Transferring/Ambulation- ASSIST  Managing Meds- I  Follow up appointments reviewed:  PCP Hospital f/u appt confirmed? No  ENCOURAGED TO SCHEDULE POST HOSP F/U Specialist Hospital f/u appt confirmed? Yes  Scheduled to see DR. Samson Frederic OR RACHEL on 7/7 @ 1300. Are transportation arrangements needed? No  If their condition worsens, is the pt aware to call PCP or go to the Emergency Dept.? Yes Was the patient provided with contact information for the PCP's  office or ED? Yes Was to pt encouraged to call back with questions or concerns? Yes    Rhae Lerner RN, MSN RN Care Management Coordinator  [phone] [email or web address]

## 2021-07-07 ENCOUNTER — Encounter: Payer: Self-pay | Admitting: Medical-Surgical

## 2021-07-07 ENCOUNTER — Other Ambulatory Visit: Payer: Self-pay | Admitting: Medical-Surgical

## 2021-07-07 ENCOUNTER — Other Ambulatory Visit: Payer: Self-pay | Admitting: Gastroenterology

## 2021-07-07 DIAGNOSIS — R110 Nausea: Secondary | ICD-10-CM

## 2021-07-10 NOTE — Telephone Encounter (Signed)
Joy's next open slot is 08/09/21. Is it okay to use a same day for this issue? Or schedule in August?

## 2021-07-10 NOTE — Telephone Encounter (Signed)
Scheduled patient on 07/14/21. AMUCK

## 2021-07-10 NOTE — Telephone Encounter (Signed)
Okay for same day appt

## 2021-07-14 ENCOUNTER — Encounter: Payer: Self-pay | Admitting: Medical-Surgical

## 2021-07-14 ENCOUNTER — Ambulatory Visit: Payer: Medicaid Other | Admitting: Medical-Surgical

## 2021-07-14 VITALS — BP 123/84 | HR 93 | Resp 20 | Ht 61.5 in | Wt 160.1 lb

## 2021-07-14 DIAGNOSIS — F3181 Bipolar II disorder: Secondary | ICD-10-CM | POA: Insufficient documentation

## 2021-07-14 DIAGNOSIS — Z4802 Encounter for removal of sutures: Secondary | ICD-10-CM | POA: Diagnosis not present

## 2021-07-14 DIAGNOSIS — Z9889 Other specified postprocedural states: Secondary | ICD-10-CM | POA: Diagnosis not present

## 2021-07-14 DIAGNOSIS — R490 Dysphonia: Secondary | ICD-10-CM | POA: Diagnosis not present

## 2021-07-14 MED ORDER — LAMOTRIGINE 25 MG PO TABS: ORAL_TABLET | ORAL | 0 refills | Status: DC

## 2021-07-14 NOTE — Progress Notes (Signed)
Established Patient Office Visit  Subjective   Patient ID: Yesenia Harper, female   DOB: 1985/08/20 Age: 36 y.o. MRN: [id number]   Chief Complaint  Patient presents with   Anxiety    HPI Pleasant 36 year old female presenting today for concerns with anxiety/depression.  She recently had a Chiari decompression and has been struggling with recovery and associated pains.  Per her MyChart message sent on 6/30, she has been experiencing the following "Constant crying, nervous, my mind is constantly racing. I had the duraplasty surgery. I seem to stay in constant fear and extreme stress".  In that same message, she requested restarting Xanax to manage her symptoms.  She is currently taking Viibryd 40 mg daily, tolerating well without side effects.  She was using Seroquel but didn't like the side effects.  Of note, in the postop period she has been taking Norco as well as Valium 5 mg every 6 hours as needed.  Her most recent refill of Valium was prescribed on 07/12/2021. On review of older records, she was evaluated by Psychiatry in 2018 and diagnosed with Bipolar type 2 disorder. She did not follow up with psychiatry but did see her PCP a few months later. At that point, she was referred back to psychiatry for medication management but there are no records reflecting follow up. Today, she reports that she continues to experience the above symptoms. She has been taking the Valium but notes it doesn't feel like it's working like the Xanax does.      07/14/2021    1:05 PM 06/01/2021   11:54 AM 05/02/2021    3:57 PM 05/02/2021    3:28 PM 02/28/2021   11:41 AM  Depression screen PHQ 2/9  Decreased Interest 2  2 2 3   Down, Depressed, Hopeless 3  3 3 3   PHQ - 2 Score 5  5 5 6   Altered sleeping 3  3  3   Tired, decreased energy 3  3  3   Change in appetite 3 1 3  3   Feeling bad or failure about yourself  3  3  3   Trouble concentrating 3 1 3  3   Moving slowly or fidgety/restless 3  1  3   Suicidal thoughts 0   0  0  PHQ-9 Score 23  21  24   Difficult doing work/chores Extremely dIfficult  Very difficult  Extremely dIfficult      07/14/2021    1:05 PM 05/02/2021    3:57 PM 08/03/2019    2:33 PM  GAD 7 : Generalized Anxiety Score  Nervous, Anxious, on Edge 3 2 1   Control/stop worrying 3 2 1   Worry too much - different things 3 2 1   Trouble relaxing 3 2 1   Restless 2 1 1   Easily annoyed or irritable 3 2 1   Afraid - awful might happen 2 1 0  Total GAD 7 Score 19 12 6   Anxiety Difficulty Extremely difficult Very difficult Somewhat difficult    Objective:    Vitals:   07/14/21 1128  BP: 123/84  Pulse: 93  Resp: 20  Height: 5' 1.5" (1.562 m)  Weight: 160 lb 1.9 oz (72.6 kg)  SpO2: 99%  BMI (Calculated): 29.77    Physical Exam Vitals and nursing note reviewed.  Constitutional:      General: She is not in acute distress.    Appearance: Normal appearance. She is not ill-appearing.  HENT:     Head: Normocephalic and atraumatic.  Cardiovascular:     Rate and  Rhythm: Normal rate and regular rhythm.     Pulses: Normal pulses.     Heart sounds: Normal heart sounds.  Pulmonary:     Effort: Pulmonary effort is normal. No respiratory distress.     Breath sounds: Normal breath sounds. No wheezing, rhonchi or rales.  Skin:    General: Skin is warm and dry.  Neurological:     Mental Status: She is alert and oriented to person, place, and time.  Psychiatric:        Attention and Perception: Attention normal.        Mood and Affect: Mood is anxious. Affect is labile.        Behavior: Behavior normal.        Thought Content: Thought content normal.        Cognition and Memory: Cognition normal.        Judgment: Judgment normal.   No results found for this or any previous visit (from the past 24 hour(s)).     The ASCVD Risk score (Arnett DK, et al., 2019) failed to calculate for the following reasons:   The 2019 ASCVD risk score is only valid for ages 80 to 81   Assessment & Plan:    1. Bipolar II disorder [facility]) Referring to psychiatry for medication management. Continue Viibryd 40mg  daily. Start Lamictal with up taper to goal of 100mg  daily  - Ambulatory referral to Psychiatry - lamoTRIgine (LAMICTAL) 25 MG tablet; Take 1 tablet (25 mg total) by mouth daily for 7 days, THEN 2 tablets (50 mg total) daily for 7 days, THEN 4 tablets (100 mg total) daily for 23 days.  Dispense: 113 tablet; Refill: 0  2. Hoarseness Likely in relation to recent full intubation with neurosurgery but possible LPR contributing. Recommend adding Pepcid 20mg  twice daily as needed. Avoid eating/drinking within 2 hours of bedtime. If no improvement by 4-6 weeks postop, may need further evaluation with ENT.   Return in about 3 weeks (around 08/04/2021) for mood follow up.  ___________________________________________ , DNP, APRN, FNP-BC Primary Care and Sports Medicine [facility] [place]

## 2021-07-18 ENCOUNTER — Ambulatory Visit
Admission: RE | Admit: 2021-07-18 | Discharge: 2021-07-18 | Disposition: A | Discharge status: Home / Self Care | Payer: Medicaid Other | Source: Ambulatory Visit | Attending: Interventional Radiology | Admitting: Interventional Radiology

## 2021-07-18 DIAGNOSIS — I871 Compression of vein: Secondary | ICD-10-CM

## 2021-07-18 DIAGNOSIS — I872 Venous insufficiency (chronic) (peripheral): Secondary | ICD-10-CM

## 2021-07-18 DIAGNOSIS — N9489 Other specified conditions associated with female genital organs and menstrual cycle: Secondary | ICD-10-CM

## 2021-07-18 DIAGNOSIS — R600 Localized edema: Secondary | ICD-10-CM | POA: Diagnosis not present

## 2021-07-18 NOTE — Progress Notes (Signed)
Chief Complaint: Patient was seen in consultation today for lower extremity edema at the request of [name]  Referring Physician(s): [name]  History of Present Illness: Yesenia Harper is a 36 y.o. female who I first saw back in August 2022.  At that time, she was referred to me by Dr. [name] to assess for possible pelvic congestion syndrome due to a constellation of symptoms including left calf pain, left foot and ankle swelling, left back and flank pain and left lower abdominal pain, heaviness, aching and bloating.  I assessed her and felt she had a high probability of pelvic congestion syndrome with profound reflux in the left ovarian vein.  We felt her left lower extremity symptoms were likely related although there was some mild superficial venous insufficiency as well.  Additionally, as a female of her age, [name] was a consideration.  She did have a history of DVT diagnosed in 2006.  Unfortunately, her insurance would not approve treatment for pelvic congestion syndrome.  Ultimately, she changed jobs and ensures and sought a second opinion at [facility].  Dr. Margaretha Glassing was able to see her and diagnosed her with both pelvic congestion syndrome and [name].  She underwent embolization of the left ovarian vein as well as stenting of the left common and external iliac veins in November 2022.  She felt great following the procedure for approximately 4-6 weeks before developing acute onset of severe pain while she was out of town.  She was assessed at an out-of-town emergency room and told that she had nonocclusive DVT in the common femoral vein.  Unfortunately, she left AMA as [place]  She was not treated with anticoagulation at that time.  Following this she began to develop recurrent left lower extremity edema, heaviness and color changes in her left foot.  She followed up with Dr.  [name] who felt that her interventional therapy was maximized and he had nothing further to offer her.  In April, she had a second left lower extremity DVT which was negative for any evidence of deep venous thrombosis or superficial venous thrombosis.  She was recently seen by hematology to evaluate for a clotting disorder.  She has been evaluated [facility] for potential heart related issues.  Additionally, she has a Chiari malformation and recently underwent neurosurgical intervention.   She came into the office today for a lower extremity venous insufficiency evaluation and follow-up visit for a second opinion.   Her significant other is with her.  Unfortunately, her symptoms remain problematic.  She notes that her cardiology work-up has revealed 2 issues that require further investigation.  She has a follow-up appointment next month.  She is recovering well from her recent neurosurgical intervention.  Evaluation of her superficial venous system today was negative.  No evidence of venous insufficiency.  Similarly, I carefully reviewed her CT venogram and her venous stents are widely patent.  She is not taking gabapentin or another similar medicine that could result in lower extremity edema.    Past Medical History:  Diagnosis Date   Anxiety    Anxiety with depression 08/03/2019   Bilateral carpal tunnel syndrome 02/23/2011   Cervical radiculitis 09/17/2013   Chiari malformation type I (HCC) 04/28/2013   Cognitive impairment, mild, so stated 05/23/2014   Degenerative joint disease of cervical and lumbar spine 05/23/2014   Delivery of pregnancy  by cesarean section    Depression    Dry cough 08/03/2019   Epidermal inclusion cyst 04/10/2012   Eye pain 04/24/2013   Fever of unknown origin (FUO) 11/05/2014   Fluid retention in legs 06/06/2021   Gastrointestinal symptoms 06/06/2021   Formatting of this note might be different from the original.     Last  Assessment & Plan:  Formatting of this note might be different from the original.   Hematuria 12/18/2019   History of C-section 05/12/2014   Formatting of this note might be different from the original. 2006 LTCS for breech & NRFHR   History of DVT (deep vein thrombosis) 07/05/2020   Hypothyroidism    Lumbar radiculopathy 09/17/2013   May-Thurner syndrome 12/13/2020   MDD (major depressive disorder), recurrent episode, mild (HCC) 04/04/2012   Migraine headache without aura 04/10/2012   Myopia of both eyes 04/24/2013   Neck pain 09/17/2013   Neuropathy, median nerve 09/17/2013   Pain in joint involving left lower leg 07/05/2020   Pain of left lower extremity 07/05/2020   Scoliosis 12/18/2019   Visual disturbance 04/24/2013    Past Surgical History:  Procedure Laterality Date   CESAREAN SECTION     IR RADIOLOGIST EVAL & MGMT  08/23/2020   IR RADIOLOGIST EVAL & MGMT  05/30/2021    Allergies: Penicillins  Medications: Prior to Admission medications   Medication Sig Start Date End Date Taking? Authorizing Provider  etonogestrel (NEXPLANON) 68 MG IMPL implant Inject 1 each into the skin once.    [provider]  hydrochlorothiazide (HYDRODIURIL) 25 MG tablet Take 1 tablet (25 mg total) by mouth daily. 02/28/21   Christen ButterJessup, Joy, NP  lamoTRIgine (LAMICTAL) 25 MG tablet Take 1 tablet (25 mg total) by mouth daily for 7 days, THEN 2 tablets (50 mg total) daily for 7 days, THEN 4 tablets (100 mg total) daily for 23 days. 07/14/21 08/20/21  Christen ButterJessup, Joy, NP  levothyroxine (SYNTHROID) 75 MCG tablet TAKE ONE TABLET BY MOUTH DAILY ON MONDAYS, TUESDAYS, WEDNESDAYS, THURSDAYS AND FRIDAYS. TAKE ONE AND ONE-HALF TABLET ON SATURDAYS AND SUNDAYS. 06/13/21   Christen ButterJessup, Joy, NP  linaclotide (LINZESS) 290 MCG CAPS capsule Take 290 mcg by mouth daily.    [provider]  Multiple Vitamin (MULTIVITAMIN ADULT PO) Take 1 tablet by mouth daily.    [provider]  ondansetron (ZOFRAN-ODT) 8 MG disintegrating  tablet TAKE 1 TABLET BY MOUTH EVERY 8 HOURS AS NEEDED FOR NAUSEA 07/07/21   Cirigliano, Vito V, DO  potassium chloride SA (KLOR-CON M) 20 MEQ tablet Take 40 mEq by mouth 2 (two) times daily. 03/23/21   [provider]  QUEtiapine (SEROQUEL) 25 MG tablet Take 1 tablet (25 mg total) by mouth at bedtime. 06/01/21   Christen ButterJessup, Joy, NP  spironolactone (ALDACTONE) 25 MG tablet Take 25 mg by mouth daily. 04/17/21   [provider]  topiramate (TOPAMAX) 200 MG tablet Take 200 mg by mouth 2 (two) times daily. 07/21/19   [provider]  Vilazodone HCl (VIIBRYD) 40 MG TABS Take 1 tablet (40 mg total) by mouth daily. 05/02/21   Christen ButterJessup, Joy, NP     Family History  Problem Relation Age of Onset   Hypertension Mother    Diabetes Mother    Colon polyps Mother    Hypertension Father    Diabetes Father    Colon polyps Father    Diabetes Maternal Grandmother    Hypertension Maternal Grandmother    Stroke Maternal Grandmother  Diabetes Maternal Grandfather    Hypertension Maternal Grandfather    Stroke Maternal Grandfather    Diabetes Paternal Grandmother    Hypertension Paternal Grandmother    Stroke Paternal Grandmother    Diabetes Paternal Grandfather    Hypertension Paternal Grandfather    Stroke Paternal Grandfather    Colon cancer Neg Hx    Pancreatic cancer Neg Hx    Stomach cancer Neg Hx    Esophageal cancer Neg Hx    Liver cancer Neg Hx     Social History   Socioeconomic History   Marital status: Married    Spouse name: Not on file   Number of children: 2   Years of education: Not on file   Highest education level: Not on file  Occupational History   Not on file  Tobacco Use   Smoking status: Never   Smokeless tobacco: Never  Vaping Use   Vaping Use: Never used  Substance and Sexual Activity   Alcohol use: Not Currently   Drug use: Never   Sexual activity: Yes    Partners: Male    Birth control/protection: Implant  Other Topics Concern   Not on file   Social History Narrative   Not on file   Social Determinants of Health   Financial Resource Strain: Not on file  Food Insecurity: Not on file  Transportation Needs: Not on file  Physical Activity: Not on file  Stress: Not on file  Social Connections: Not on file    Review of Systems: A 12 point ROS discussed and pertinent positives are indicated in the HPI above.  All other systems are negative.  Review of Systems  Vital Signs: There were no vitals taken for this visit.    Physical Exam Constitutional:      General: She is not in acute distress.    Appearance: Normal appearance.  HENT:     Head: Normocephalic and atraumatic.  Eyes:     General: No scleral icterus. Cardiovascular:     Rate and Rhythm: Normal rate.  Pulmonary:     Effort: Pulmonary effort is normal.  Skin:    General: Skin is warm and dry.  Neurological:     Mental Status: She is alert and oriented to person, place, and time.  Psychiatric:        Mood and Affect: Mood normal.        Behavior: Behavior normal.       Imaging: [place] RAD EVAL AND MGMT  Result Date: 07/18/2021 Please refer to "Notes" to see consult details.  CT VENOGRAM ABD/PEL  Addendum Date: 07/18/2021   ADDENDUM REPORT: 07/18/2021 11:07 ADDENDUM: Addendum created after review of the images. The stent system of the left common iliac vein and external iliac vein is patent, as previously indicated. At the overlap site of these 2 stents, there is some irregularity of the stent tines at the interface. This is of uncertain significance, however, the marginal tines of either the proximal or distal stent appears to be directed inferior-medially towards the underlying left adnexa. The stent system appears patent, without definite evidence of a flow limiting stenosis. No evidence of intra-stent DVT. Electronically Signed   By: Gilmer Mor D.O.   On: 07/18/2021 11:07   Result Date: 07/18/2021 CLINICAL DATA:  36 year old female with a history  of May-Thurner, pelvic congestion syndrome, previous treatment and recurrent symptoms EXAM: CT VENOGRAM ABDOMEN AND PELVIS TECHNIQUE: Axial spiral CT images were performed through the abdomen pelvis after administration of standard  IV contrast bolus, with imaging acquired with attention to the venous system. RADIATION DOSE REDUCTION: This exam was performed according to the departmental dose-optimization program which includes automated exposure control, adjustment of the mA and/or kV according to patient size and/or use of iterative reconstruction technique. CONTRAST:  OMNIPAQUE IOHEXOL 300 MG/ML  SOLN COMPARISON:  08/19/2020, 08/16/2020, 08/24/2019 FINDINGS: VASCULAR Arterial: Timing of the contrast bolus somewhat limits evaluation, however, there is no significant atherosclerosis. No aortic aneurysm or dissection. No wall thickening or periaortic fluid. Mesenteric arteries and renal arteries are patent. Bilateral iliac arteries and proximal femoral arteries are patent. Venous: Portal: Unremarkable splenic vein, main portal vein, left and right portal veins. Superior mesenteric vein and inferior mesenteric vein are patent. Middle, right, left hepatic veins patent. IVC unremarkable. Bilateral renal veins unremarkable. Patent stent system of the left iliac system, which extends through common iliac artery, external iliac artery. No evidence of in stent stenosis. Unremarkable right common iliac vein and right external iliac vein. Interval embolization of the left gonadal vein. No enlarged body wall collaterals.  No enlarged pelvic collaterals. Bilateral common femoral veins unremarkable. NONVASCULAR Low chest: No acute finding Hepatic biliary: Unremarkable liver parenchyma. Unremarkable gallbladder. Pancreas: Unremarkable Spleen: Unremarkable Adrenal/kidneys: - Right adrenal gland:  Unremarkable - Left adrenal gland: Unremarkable. - Right kidney: No hydronephrosis, nephrolithiasis, inflammation, or ureteral  dilation. No focal lesion. - Left Kidney: No hydronephrosis, nephrolithiasis, inflammation, or ureteral dilation. No focal lesion. - Urinary Bladder: Unremarkable. Gastrointestinal: - Stomach: Unremarkable. - Small bowel: Unremarkable - Appendix: Normal. - Colon: Unremarkable. Mesenteric: No free air or free fluid. Lymphatic: No lymphadenopathy Reproductive: Unremarkable uterus/adnexa Other: No abdominal wall hernia. Musculoskeletal: No acute displaced fracture. No bony canal narrowing. IMPRESSION: Patent left iliac venous stent system, with no evidence of in stent stenosis. Interval embolization of the left gonadal vein. No acute abdominal/pelvic finding to account for patient's flank pain. Signed, Yvone Neu. Miachel Roux, RPVI [facility] Electronically Signed: By: Gilmer Mor D.O. On: 06/28/2021 14:55    Labs:  CBC: Recent Labs    12/13/20 0000 04/21/21 1839 05/12/21 0000 05/29/21 1119  WBC 7.2 8.4 7.0 12.1*  HGB 12.5 13.0 13.1 14.4  HCT 36.7 38.1 39.1 43.3  PLT 342 315 329 398    COAGS: Recent Labs    12/29/20 0939 05/12/21 0000  INR 1.0 1.0  APTT  --  30    BMP: Recent Labs    08/19/20 1245 12/13/20 0000 02/13/21 1136 04/21/21 1839 05/12/21 0000 05/29/21 1119  NA 138 139  --  141 140 137  K 3.4* 3.9  --  3.2* 3.6 3.6  CL 109 108  --  110 107 107  CO2 23 23  --  25 24 22   GLUCOSE 80 74  --  98 76 95  BUN 10 17  --  19 16 16   CALCIUM 9.1 8.9  --  8.9 8.9 10.1  CREATININE 0.88 0.81 0.80 0.93 0.78 0.76  GFRNONAA >60  --   --  >60  --  >60    LIVER FUNCTION TESTS: Recent Labs    08/19/20 1245 12/13/20 0000 12/29/20 0939 02/08/21 1622 05/12/21 0000 05/29/21 1119  BILITOT 0.5   < > 0.3 0.3 0.3 0.4  AST 16   < > 32* 11 12 12*  ALT 24   < > 56* 11 14 26   ALKPHOS 45  --   --  57  --  57  PROT  6.8   < > 6.8 7.1 6.6 8.4*  ALBUMIN 4.2  --   --  4.5  --  5.2*   < > = values in this interval not  displayed.    TUMOR MARKERS: No results for input(s): "AFPTM", "CEA", "CA199", "CHROMGRNA" in the last 8760 hours.  Assessment and Plan:  Pleasant but unfortunate 36 year old female with persistent symptoms of bilateral lower extremity edema, fatigue and tiredness despite endovascular correction of [name] and coil embolization of the incompetent left gonadal vein.  I carefully reviewed her CT images.  Her left iliac stent system is widely patent.  There is a small issue at the overlap of the venous stents but this does not appear to be limiting flow, resulting in thrombosis or resulting in pelvic specific symptoms.  Further, her dedicated venous evaluation today demonstrates no evidence of superficial venous insufficiency.  Her symptoms must be due to either a primary heart issue, a and endocrine abnormality involving her renin angiotensin aldosterone system, or perhaps a primary abnormality with in the sympathetic/parasympathetic innervation of the venous system similar to positional orthostatic tachycardia syndrome.  From a structural standpoint, there are no significant venous issues that I think require additional intervention.  She has a follow-up evaluation with cardiology next month.  I also encouraged her to talk with cardiology and/or her primary care physician about going to see an endocrinologist, and possibly a specialist in POTS and related innervation syndromes.  I wish I had more to offer her, but unfortunately these disease processes are well outside of my realm of expertise.    Electronically Signed: Sterling Big 07/18/2021, 12:44 PM   I spent a total of 25 Minutes in face to face in clinical consultation, greater than 50% of which was counseling/coordinating care for lower extremity edema, heaviness and fatigue.

## 2021-07-28 DIAGNOSIS — R079 Chest pain, unspecified: Secondary | ICD-10-CM | POA: Diagnosis not present

## 2021-07-28 DIAGNOSIS — R0789 Other chest pain: Secondary | ICD-10-CM | POA: Diagnosis not present

## 2021-07-29 DIAGNOSIS — R079 Chest pain, unspecified: Secondary | ICD-10-CM | POA: Diagnosis not present

## 2021-07-31 ENCOUNTER — Encounter: Payer: Self-pay | Admitting: Medical-Surgical

## 2021-07-31 DIAGNOSIS — J029 Acute pharyngitis, unspecified: Secondary | ICD-10-CM

## 2021-07-31 DIAGNOSIS — R490 Dysphonia: Secondary | ICD-10-CM

## 2021-08-01 ENCOUNTER — Telehealth: Payer: Self-pay | Admitting: General Practice

## 2021-08-01 DIAGNOSIS — R071 Chest pain on breathing: Secondary | ICD-10-CM | POA: Diagnosis not present

## 2021-08-01 DIAGNOSIS — Z86718 Personal history of other venous thrombosis and embolism: Secondary | ICD-10-CM | POA: Diagnosis not present

## 2021-08-01 NOTE — Telephone Encounter (Signed)
Transition Care Management Follow-up Telephone Call Date of discharge and from where: 07/29/21 from [facility] How have you been since you were released from the hospital? Patient eloped ER. She has sent PCP a message regarding a referral to ENT.  Any questions or concerns? No

## 2021-08-02 ENCOUNTER — Encounter: Payer: Self-pay | Admitting: Family

## 2021-08-03 ENCOUNTER — Telehealth: Payer: Self-pay | Admitting: Cardiology

## 2021-08-03 NOTE — Telephone Encounter (Signed)
Patient sent a message through [place] requesting an appointment with you because of an ongoing cough, elevated d-dimer and shes had a cath procedure done. Should I put her on your schedule for today if I can get a hold of her? I just didn't know if this was an urgent situation or not.

## 2021-08-04 ENCOUNTER — Telehealth: Payer: Medicaid Other | Admitting: Medical-Surgical

## 2021-08-05 ENCOUNTER — Other Ambulatory Visit: Payer: Self-pay | Admitting: Medical-Surgical

## 2021-08-05 DIAGNOSIS — F3181 Bipolar II disorder: Secondary | ICD-10-CM

## 2021-08-09 DIAGNOSIS — Z95828 Presence of other vascular implants and grafts: Secondary | ICD-10-CM | POA: Diagnosis not present

## 2021-08-09 DIAGNOSIS — Z6831 Body mass index (BMI) 31.0-31.9, adult: Secondary | ICD-10-CM | POA: Diagnosis not present

## 2021-08-09 DIAGNOSIS — R0609 Other forms of dyspnea: Secondary | ICD-10-CM | POA: Diagnosis not present

## 2021-08-09 DIAGNOSIS — I208 Other forms of angina pectoris: Secondary | ICD-10-CM | POA: Diagnosis not present

## 2021-08-09 DIAGNOSIS — E669 Obesity, unspecified: Secondary | ICD-10-CM | POA: Diagnosis not present

## 2021-08-09 DIAGNOSIS — Q899 Congenital malformation, unspecified: Secondary | ICD-10-CM | POA: Diagnosis not present

## 2021-08-09 DIAGNOSIS — D720 Genetic anomalies of leukocytes: Secondary | ICD-10-CM | POA: Diagnosis not present

## 2021-08-10 DIAGNOSIS — R631 Polydipsia: Secondary | ICD-10-CM | POA: Diagnosis not present

## 2021-08-10 DIAGNOSIS — E538 Deficiency of other specified B group vitamins: Secondary | ICD-10-CM | POA: Diagnosis not present

## 2021-08-10 DIAGNOSIS — M5412 Radiculopathy, cervical region: Secondary | ICD-10-CM | POA: Diagnosis not present

## 2021-08-10 DIAGNOSIS — G629 Polyneuropathy, unspecified: Secondary | ICD-10-CM | POA: Diagnosis not present

## 2021-08-10 DIAGNOSIS — R413 Other amnesia: Secondary | ICD-10-CM | POA: Diagnosis not present

## 2021-08-10 DIAGNOSIS — G609 Hereditary and idiopathic neuropathy, unspecified: Secondary | ICD-10-CM | POA: Diagnosis not present

## 2021-08-10 DIAGNOSIS — E531 Pyridoxine deficiency: Secondary | ICD-10-CM | POA: Diagnosis not present

## 2021-08-10 DIAGNOSIS — R202 Paresthesia of skin: Secondary | ICD-10-CM | POA: Diagnosis not present

## 2021-08-10 DIAGNOSIS — G43719 Chronic migraine without aura, intractable, without status migrainosus: Secondary | ICD-10-CM | POA: Diagnosis not present

## 2021-08-10 DIAGNOSIS — E559 Vitamin D deficiency, unspecified: Secondary | ICD-10-CM | POA: Diagnosis not present

## 2021-08-10 DIAGNOSIS — M5416 Radiculopathy, lumbar region: Secondary | ICD-10-CM | POA: Diagnosis not present

## 2021-08-16 DIAGNOSIS — G935 Compression of brain: Secondary | ICD-10-CM | POA: Diagnosis not present

## 2021-08-16 DIAGNOSIS — R071 Chest pain on breathing: Secondary | ICD-10-CM | POA: Diagnosis not present

## 2021-08-16 DIAGNOSIS — R0609 Other forms of dyspnea: Secondary | ICD-10-CM | POA: Diagnosis not present

## 2021-08-16 DIAGNOSIS — I871 Compression of vein: Secondary | ICD-10-CM | POA: Diagnosis not present

## 2021-08-16 DIAGNOSIS — R0602 Shortness of breath: Secondary | ICD-10-CM | POA: Diagnosis not present

## 2021-08-17 ENCOUNTER — Encounter: Payer: Self-pay | Admitting: Gastroenterology

## 2021-08-18 ENCOUNTER — Telehealth: Payer: Self-pay | Admitting: Gastroenterology

## 2021-08-18 ENCOUNTER — Telehealth: Payer: Self-pay | Admitting: *Deleted

## 2021-08-18 DIAGNOSIS — R1011 Right upper quadrant pain: Secondary | ICD-10-CM

## 2021-08-18 NOTE — Telephone Encounter (Signed)
Patient called and states she had a CT scan at [facility] and it said something about her Celiac artery she wanted to know if she should make an appt to be seen. We have not seen this patient for this issue I advised her to talk to the Dr who ordered the CT if the Dr feels she need to be seen they can send a referral and we can get her scheduled. Patient verbalized understanding.

## 2021-08-18 NOTE — Telephone Encounter (Signed)
CTA results reviewed.  From a GI standpoint, there was colonic diverticulosis, but no diverticulitis.  Moderate amount of retained stool in the colon.  Otherwise no bowel obstruction, wall thickening, inflammation.  Mesenteric vessels are patent.  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  We can place a referral to Vascular Surgery to see if the degree of focal stenosis in the proximal celiac artery is enough to cause abdominal pain.  Otherwise, should not be related to the diarrhea and postprandial urgency.

## 2021-08-18 NOTE — Telephone Encounter (Signed)
Spoke with pt and let pt know that if she has seen a vascular office before she could just follow up with them and we do not need to place a referral. Pt stated in an aggressive tone "As I previously said I called the vein office and they told me that I needed a new referral!" Let pt know that this was not in the previous message and that I can place a new referral. Pt stated "do you even know where?" Asked pt if it was [facility] and pt stated that was correct.  Referral placed.

## 2021-08-18 NOTE — Telephone Encounter (Signed)
Inbound call from patient stating that Dr. Barron Alvine had talked about sending a referral out for her to a Vascular.  Patient stated that she has a vascular office already and that the referral could be sent to them.  [facility]  [phone]

## 2021-08-22 ENCOUNTER — Telehealth: Payer: Self-pay

## 2021-08-22 NOTE — Telephone Encounter (Signed)
Pt called to confirm receipt of referral from GI for a new issue.  Reviewed pt's chart, returned call for clarification, two identifiers used. Confirmed referral receipt, informed her she would get a call to schedule appt soon. Confirmed understanding.

## 2021-08-23 DIAGNOSIS — G3184 Mild cognitive impairment, so stated: Secondary | ICD-10-CM | POA: Diagnosis not present

## 2021-08-23 DIAGNOSIS — M545 Low back pain, unspecified: Secondary | ICD-10-CM | POA: Diagnosis not present

## 2021-08-23 DIAGNOSIS — M542 Cervicalgia: Secondary | ICD-10-CM | POA: Diagnosis not present

## 2021-08-23 DIAGNOSIS — F5101 Primary insomnia: Secondary | ICD-10-CM | POA: Diagnosis not present

## 2021-08-23 DIAGNOSIS — E559 Vitamin D deficiency, unspecified: Secondary | ICD-10-CM | POA: Diagnosis not present

## 2021-08-23 DIAGNOSIS — G43719 Chronic migraine without aura, intractable, without status migrainosus: Secondary | ICD-10-CM | POA: Diagnosis not present

## 2021-08-25 DIAGNOSIS — E559 Vitamin D deficiency, unspecified: Secondary | ICD-10-CM | POA: Diagnosis not present

## 2021-08-25 DIAGNOSIS — E039 Hypothyroidism, unspecified: Secondary | ICD-10-CM | POA: Diagnosis not present

## 2021-08-25 DIAGNOSIS — Z130 Encounter for screening for diseases of the blood and blood-forming organs and certain disorders involving the immune mechanism: Secondary | ICD-10-CM | POA: Diagnosis not present

## 2021-08-25 DIAGNOSIS — Z13220 Encounter for screening for lipoid disorders: Secondary | ICD-10-CM | POA: Diagnosis not present

## 2021-08-25 DIAGNOSIS — R0609 Other forms of dyspnea: Secondary | ICD-10-CM | POA: Diagnosis not present

## 2021-08-25 DIAGNOSIS — Z1329 Encounter for screening for other suspected endocrine disorder: Secondary | ICD-10-CM | POA: Diagnosis not present

## 2021-08-30 ENCOUNTER — Encounter: Payer: Self-pay | Admitting: Vascular Surgery

## 2021-08-30 ENCOUNTER — Ambulatory Visit (INDEPENDENT_AMBULATORY_CARE_PROVIDER_SITE_OTHER): Payer: Medicaid Other | Admitting: Vascular Surgery

## 2021-08-30 VITALS — BP 130/87 | HR 107 | Temp 98.5°F | Resp 20 | Ht 61.0 in | Wt 165.0 lb

## 2021-08-30 DIAGNOSIS — I7779 Dissection of other artery: Secondary | ICD-10-CM

## 2021-08-30 NOTE — Progress Notes (Signed)
Patient ID: Yesenia Harper, female   DOB: [birth year], 36 y.o.   MRN: [id number]  Reason for Consult: Follow-up   Referred by Christen Butter, NP  Subjective:     HPI:  Yesenia Harper is a 36 y.o. female history of abdominal pain starting in the anterior abdomen radiating around her left side.  She also more recently has back pain.  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  She states that she chronically feels bloated.  She does not have any previous history of arterial vascular surgery.  Recent underwent CT scan for abdominal pain and was also having chest pain and demonstrated abnormality of the celiac artery which was new from previous scans.  States that since this time pain in the back is caused to become worse.  She is able to eat but really has significant abdominal pain all the time from the time she wakes up in the morning until night with associated nausea.  She denies any associated weight loss.  Past Medical History:  Diagnosis Date   Anxiety    Anxiety with depression 08/03/2019   Bilateral carpal tunnel syndrome 02/23/2011   Cervical radiculitis 09/17/2013   Chiari malformation type I (HCC) 04/28/2013   Cognitive impairment, mild, so stated 05/23/2014   Degenerative joint disease of cervical and lumbar spine 05/23/2014   Delivery of pregnancy by cesarean section    Depression    Dry cough 08/03/2019   Epidermal inclusion cyst 04/10/2012   Eye pain 04/24/2013   Fever of unknown origin (FUO) 11/05/2014   Fluid retention in legs 06/06/2021   Gastrointestinal symptoms 06/06/2021   Formatting of this note might be different from the original.     Last Assessment & Plan:  Formatting of this note might be different from the original.   Hematuria 12/18/2019   History of  C-section 05/12/2014   Formatting of this note might be different from the original. 2006 LTCS for breech & NRFHR   History of DVT (deep vein thrombosis) 07/05/2020   Hypothyroidism    Lumbar radiculopathy 09/17/2013   May-Thurner syndrome 12/13/2020   MDD (major depressive disorder), recurrent episode, mild (HCC) 04/04/2012   Migraine headache without aura 04/10/2012   Myopia of both eyes 04/24/2013   Neck pain 09/17/2013   Neuropathy, median nerve 09/17/2013   Pain in joint involving left lower leg 07/05/2020   Pain of left lower extremity 07/05/2020   Scoliosis 12/18/2019   Visual disturbance 04/24/2013   Family History  Problem Relation Age of Onset   Hypertension Mother    Diabetes Mother    Colon polyps Mother    Hypertension Father    Diabetes Father    Colon polyps Father    Diabetes Maternal Grandmother    Hypertension Maternal Grandmother    Stroke Maternal Grandmother    Diabetes Maternal Grandfather    Hypertension Maternal Grandfather    Stroke Maternal Grandfather    Diabetes Paternal Grandmother    Hypertension Paternal Grandmother    Stroke Paternal Grandmother    Diabetes Paternal Grandfather    Hypertension Paternal Grandfather    Stroke Paternal Grandfather    Colon cancer Neg Hx    Pancreatic cancer Neg Hx    Stomach cancer Neg Hx    Esophageal cancer  Neg Hx    Liver cancer Neg Hx    Past Surgical History:  Procedure Laterality Date   CESAREAN SECTION     IR RADIOLOGIST EVAL & MGMT  08/23/2020   IR RADIOLOGIST EVAL & MGMT  05/30/2021    Short Social History:  Social History   Tobacco Use   Smoking status: Never   Smokeless tobacco: Never  Substance Use Topics   Alcohol use: Not Currently    Allergies  Allergen Reactions   Penicillins Itching and Rash    Patient feels itching in mouth. Patient feels itching in mouth.    Current Outpatient Medications  Medication Sig Dispense Refill   amLODipine (NORVASC) 2.5 MG tablet Take 2.5 mg by mouth 2 (two)  times daily.     buPROPion (WELLBUTRIN XL) 150 MG 24 hr tablet Take 150 mg by mouth daily.     diazepam (VALIUM) 5 MG tablet Take by mouth.     etonogestrel (NEXPLANON) 68 MG IMPL implant Inject 1 each into the skin once.     furosemide (LASIX) 20 MG tablet Take 20 mg by mouth daily.     isosorbide mononitrate (IMDUR) 30 MG 24 hr tablet Take 30 mg by mouth daily.     levothyroxine (SYNTHROID) 75 MCG tablet TAKE ONE TABLET BY MOUTH DAILY ON MONDAYS, TUESDAYS, WEDNESDAYS, THURSDAYS AND FRIDAYS. TAKE ONE AND ONE-HALF TABLET ON SATURDAYS AND SUNDAYS. 102 tablet 1   linaclotide (LINZESS) 290 MCG CAPS capsule Take 290 mcg by mouth daily.     metoprolol succinate (TOPROL-XL) 100 MG 24 hr tablet Take 100 mg by mouth daily.     Multiple Vitamin (MULTIVITAMIN ADULT PO) Take 1 tablet by mouth daily.     ondansetron (ZOFRAN-ODT) 8 MG disintegrating tablet TAKE 1 TABLET BY MOUTH EVERY 8 HOURS AS NEEDED FOR NAUSEA 60 tablet 0   potassium chloride SA (KLOR-CON M) 20 MEQ tablet Take 40 mEq by mouth 2 (two) times daily.     pregabalin (LYRICA) 100 MG capsule Take by mouth.     spironolactone (ALDACTONE) 25 MG tablet Take 25 mg by mouth daily.     tiZANidine (ZANAFLEX) 4 MG tablet Take 4-8 mg by mouth at bedtime.     topiramate (TOPAMAX) 200 MG tablet Take 200 mg by mouth 2 (two) times daily.     traZODone (DESYREL) 100 MG tablet Take 100 mg by mouth at bedtime.     Vilazodone HCl (VIIBRYD) 40 MG TABS Take 1 tablet (40 mg total) by mouth daily. 90 tablet 1   XARELTO 20 MG TABS tablet Take by mouth.     No current facility-administered medications for this visit.    Review of Systems  Constitutional: Positive for unexpected weight change.  HENT: HENT negative.  Eyes: Eyes negative.  Respiratory: Positive for shortness of breath.  Cardiovascular: Positive for chest pain, dyspnea with exertion and leg swelling.  GI: Positive for abdominal pain and nausea.  Musculoskeletal: Positive for back pain.  Skin:  Skin negative.  Neurological: Neurological negative. Hematologic: Hematologic/lymphatic negative.  Psychiatric: Psychiatric negative.        Objective:  Objective   Vitals:   08/30/21 1452  BP: 130/87  Pulse: (!) 107  Resp: 20  Temp: 98.5 F (36.9 C)  SpO2: 98%  Weight: 165 lb (74.8 kg)  Height: 5\' 1"  (1.549 m)   Body mass index is 31.18 kg/m.  Physical Exam HENT:     Head: Normocephalic.     Nose: Nose normal.  Eyes:  Pupils: Pupils are equal, round, and reactive to light.  Neck:     Vascular: No carotid bruit.  Cardiovascular:     Rate and Rhythm: Normal rate.  Pulmonary:     Effort: Pulmonary effort is normal.     Breath sounds: Normal breath sounds.  Abdominal:     General: Abdomen is flat. There is no distension.     Palpations: Abdomen is soft.     Tenderness: There is no abdominal tenderness.  Musculoskeletal:        General: Normal range of motion.     Right lower leg: No edema.     Left lower leg: No edema.  Skin:    General: Skin is warm and dry.     Capillary Refill: Capillary refill takes less than 2 seconds.  Neurological:     General: No focal deficit present.     Mental Status: She is alert.  Psychiatric:        Mood and Affect: Mood normal.        Thought Content: Thought content normal.     Data: CTA CHEST ABDOMEN PELVIS WWO, 08/16/2021 4:36 PM  INDICATION:Anomalous venous return \ I87.1 May-Thurner syndrome \ G93.5 Chiari malformation type I (HCC) \ R06.09 Dyspnea on exertion \ R07.1 Chest pain on breathing  COMPARISON: 12/26/2020 CT abdomen pelvis  LIMITATIONS: Protocol has been optimized for aorta and its branches, with decreased sensitivity for detection of soft tissue pathology and other vessels. Non-gated technique limits cardiac detail.  TECHNIQUE: Scout and centrally focused pre-contrast axial images of the chest were first obtained, followed by a test bolus of contrast for timing purposes. Thereafter, a full dose of iodinated  contrast was administered intravenously by rapid injection, with further multi-slice axial sections acquired in the arterial phase from the thoracic inlet through the lower pelvis. Coronal maximal intensity projection images were created for comprehensive analysis and diagnosis of the regional circulation.  All CT scans at [facility] and [facility] are performed using radiation dose optimization techniques as appropriate to a performed exam, including but not limited to one or more of the following: automatic exposure control, adjustment of the mA and/or kV according to patient size, use of iterative reconstruction technique. In addition, our institution participates in a radiation dose monitoring program to optimize patient radiation exposure.  FINDINGS:  THORAX: Non-vascular: No significant soft tissue lesion or gross pathology identified. Heart: Normal size. No pericardial effusion. Thoracic Aorta: No evidence of aneurysm or dissection.  No significant stenosis or occlusion of major arch vessels. Pulmonary vessels: Normal anatomy. No evidence of obstruction or central pulmonary embolism. No evidence of anomalous venous return in the chest.  ABDOMEN AND PELVIS: Nonvascular: No evidence of bowel obstruction. Colonic diverticulosis without evidence of diverticulitis. Moderate colorectal stool burden. Left adnexal cyst measuring 3.2 cm (series 11, image 162). Hypoattenuating lesion in the anterior uterine body measuring 9 mm (series 17, image 168) which likely represents a small fibroid. Small periumbilical fat-containing hernia.  Abdominal aorta: No evidence of aneurysm dissection, significant stenosis, or occlusion. Celiac trunk and branches: Age-indeterminate small focal dissection flap of the proximal celiac artery with moderate focal stenosis (series 15, image 380; series 14, image 57) and poststenotic dilation measuring 8 mm. No evidence of  occlusion. Superior mesenteric trunk and branches: No evidence of aneurysm, significant stenosis, or occlusion. Inferior mesenteric trunk and branches: No evidence of aneurysm, significant stenosis, or occlusion. Renal arteries: No evidence of aneurysm, significant stenosis, or  occlusion. Duplicated left renal arteries. Iliac arteries: No evidence of aneurysm, significant stenosis, or occlusion. Femoral arteries: No evidence of aneurysm, significant stenosis, or occlusion. Venous circulation: Redemonstrated coil embolization of the left gonadal vein and stenting of the left common iliac and external iliac veins. IVC, portal vein and major veins patent.        Assessment/Plan:     36 year old female with multiple abdominal complaints and new evidence of small dissection flap in her celiac artery with post innominate dilatation up to 8 mm but with no evidence of occlusion and the superior mesenteric artery and IMA were patent by most recent CT which I do not have the images of today.  We reviewed her previous CTA from last August as well as a recent CT venogram which did demonstrate a dilated celiac artery but I could not evaluate for any dissection flap given the timing of the contrast.  Certainly possible that the patient could have median arcuate ligament syndrome but this did not appear present on any of her previous CTs.  We are going to get a mesenteric duplex to evaluate the flow dynamics of her mesenteric arteries and have her follow-up in 4 to 6 weeks.     Maeola Harman MD [facility]

## 2021-08-31 ENCOUNTER — Other Ambulatory Visit: Payer: Self-pay | Admitting: Medical-Surgical

## 2021-08-31 DIAGNOSIS — Z9889 Other specified postprocedural states: Secondary | ICD-10-CM | POA: Diagnosis not present

## 2021-08-31 DIAGNOSIS — F3181 Bipolar II disorder: Secondary | ICD-10-CM

## 2021-08-31 DIAGNOSIS — G935 Compression of brain: Secondary | ICD-10-CM | POA: Diagnosis not present

## 2021-09-03 ENCOUNTER — Encounter: Payer: Self-pay | Admitting: Vascular Surgery

## 2021-09-05 ENCOUNTER — Other Ambulatory Visit: Payer: Self-pay

## 2021-09-05 DIAGNOSIS — K551 Chronic vascular disorders of intestine: Secondary | ICD-10-CM

## 2021-09-08 ENCOUNTER — Other Ambulatory Visit: Payer: Self-pay | Admitting: Gastroenterology

## 2021-09-08 ENCOUNTER — Other Ambulatory Visit: Payer: Self-pay | Admitting: Medical-Surgical

## 2021-09-08 DIAGNOSIS — R110 Nausea: Secondary | ICD-10-CM

## 2021-10-02 ENCOUNTER — Encounter: Payer: Self-pay | Admitting: Obstetrics and Gynecology

## 2021-10-03 ENCOUNTER — Other Ambulatory Visit: Payer: Self-pay | Admitting: Family

## 2021-10-03 ENCOUNTER — Encounter: Payer: Self-pay | Admitting: Family

## 2021-10-03 DIAGNOSIS — D509 Iron deficiency anemia, unspecified: Secondary | ICD-10-CM

## 2021-10-04 ENCOUNTER — Ambulatory Visit (INDEPENDENT_AMBULATORY_CARE_PROVIDER_SITE_OTHER): Payer: Medicaid Other | Admitting: Vascular Surgery

## 2021-10-04 ENCOUNTER — Encounter: Payer: Self-pay | Admitting: Vascular Surgery

## 2021-10-04 ENCOUNTER — Other Ambulatory Visit: Payer: Self-pay

## 2021-10-04 ENCOUNTER — Ambulatory Visit (HOSPITAL_COMMUNITY)
Admission: RE | Admit: 2021-10-04 | Discharge: 2021-10-04 | Disposition: A | Discharge status: Home / Self Care | Payer: Medicaid Other | Source: Ambulatory Visit | Attending: Vascular Surgery | Admitting: Vascular Surgery

## 2021-10-04 VITALS — BP 112/74 | HR 90 | Temp 98.4°F | Resp 20 | Ht 61.0 in | Wt 167.0 lb

## 2021-10-04 DIAGNOSIS — I7779 Dissection of other artery: Secondary | ICD-10-CM | POA: Diagnosis not present

## 2021-10-04 DIAGNOSIS — I871 Compression of vein: Secondary | ICD-10-CM | POA: Diagnosis not present

## 2021-10-04 DIAGNOSIS — K551 Chronic vascular disorders of intestine: Secondary | ICD-10-CM | POA: Diagnosis not present

## 2021-10-04 DIAGNOSIS — G935 Compression of brain: Secondary | ICD-10-CM | POA: Diagnosis not present

## 2021-10-04 DIAGNOSIS — R0609 Other forms of dyspnea: Secondary | ICD-10-CM | POA: Diagnosis not present

## 2021-10-04 DIAGNOSIS — I209 Angina pectoris, unspecified: Secondary | ICD-10-CM | POA: Diagnosis not present

## 2021-10-04 DIAGNOSIS — I82402 Acute embolism and thrombosis of unspecified deep veins of left lower extremity: Secondary | ICD-10-CM | POA: Diagnosis not present

## 2021-10-04 DIAGNOSIS — I771 Stricture of artery: Secondary | ICD-10-CM

## 2021-10-04 NOTE — H&P (View-Only) (Signed)
Patient ID: Yesenia Harper, female   DOB: 1985/02/26, 36 y.o.   MRN: [id number]  Reason for Consult: Follow-up   Referred by Christen Butter, NP  Subjective:     HPI:  Yesenia Harper is a 36 y.o. 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  She now states that she has abdominal pain almost all the time and is requiring Zofran constantly.  She has pain in the middle of her abdomen radiating to the left upper quadrant.  She has not lost any weight with this problem and continues to have normal bowel function.  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  Past Medical History:  Diagnosis Date   Anxiety    Anxiety with depression 08/03/2019   Bilateral carpal tunnel syndrome 02/23/2011   Cervical radiculitis 09/17/2013   Chiari malformation type I (HCC) 04/28/2013   Cognitive impairment, mild, so stated 05/23/2014   Degenerative joint disease of cervical and lumbar spine 05/23/2014   Delivery of pregnancy by cesarean section    Depression    Dry cough 08/03/2019   Epidermal inclusion cyst 04/10/2012   Eye pain 04/24/2013   Fever of unknown origin (FUO) 11/05/2014   Fluid retention in legs 06/06/2021   Gastrointestinal symptoms 06/06/2021   Formatting of this note might be different from the original.     Last Assessment & Plan:  Formatting of this note might be different from the original.   Hematuria 12/18/2019   History of C-section 05/12/2014   Formatting of this note might be different from the original. 2006 LTCS for breech & NRFHR   History of DVT (deep vein thrombosis) 07/05/2020   Hypothyroidism    Lumbar radiculopathy 09/17/2013   May-Thurner syndrome 12/13/2020   MDD (major depressive disorder), recurrent episode, mild (HCC) 04/04/2012   Migraine headache without aura 04/10/2012   Myopia of both eyes 04/24/2013   Neck pain  09/17/2013   Neuropathy, median nerve 09/17/2013   Pain in joint involving left lower leg 07/05/2020   Pain of left lower extremity 07/05/2020   Scoliosis 12/18/2019   Visual disturbance 04/24/2013   Family History  Problem Relation Age of Onset   Hypertension Mother    Diabetes Mother    Colon polyps Mother    Hypertension Father    Diabetes Father    Colon polyps Father    Diabetes Maternal Grandmother    Hypertension Maternal Grandmother    Stroke Maternal Grandmother    Diabetes Maternal Grandfather    Hypertension Maternal Grandfather    Stroke Maternal Grandfather    Diabetes Paternal Grandmother    Hypertension Paternal Grandmother    Stroke Paternal Grandmother    Diabetes Paternal Grandfather    Hypertension Paternal Grandfather    Stroke Paternal Grandfather    Colon cancer Neg Hx    Pancreatic cancer Neg Hx    Stomach cancer Neg Hx    Esophageal cancer Neg Hx    Liver cancer Neg Hx    Past Surgical History:  Procedure Laterality Date   CESAREAN SECTION     IR RADIOLOGIST EVAL & MGMT  08/23/2020   IR RADIOLOGIST EVAL & MGMT  05/30/2021    Short Social History:  Social History   Tobacco Use   Smoking status: Never   Smokeless tobacco: Never  Substance Use Topics   Alcohol use: Not  Currently    Allergies  Allergen Reactions   Penicillins Itching and Rash    Patient feels itching in mouth. Patient feels itching in mouth.    Current Outpatient Medications  Medication Sig Dispense Refill   amLODipine (NORVASC) 2.5 MG tablet Take 2.5 mg by mouth 2 (two) times daily.     buPROPion (WELLBUTRIN XL) 150 MG 24 hr tablet Take 150 mg by mouth daily.     etonogestrel (NEXPLANON) 68 MG IMPL implant Inject 1 each into the skin once.     furosemide (LASIX) 20 MG tablet Take 20 mg by mouth daily.     isosorbide mononitrate (IMDUR) 30 MG 24 hr tablet Take 30 mg by mouth daily.     levothyroxine (SYNTHROID) 75 MCG tablet TAKE ONE TABLET BY MOUTH DAILY ON MONDAYS,  TUESDAYS, WEDNESDAYS, THURSDAYS AND FRIDAYS. TAKE ONE AND ONE-HALF TABLET ON SATURDAYS AND SUNDAYS. 102 tablet 1   linaclotide (LINZESS) 290 MCG CAPS capsule Take 290 mcg by mouth daily.     metoprolol succinate (TOPROL-XL) 100 MG 24 hr tablet Take 100 mg by mouth daily.     Multiple Vitamin (MULTIVITAMIN ADULT PO) Take 1 tablet by mouth daily.     ondansetron (ZOFRAN-ODT) 8 MG disintegrating tablet TAKE 1 TABLET BY MOUTH EVERY 8 HOURS AS NEEDED FOR NAUSEA 60 tablet 0   potassium chloride SA (KLOR-CON M) 20 MEQ tablet Take 40 mEq by mouth 2 (two) times daily.     spironolactone (ALDACTONE) 25 MG tablet Take 25 mg by mouth daily.     tiZANidine (ZANAFLEX) 4 MG tablet Take 4-8 mg by mouth at bedtime.     topiramate (TOPAMAX) 200 MG tablet Take 200 mg by mouth 2 (two) times daily.     traZODone (DESYREL) 100 MG tablet Take 100 mg by mouth at bedtime.     Vilazodone HCl (VIIBRYD) 40 MG TABS Take 1 tablet (40 mg total) by mouth daily. 90 tablet 1   XARELTO 20 MG TABS tablet Take by mouth.     No current facility-administered medications for this visit.    Review of Systems  Constitutional:  Constitutional negative. HENT: HENT negative.  Eyes: Eyes negative.  Respiratory: Respiratory negative.  Cardiovascular: Cardiovascular negative.  GI: Positive for abdominal pain.  Skin: Skin negative.  Neurological: Neurological negative. Hematologic: Hematologic/lymphatic negative.  Psychiatric: Psychiatric negative.        Objective:  Objective   Vitals:   10/04/21 0942  BP: 112/74  Pulse: 90  Resp: 20  Temp: 98.4 F (36.9 C)  SpO2: 98%  Weight: 167 lb (75.8 kg)  Height: 5\' 1"  (1.549 m)   Body mass index is 31.55 kg/m.  Physical Exam HENT:     Head: Normocephalic.     Nose: Nose normal.  Eyes:     Pupils: Pupils are equal, round, and reactive to light.  Neck:     Vascular: No carotid bruit.  Cardiovascular:     Rate and Rhythm: Normal rate.  Pulmonary:     Effort: Pulmonary  effort is normal.  Abdominal:     General: Abdomen is flat.  Skin:    General: Skin is warm and dry.     Capillary Refill: Capillary refill takes less than 2 seconds.  Neurological:     Mental Status: She is alert.  Psychiatric:        Mood and Affect: Mood normal.        Behavior: Behavior normal.        Thought  Content: Thought content normal.     Data: Duplex Findings:  +--------------------+--------+--------+------+--------+  Mesenteric          PSV cm/sEDV cm/sPlaqueComments  +--------------------+--------+--------+------+--------+  Celiac Artery Origin  408                           +--------------------+--------+--------+------+--------+  SMA Proximal          152     134                   +--------------------+--------+--------+------+--------+  SMA Mid               150     115                   +--------------------+--------+--------+------+--------+  CHA                   193      71                   +--------------------+--------+--------+------+--------+      Summary:  Mesenteric:  Normal Superior Mesenteric artery and Inferior Mesenteric artery findings.  70 to 99% stenosis in the celiac artery.      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  I have been concerned that possibly she has median arcuate ligament syndrome and I discussed with her the options being continued medical management versus CT scan evaluation versus angiography which could be dynamic.  We will start with angiography from right common femoral approach and we can evaluate the celiac artery dynamically possibly she would need an injection of her median arcuate ligament versus referral for release.  If this appears to be all poststenotic we could consider stenting at the time of the procedure.  I discussed with him that I  am not sure that this is the cause of all of her symptoms and they demonstrate good understanding of this.     Maeola Harman MD [facility]

## 2021-10-04 NOTE — Progress Notes (Signed)
 Patient ID: Yesenia Harper, female   DOB: 03/05/1985, 36 y.o.   MRN: [id number]  Reason for Consult: Follow-up   Referred by Jessup, Joy, NP  Subjective:     HPI:  Yesenia Harper is a 36 y.o. 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  She now states that she has abdominal pain almost all the time and is requiring Zofran constantly.  She has pain in the middle of her abdomen radiating to the left upper quadrant.  She has not lost any weight with this problem and continues to have normal bowel function.  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  Past Medical History:  Diagnosis Date   Anxiety    Anxiety with depression 08/03/2019   Bilateral carpal tunnel syndrome 02/23/2011   Cervical radiculitis 09/17/2013   Chiari malformation type I (HCC) 04/28/2013   Cognitive impairment, mild, so stated 05/23/2014   Degenerative joint disease of cervical and lumbar spine 05/23/2014   Delivery of pregnancy by cesarean section    Depression    Dry cough 08/03/2019   Epidermal inclusion cyst 04/10/2012   Eye pain 04/24/2013   Fever of unknown origin (FUO) 11/05/2014   Fluid retention in legs 06/06/2021   Gastrointestinal symptoms 06/06/2021   Formatting of this note might be different from the original.     Last Assessment & Plan:  Formatting of this note might be different from the original.   Hematuria 12/18/2019   History of C-section 05/12/2014   Formatting of this note might be different from the original. 2006 LTCS for breech & NRFHR   History of DVT (deep vein thrombosis) 07/05/2020   Hypothyroidism    Lumbar radiculopathy 09/17/2013   May-Thurner syndrome 12/13/2020   MDD (major depressive disorder), recurrent episode, mild (HCC) 04/04/2012   Migraine headache without aura 04/10/2012   Myopia of both eyes 04/24/2013   Neck pain  09/17/2013   Neuropathy, median nerve 09/17/2013   Pain in joint involving left lower leg 07/05/2020   Pain of left lower extremity 07/05/2020   Scoliosis 12/18/2019   Visual disturbance 04/24/2013   Family History  Problem Relation Age of Onset   Hypertension Mother    Diabetes Mother    Colon polyps Mother    Hypertension Father    Diabetes Father    Colon polyps Father    Diabetes Maternal Grandmother    Hypertension Maternal Grandmother    Stroke Maternal Grandmother    Diabetes Maternal Grandfather    Hypertension Maternal Grandfather    Stroke Maternal Grandfather    Diabetes Paternal Grandmother    Hypertension Paternal Grandmother    Stroke Paternal Grandmother    Diabetes Paternal Grandfather    Hypertension Paternal Grandfather    Stroke Paternal Grandfather    Colon cancer Neg Hx    Pancreatic cancer Neg Hx    Stomach cancer Neg Hx    Esophageal cancer Neg Hx    Liver cancer Neg Hx    Past Surgical History:  Procedure Laterality Date   CESAREAN SECTION     IR RADIOLOGIST EVAL & MGMT  08/23/2020   IR RADIOLOGIST EVAL & MGMT  05/30/2021    Short Social History:  Social History   Tobacco Use   Smoking status: Never   Smokeless tobacco: Never  Substance Use Topics   Alcohol use: Not   Currently    Allergies  Allergen Reactions   Penicillins Itching and Rash    Patient feels itching in mouth. Patient feels itching in mouth.    Current Outpatient Medications  Medication Sig Dispense Refill   amLODipine (NORVASC) 2.5 MG tablet Take 2.5 mg by mouth 2 (two) times daily.     buPROPion (WELLBUTRIN XL) 150 MG 24 hr tablet Take 150 mg by mouth daily.     etonogestrel (NEXPLANON) 68 MG IMPL implant Inject 1 each into the skin once.     furosemide (LASIX) 20 MG tablet Take 20 mg by mouth daily.     isosorbide mononitrate (IMDUR) 30 MG 24 hr tablet Take 30 mg by mouth daily.     levothyroxine (SYNTHROID) 75 MCG tablet TAKE ONE TABLET BY MOUTH DAILY ON MONDAYS,  TUESDAYS, WEDNESDAYS, THURSDAYS AND FRIDAYS. TAKE ONE AND ONE-HALF TABLET ON SATURDAYS AND SUNDAYS. 102 tablet 1   linaclotide (LINZESS) 290 MCG CAPS capsule Take 290 mcg by mouth daily.     metoprolol succinate (TOPROL-XL) 100 MG 24 hr tablet Take 100 mg by mouth daily.     Multiple Vitamin (MULTIVITAMIN ADULT PO) Take 1 tablet by mouth daily.     ondansetron (ZOFRAN-ODT) 8 MG disintegrating tablet TAKE 1 TABLET BY MOUTH EVERY 8 HOURS AS NEEDED FOR NAUSEA 60 tablet 0   potassium chloride SA (KLOR-CON M) 20 MEQ tablet Take 40 mEq by mouth 2 (two) times daily.     spironolactone (ALDACTONE) 25 MG tablet Take 25 mg by mouth daily.     tiZANidine (ZANAFLEX) 4 MG tablet Take 4-8 mg by mouth at bedtime.     topiramate (TOPAMAX) 200 MG tablet Take 200 mg by mouth 2 (two) times daily.     traZODone (DESYREL) 100 MG tablet Take 100 mg by mouth at bedtime.     Vilazodone HCl (VIIBRYD) 40 MG TABS Take 1 tablet (40 mg total) by mouth daily. 90 tablet 1   XARELTO 20 MG TABS tablet Take by mouth.     No current facility-administered medications for this visit.    Review of Systems  Constitutional:  Constitutional negative. HENT: HENT negative.  Eyes: Eyes negative.  Respiratory: Respiratory negative.  Cardiovascular: Cardiovascular negative.  GI: Positive for abdominal pain.  Skin: Skin negative.  Neurological: Neurological negative. Hematologic: Hematologic/lymphatic negative.  Psychiatric: Psychiatric negative.        Objective:  Objective   Vitals:   10/04/21 0942  BP: 112/74  Pulse: 90  Resp: 20  Temp: 98.4 F (36.9 C)  SpO2: 98%  Weight: 167 lb (75.8 kg)  Height: 5\' 1"  (1.549 m)   Body mass index is 31.55 kg/m.  Physical Exam HENT:     Head: Normocephalic.     Nose: Nose normal.  Eyes:     Pupils: Pupils are equal, round, and reactive to light.  Neck:     Vascular: No carotid bruit.  Cardiovascular:     Rate and Rhythm: Normal rate.  Pulmonary:     Effort: Pulmonary  effort is normal.  Abdominal:     General: Abdomen is flat.  Skin:    General: Skin is warm and dry.     Capillary Refill: Capillary refill takes less than 2 seconds.  Neurological:     Mental Status: She is alert.  Psychiatric:        Mood and Affect: Mood normal.        Behavior: Behavior normal.        Thought  Content: Thought content normal.     Data: Duplex Findings:  +--------------------+--------+--------+------+--------+  Mesenteric          PSV cm/sEDV cm/sPlaqueComments  +--------------------+--------+--------+------+--------+  Celiac Artery Origin  408                           +--------------------+--------+--------+------+--------+  SMA Proximal          152     134                   +--------------------+--------+--------+------+--------+  SMA Mid               150     115                   +--------------------+--------+--------+------+--------+  CHA                   193      71                   +--------------------+--------+--------+------+--------+      Summary:  Mesenteric:  Normal Superior Mesenteric artery and Inferior Mesenteric artery findings.  70 to 99% stenosis in the celiac artery.      Assessment/Plan:    36-year-old female with a long history of abdominal pain and recent notable dissection of her celiac artery now with severe stenosis with peak systolic velocity over 400.  I have been concerned that possibly she has median arcuate ligament syndrome and I discussed with her the options being continued medical management versus CT scan evaluation versus angiography which could be dynamic.  We will start with angiography from right common femoral approach and we can evaluate the celiac artery dynamically possibly she would need an injection of her median arcuate ligament versus referral for release.  If this appears to be all poststenotic we could consider stenting at the time of the procedure.  I discussed with him that I  am not sure that this is the cause of all of her symptoms and they demonstrate good understanding of this.     Lyn Deemer Christopher Jasaiah Karwowski MD [facility]   

## 2021-10-11 ENCOUNTER — Inpatient Hospital Stay: Payer: Medicaid Other | Admitting: Family

## 2021-10-11 ENCOUNTER — Encounter: Payer: Self-pay | Admitting: Vascular Surgery

## 2021-10-11 ENCOUNTER — Inpatient Hospital Stay: Payer: Medicaid Other | Attending: Medical-Surgical

## 2021-10-16 ENCOUNTER — Encounter (HOSPITAL_COMMUNITY)
Admission: RE | Disposition: A | Discharge status: Home / Self Care | Payer: Self-pay | Source: Home / Self Care | Attending: Vascular Surgery

## 2021-10-16 ENCOUNTER — Other Ambulatory Visit: Payer: Self-pay

## 2021-10-16 ENCOUNTER — Ambulatory Visit (HOSPITAL_COMMUNITY)
Admission: RE | Admit: 2021-10-16 | Discharge: 2021-10-16 | Disposition: A | Discharge status: Home / Self Care | Payer: Medicaid Other | Attending: Vascular Surgery | Admitting: Vascular Surgery

## 2021-10-16 DIAGNOSIS — I774 Celiac artery compression syndrome: Secondary | ICD-10-CM | POA: Diagnosis not present

## 2021-10-16 DIAGNOSIS — I771 Stricture of artery: Secondary | ICD-10-CM | POA: Diagnosis not present

## 2021-10-16 DIAGNOSIS — R109 Unspecified abdominal pain: Secondary | ICD-10-CM | POA: Diagnosis not present

## 2021-10-16 DIAGNOSIS — Z7901 Long term (current) use of anticoagulants: Secondary | ICD-10-CM | POA: Insufficient documentation

## 2021-10-16 DIAGNOSIS — M79605 Pain in left leg: Secondary | ICD-10-CM

## 2021-10-16 DIAGNOSIS — R198 Other specified symptoms and signs involving the digestive system and abdomen: Secondary | ICD-10-CM

## 2021-10-16 HISTORY — PX: VISCERAL ANGIOGRAPHY: CATH118276

## 2021-10-16 LAB — POCT I-STAT, CHEM 8
BUN: 11 mg/dL (ref 6–20)
Calcium, Ion: 1.17 mmol/L (ref 1.15–1.40)
Chloride: 107 mmol/L (ref 98–111)
Creatinine, Ser: 0.8 mg/dL (ref 0.44–1.00)
Glucose, Bld: 84 mg/dL (ref 70–99)
HCT: 38 % (ref 36.0–46.0)
Hemoglobin: 12.9 g/dL (ref 12.0–15.0)
Potassium: 3.4 mmol/L — ABNORMAL LOW (ref 3.5–5.1)
Sodium: 141 mmol/L (ref 135–145)
TCO2: 22 mmol/L (ref 22–32)

## 2021-10-16 LAB — PREGNANCY, URINE: Preg Test, Ur: NEGATIVE

## 2021-10-16 SURGERY — VISCERAL ANGIOGRAPHY
Anesthesia: LOCAL

## 2021-10-16 MED ORDER — MIDAZOLAM HCL 2 MG/2ML IJ SOLN
INTRAMUSCULAR | Status: DC | PRN
  Administered 2021-10-16: 1 mg via INTRAVENOUS

## 2021-10-16 MED ORDER — HYDRALAZINE HCL 20 MG/ML IJ SOLN: 5.0000 mg | INTRAMUSCULAR | Status: DC | PRN

## 2021-10-16 MED ORDER — FENTANYL CITRATE (PF) 100 MCG/2ML IJ SOLN
INTRAMUSCULAR | Status: AC
  Filled 2021-10-16: qty 2

## 2021-10-16 MED ORDER — ACETAMINOPHEN 325 MG PO TABS: 650.0000 mg | ORAL_TABLET | ORAL | Status: DC | PRN

## 2021-10-16 MED ORDER — SODIUM CHLORIDE 0.9 % IV SOLN: INTRAVENOUS | Status: DC

## 2021-10-16 MED ORDER — ASPIRIN 81 MG PO TBEC: 81.0000 mg | DELAYED_RELEASE_TABLET | Freq: Every day | ORAL | Status: DC

## 2021-10-16 MED ORDER — OXYCODONE HCL 5 MG PO TABS: 5.0000 mg | ORAL_TABLET | ORAL | Status: DC | PRN

## 2021-10-16 MED ORDER — ONDANSETRON HCL 4 MG/2ML IJ SOLN: 4.0000 mg | Freq: Four times a day (QID) | INTRAMUSCULAR | Status: DC | PRN

## 2021-10-16 MED ORDER — FENTANYL CITRATE (PF) 100 MCG/2ML IJ SOLN
INTRAMUSCULAR | Status: DC | PRN
  Administered 2021-10-16: 50 ug via INTRAVENOUS

## 2021-10-16 MED ORDER — LIDOCAINE HCL (PF) 1 % IJ SOLN
INTRAMUSCULAR | Status: DC | PRN
  Administered 2021-10-16: 20 mL via INTRADERMAL

## 2021-10-16 MED ORDER — SODIUM CHLORIDE 0.9 % IV SOLN: 250.0000 mL | INTRAVENOUS | Status: DC | PRN

## 2021-10-16 MED ORDER — LIDOCAINE HCL (PF) 1 % IJ SOLN
INTRAMUSCULAR | Status: AC
  Filled 2021-10-16: qty 30

## 2021-10-16 MED ORDER — MIDAZOLAM HCL 2 MG/2ML IJ SOLN
INTRAMUSCULAR | Status: AC
  Filled 2021-10-16: qty 2

## 2021-10-16 MED ORDER — MORPHINE SULFATE (PF) 2 MG/ML IV SOLN: 2.0000 mg | INTRAVENOUS | Status: DC | PRN

## 2021-10-16 MED ORDER — SODIUM CHLORIDE 0.9% FLUSH: 3.0000 mL | INTRAVENOUS | Status: DC | PRN

## 2021-10-16 MED ORDER — LABETALOL HCL 5 MG/ML IV SOLN: 10.0000 mg | INTRAVENOUS | Status: DC | PRN

## 2021-10-16 MED ORDER — SODIUM CHLORIDE 0.9% FLUSH: 3.0000 mL | Freq: Two times a day (BID) | INTRAVENOUS | Status: DC

## 2021-10-16 MED ORDER — HEPARIN (PORCINE) IN NACL 1000-0.9 UT/500ML-% IV SOLN
INTRAVENOUS | Status: AC
  Filled 2021-10-16: qty 1000

## 2021-10-16 MED ORDER — IODIXANOL 320 MG/ML IV SOLN
INTRAVENOUS | Status: DC | PRN
  Administered 2021-10-16: 60 mL via INTRA_ARTERIAL

## 2021-10-16 MED ORDER — HEPARIN (PORCINE) IN NACL 1000-0.9 UT/500ML-% IV SOLN
INTRAVENOUS | Status: DC | PRN
  Administered 2021-10-16 (×2): 500 mL

## 2021-10-16 SURGICAL SUPPLY — 11 items
CATH OMNI FLUSH 5F 65CM (CATHETERS) IMPLANT
CLOSURE MYNX CONTROL 6F/7F (Vascular Products) IMPLANT
GLIDEWIRE ANGLED NITR .018X260 (WIRE) IMPLANT
KIT MICROPUNCTURE NIT STIFF (SHEATH) IMPLANT
KIT PV (KITS) ×1 IMPLANT
SHEATH PINNACLE 6F 10CM (SHEATH) IMPLANT
SHEATH PROBE COVER 6X72 (BAG) IMPLANT
SYR MEDRAD MARK V 150ML (SYRINGE) IMPLANT
TRANSDUCER W/STOPCOCK (MISCELLANEOUS) ×1 IMPLANT
TRAY PV CATH (CUSTOM PROCEDURE TRAY) ×1 IMPLANT
WIRE BENTSON .035X145CM (WIRE) IMPLANT

## 2021-10-16 NOTE — Op Note (Signed)
    Patient name: Yesenia Harper MRN: [id number] DOB: [birth year] Sex: female  10/16/2021 Pre-operative Diagnosis: Abdominal pain with possible celiac artery stenosis Post-operative diagnosis:  Same Surgeon:  Erlene Quan C. Donzetta Matters, MD Procedure Performed: 1.  Ultrasound-guided cannulation right common femoral artery 2.  Aortogram with expiration and inspiration 3.  Moderate sedation with fentanyl and Versed for 20 minutes 4.  Mynx device closure right common femoral artery  Indications: 36 year old female with history of abdominal pain is undergone stenting of May Thurner as well as coil embolization of gonadal vein on the left.  She has a CT scan which had concern for dissection in the celiac artery and ultrasound which demonstrated stenosis.  She now indicated for aortogram with inspiration and expiration.  Findings: Aortogram demonstrated no flow-limiting stenosis SMA and celiac filled well.  With full expiration there was some impingement of the celiac artery although distal flow appeared intact.  Plan will be for celiac ganglion block and possibly will need median arcuate ligament release if this is therapeutic.   Procedure:  The patient was identified in the holding area and taken to room 8.  The patient was then placed supine on the table and prepped and draped in the usual sterile fashion.  A time out was called.  Ultrasound was used to evaluate the right common femoral artery which was noted be patent.  The area was anesthetized 1% lidocaine cannulated by functional followed by wire sheath.  And images saved department record.  Concomitantly we administered fentanyl and Versed with moderate sedation and her vital signs were monitored throughout the case.  Bentson wires placed followed by 5 French sheath and Omni catheter placed to the level of T10 and aortogram and AP view was initially performed.  We then performed 2 additional views laterally with full inspiration and expiration.  With the above  findings we will plan for possible stellate ganglion block with interventional radiology.  Catheter was removed over wire.  Mynx device was deployed.  She tolerated procedure without any complication.  Contrast: 60c   Terence Googe C. Donzetta Matters, MD [facility] Office: [phone] Pager: [phone]

## 2021-10-16 NOTE — Interval H&P Note (Signed)
History and Physical Interval Note:  10/16/2021 11:15 AM  Yesenia Harper  has presented today for surgery, with the diagnosis of celiac artery stenosis.  The various methods of treatment have been discussed with the patient and family. After consideration of risks, benefits and other options for treatment, the patient has consented to  Procedure(s): MESENTRIC ANGIOGRAPHY (N/A) as a surgical intervention.  The patient's history has been reviewed, patient examined, no change in status, stable for surgery.  I have reviewed the patient's chart and labs.  Questions were answered to the patient's satisfaction.     Servando Snare

## 2021-10-17 ENCOUNTER — Encounter (HOSPITAL_COMMUNITY): Payer: Self-pay | Admitting: Vascular Surgery

## 2021-10-18 ENCOUNTER — Other Ambulatory Visit: Payer: Self-pay | Admitting: Medical-Surgical

## 2021-10-18 ENCOUNTER — Other Ambulatory Visit: Payer: Self-pay | Admitting: Gastroenterology

## 2021-10-18 DIAGNOSIS — R110 Nausea: Secondary | ICD-10-CM

## 2021-10-19 ENCOUNTER — Telehealth: Payer: Self-pay

## 2021-10-19 ENCOUNTER — Ambulatory Visit: Payer: Medicaid Other | Admitting: Obstetrics and Gynecology

## 2021-10-19 NOTE — Telephone Encounter (Signed)
Pt's spouse, Jonni Sanger, called stating that the pt had a celiac artery stent placed and she has continued having severe nausea and is "basically living on Zofran". Pt is supposed to see an IR doc to discuss a nerve injection or having a tendon/ligament of her diaphragm released. Seeking advice.  Reviewed pt's chart, returned call for clarification, two identifiers used. Pt states that she has not gotten a call from Dr Geroge Baseman with IR yet and is miserable. Every time she eats, she is nauseated and vomiting. She is requesting a referral to general surgery since the two options from IR are not guaranteed to work long-term or at all.  Referral to general surgery at requested [facility] facility sent.

## 2021-10-20 ENCOUNTER — Other Ambulatory Visit: Payer: Self-pay | Admitting: Vascular Surgery

## 2021-10-20 DIAGNOSIS — I774 Celiac artery compression syndrome: Secondary | ICD-10-CM

## 2021-10-26 ENCOUNTER — Encounter (HOSPITAL_COMMUNITY): Payer: Self-pay | Admitting: Vascular Surgery

## 2021-10-26 ENCOUNTER — Ambulatory Visit
Admission: RE | Admit: 2021-10-26 | Discharge: 2021-10-26 | Disposition: A | Discharge status: Home / Self Care | Payer: Medicaid Other | Source: Ambulatory Visit | Attending: Vascular Surgery | Admitting: Vascular Surgery

## 2021-10-26 ENCOUNTER — Other Ambulatory Visit: Payer: Self-pay | Admitting: Interventional Radiology

## 2021-10-26 DIAGNOSIS — R1084 Generalized abdominal pain: Secondary | ICD-10-CM | POA: Diagnosis not present

## 2021-10-26 DIAGNOSIS — I774 Celiac artery compression syndrome: Secondary | ICD-10-CM

## 2021-10-26 HISTORY — PX: IR RADIOLOGIST EVAL & MGMT: IMG5224

## 2021-10-26 NOTE — Progress Notes (Signed)
Chief Complaint: Patient was consulted remotely today (TeleHealth) for possible median arcuate ligament syndrome at the request of Cain,Brandon Christopher.    Referring Physician(s): Cain,Brandon Christopher  History of Present Illness: Yesenia Harper is a 36 y.o. female who I first saw back in August 2022.  At that time, she was referred to me by Dr. Oneida Alar to assess for possible pelvic congestion syndrome due to a constellation of symptoms including left calf pain, left foot and ankle swelling, left back and flank pain and left lower abdominal pain, heaviness, aching and bloating.  I assessed her and felt she had a high probability of pelvic congestion syndrome with profound reflux in the left ovarian vein.  We felt her left lower extremity symptoms were likely related although there was some mild superficial venous insufficiency as well.  Additionally, as a female of her age, [name] was a consideration.  She did have a history of DVT diagnosed in 2006.  Unfortunately, her insurance would not approve treatment for pelvic congestion syndrome.  Ultimately, she changed jobs and ensures and sought a second opinion at [facility].  Dr. Ned Card was able to see her and diagnosed her with both pelvic congestion syndrome and [name].  She underwent embolization of the left ovarian vein as well as stenting of the left common and external iliac veins in November 2022.  She felt great following the procedure for approximately 4-6 weeks before developing acute onset of severe pain while she was out of town.  She was assessed at an out-of-town emergency room and told that she had nonocclusive DVT in the common femoral vein.  Unfortunately, she left AMA as [place]  She was not treated with anticoagulation at that time.  Following this she began to develop recurrent left lower extremity edema, heaviness and color changes in  her left foot.  She followed up with Dr. Pecola Lawless who felt that her interventional therapy was maximized and he had nothing further to offer her.  In April, she had a second left lower extremity DVT which was negative for any evidence of deep venous thrombosis or superficial venous thrombosis.  She was recently seen by hematology to evaluate for a clotting disorder.  She has been evaluated at the congestive heart failure clinic at [facility] for potential heart related issues.  Additionally, she has a Chiari malformation and recently underwent neurosurgical intervention.    More recently she has developed post prandial abdominal pain and nausea and was worked up by Dr. Servando Snare for possible celiac artery dissection and/or MALS.  Angiography showed some impingement of the celiac artery at full expiration.  She presents today to discuss possible celiac plexus block prior to considering MAL release surgery.   Her symptoms are quite severe, she rates her pain a 7-8/10 after eating.  She is also experiencing severe nausea and taking zofran daily.   Past Medical History:  Diagnosis Date   Anxiety    Anxiety with depression 08/03/2019   Bilateral carpal tunnel syndrome 02/23/2011   Cervical radiculitis 09/17/2013   Chiari malformation type I ([place]) 04/28/2013   Cognitive impairment, mild, so stated 05/23/2014   Degenerative joint disease of cervical and lumbar spine 05/23/2014   Delivery of pregnancy by cesarean section    Depression    Dry cough 08/03/2019   Epidermal inclusion cyst 04/10/2012   Eye pain 04/24/2013   Fever of unknown origin (FUO)  11/05/2014   Fluid retention in legs 06/06/2021   Gastrointestinal symptoms 06/06/2021   Formatting of this note might be different from the original.     Last Assessment & Plan:  Formatting of this note might be different from the original.   Hematuria 12/18/2019   History of C-section 05/12/2014   Formatting of this note might be different from the original.  2006 LTCS for breech & NRFHR   History of DVT (deep vein thrombosis) 07/05/2020   Hypothyroidism    Lumbar radiculopathy 09/17/2013   May-Thurner syndrome 12/13/2020   MDD (major depressive disorder), recurrent episode, mild ([place]) 04/04/2012   Migraine headache without aura 04/10/2012   Myopia of both eyes 04/24/2013   Neck pain 09/17/2013   Neuropathy, median nerve 09/17/2013   Pain in joint involving left lower leg 07/05/2020   Pain of left lower extremity 07/05/2020   Scoliosis 12/18/2019   Visual disturbance 04/24/2013    Past Surgical History:  Procedure Laterality Date   CESAREAN SECTION     IR RADIOLOGIST EVAL & MGMT  08/23/2020   IR RADIOLOGIST EVAL & MGMT  05/30/2021   VISCERAL ANGIOGRAPHY N/A 10/16/2021   Procedure: MESENTRIC ANGIOGRAPHY;  Surgeon: Waynetta Sandy, MD;  Location: [facility];  Service: Cardiovascular;  Laterality: N/A;    Allergies: Penicillins  Medications: Prior to Admission medications   Medication Sig Start Date End Date Taking? Authorizing Provider  AJOVY 225 MG/1.5ML SOAJ Inject 225 mg into the skin every 30 (thirty) days. 10/03/21   [provider]  ALPRAZolam Duanne Moron) 0.25 MG tablet Take 0.25 mg by mouth 2 (two) times daily as needed for anxiety. 09/24/21   [provider]  amLODipine (NORVASC) 2.5 MG tablet Take 2.5 mg by mouth 2 (two) times daily. 08/09/21   [provider]  buPROPion (WELLBUTRIN XL) 150 MG 24 hr tablet Take 150 mg by mouth in the morning. 07/25/21   [provider]  cyanocobalamin (VITAMIN B12) 1000 MCG/ML injection Inject 1,000 mcg into the muscle every Wednesday.    [provider]  etonogestrel (NEXPLANON) 68 MG IMPL implant Inject 1 each into the skin once.    [provider]  furosemide (LASIX) 20 MG tablet Take 20 mg by mouth in the morning. 08/09/21   [provider]  isosorbide mononitrate (IMDUR) 30 MG 24 hr tablet Take 30 mg by mouth in the morning and at  bedtime. 08/09/21   [provider]  levothyroxine (SYNTHROID) 75 MCG tablet TAKE ONE TABLET BY MOUTH DAILY ON MONDAYS, [place], [place], THURSDAYS AND FRIDAYS. TAKE ONE AND ONE-HALF TABLET ON SATURDAYS AND SUNDAYS. Patient taking differently: Take 75 mcg by mouth daily before breakfast. 06/13/21   Samuel Bouche, NP  linaclotide (LINZESS) 290 MCG CAPS capsule Take 290 mcg by mouth daily.    [provider]  Multiple Vitamin (MULTIVITAMIN ADULT PO) Take 1 tablet by mouth in the morning.    [provider]  NURTEC 75 MG TBDP Take 75 mg by mouth daily as needed for migraine. 08/23/21   [provider]  ondansetron (ZOFRAN-ODT) 8 MG disintegrating tablet TAKE 1 TABLET BY MOUTH EVERY 8 HOURS AS NEEDED FOR NAUSEA 10/19/21   Cirigliano, Vito V, DO  potassium chloride SA (KLOR-CON M) 20 MEQ tablet Take 20 mEq by mouth 2 (two) times daily. 03/23/21   [provider]  spironolactone (ALDACTONE) 25 MG tablet Take 25 mg by mouth in the morning. 04/17/21   [provider]  tiZANidine (ZANAFLEX)  4 MG tablet Take 4 mg by mouth at bedtime. 08/23/21   [provider]  topiramate (TOPAMAX) 200 MG tablet Take 200 mg by mouth 2 (two) times daily. 07/21/19   [provider]  Vilazodone HCl (VIIBRYD) 40 MG TABS Take 1 tablet (40 mg total) by mouth daily. 05/02/21   Samuel Bouche, NP  Vitamin D, Ergocalciferol, (DRISDOL) 1.25 MG (50000 UNIT) CAPS capsule Take 50,000 Units by mouth every Thursday. 08/17/21   [provider]  XARELTO 20 MG TABS tablet Take 20 mg by mouth in the morning. 08/25/21   [provider]     Family History  Problem Relation Age of Onset   Hypertension Mother    Diabetes Mother    Colon polyps Mother    Hypertension Father    Diabetes Father    Colon polyps Father    Diabetes Maternal Grandmother    Hypertension Maternal Grandmother    Stroke Maternal Grandmother    Diabetes Maternal Grandfather    Hypertension  Maternal Grandfather    Stroke Maternal Grandfather    Diabetes Paternal Grandmother    Hypertension Paternal Grandmother    Stroke Paternal Grandmother    Diabetes Paternal Grandfather    Hypertension Paternal Grandfather    Stroke Paternal Grandfather    Colon cancer Neg Hx    Pancreatic cancer Neg Hx    Stomach cancer Neg Hx    Esophageal cancer Neg Hx    Liver cancer Neg Hx     Social History   Socioeconomic History   Marital status: Married    Spouse name: Not on file   Number of children: 2   Years of education: Not on file   Highest education level: Not on file  Occupational History   Not on file  Tobacco Use   Smoking status: Never   Smokeless tobacco: Never  Vaping Use   Vaping Use: Never used  Substance and Sexual Activity   Alcohol use: Not Currently   Drug use: Never   Sexual activity: Yes    Partners: Male    Birth control/protection: Implant  Other Topics Concern   Not on file  Social History Narrative   Not on file   Social Determinants of Health   Financial Resource Strain: Not on file  Food Insecurity: Not on file  Transportation Needs: Not on file  Physical Activity: Not on file  Stress: Not on file  Social Connections: Not on file    Review of Systems  Review of Systems: A 12 point ROS discussed and pertinent positives are indicated in the HPI above.  All other systems are negative.    Physical Exam No direct physical exam was performed (except for noted visual exam findings with Video Visits).    Vital Signs: There were no vitals taken for this visit.  Imaging: PERIPHERAL VASCULAR CATHETERIZATION  Result Date: 10/16/2021 Images from the original result were not included. Patient name: Yesenia Harper MRN: [id number] DOB: [birth year] Sex: female 10/16/2021 Pre-operative Diagnosis: Abdominal pain with possible celiac artery stenosis Post-operative diagnosis:  Same Surgeon:  Erlene Quan C. Donzetta Matters, MD Procedure Performed: 1.  Ultrasound-guided  cannulation right common femoral artery 2.  Aortogram with expiration and inspiration 3.  Moderate sedation with fentanyl and Versed for 20 minutes 4.  Mynx device closure right common femoral artery Indications: 36 year old female with history of abdominal pain is undergone stenting of May Thurner as well as coil embolization of gonadal vein on the left.  She has a CT  scan which had concern for dissection in the celiac artery and ultrasound which demonstrated stenosis.  She now indicated for aortogram with inspiration and expiration. Findings: Aortogram demonstrated no flow-limiting stenosis SMA and celiac filled well.  With full expiration there was some impingement of the celiac artery although distal flow appeared intact. Plan will be for celiac ganglion block and possibly will need median arcuate ligament release if this is therapeutic.  Procedure:  The patient was identified in the holding area and taken to room 8.  The patient was then placed supine on the table and prepped and draped in the usual sterile fashion.  A time out was called.  Ultrasound was used to evaluate the right common femoral artery which was noted be patent.  The area was anesthetized 1% lidocaine cannulated by functional followed by wire sheath.  And images saved department record.  Concomitantly we administered fentanyl and Versed with moderate sedation and her vital signs were monitored throughout the case.  Bentson wires placed followed by 5 French sheath and Omni catheter placed to the level of T10 and aortogram and AP view was initially performed.  We then performed 2 additional views laterally with full inspiration and expiration.  With the above findings we will plan for possible stellate ganglion block with interventional radiology.  Catheter was removed over wire.  Mynx device was deployed.  She tolerated procedure without any complication. Contrast: 60c Brandon C. Donzetta Matters, MD [facility] Office:  [phone] Pager: [phone]   VAS [place] MESENTERIC  Result Date: 10/04/2021 ABDOMINAL VISCERAL Patient Name:  Yesenia Harper  Date of Exam:   10/04/2021 Medical Rec #: [id number]       Accession #:    [id number] Date of Birth: December 13, 1985       Patient Gender: F Patient Age:   36 years Exam Location:  [facility] Procedure:      VAS [place] MESENTERIC Referring Phys: [id number] [place] -------------------------------------------------------------------------------- High Risk Factors: No history of smoking. Other Factors: May-Thurner syndrome. Limitations: Air/bowel gas, obesity and Technically difficult exam due to excessive overlying bowel gas. Comparison Study: small dissection flap in her celiac artery with post                   innominate dilatation up to 8 mm but with no evidence of                   occlusion and the superior mesenteric artery and IMA were                   patent Performing Technologist: Wilkie Aye RVT  Examination Guidelines: A complete evaluation includes B-mode imaging, spectral Doppler, color Doppler, and power Doppler as needed of all accessible portions of each vessel. Bilateral testing is considered an integral part of a complete examination. Limited examinations for reoccurring indications may be performed as noted.  Duplex Findings: +--------------------+--------+--------+------+--------+ Mesenteric          PSV cm/sEDV cm/sPlaqueComments +--------------------+--------+--------+------+--------+ Celiac Artery Origin  408                          +--------------------+--------+--------+------+--------+ SMA Proximal          152     134                  +--------------------+--------+--------+------+--------+ SMA Mid  150     115                  +--------------------+--------+--------+------+--------+ CHA                   193      71                  +--------------------+--------+--------+------+--------+   Summary: Mesenteric: Normal Superior Mesenteric artery and Inferior Mesenteric artery findings. 70 to 99% stenosis in the celiac artery.  *See table(s) above for measurements and observations.  Diagnosing physician: Servando Snare MD  Electronically signed by Servando Snare MD on 10/04/2021 at 9:36:28 AM.    Final     Labs:  CBC: Recent Labs    12/13/20 0000 04/21/21 1839 05/12/21 0000 05/29/21 1119 10/16/21 1040  WBC 7.2 8.4 7.0 12.1*  --   HGB 12.5 13.0 13.1 14.4 12.9  HCT 36.7 38.1 39.1 43.3 38.0  PLT 342 315 329 398  --     COAGS: Recent Labs    12/29/20 0939 05/12/21 0000  INR 1.0 1.0  APTT  --  30    BMP: Recent Labs    12/13/20 0000 02/13/21 1136 04/21/21 1839 05/12/21 0000 05/29/21 1119 10/16/21 1040  NA 139  --  141 140 137 141  K 3.9  --  3.2* 3.6 3.6 3.4*  CL 108  --  110 107 107 107  CO2 23  --  25 24 22   --   GLUCOSE 74  --  98 76 95 84  BUN 17  --  19 16 16 11   CALCIUM 8.9  --  8.9 8.9 10.1  --   CREATININE 0.81   < > 0.93 0.78 0.76 0.80  GFRNONAA  --   --  >60  --  >60  --    < > = values in this interval not displayed.    LIVER FUNCTION TESTS: Recent Labs    12/29/20 0939 02/08/21 1622 05/12/21 0000 05/29/21 1119  BILITOT 0.3 0.3 0.3 0.4  AST 32* 11 12 12*  ALT 56* 11 14 26   ALKPHOS  --  57  --  57  PROT 6.8 7.1 6.6 8.4*  ALBUMIN  --  4.5  --  5.2*    TUMOR MARKERS: No results for input(s): "AFPTM", "CEA", "CA199", "CHROMGRNA" in the last 8760 hours.  Assessment and Plan:  36 year-old female with severe chronic post prandial abdominal pain and nausea.  She recently underwent celiac arteriography with Dr. Servando Snare and there was some mild impingement of the celiac artery at full expiration.  Her symptoms seem out of proportion to these changes, but median arcuate ligament syndrome is a possibility.  Prior to her undergoing operative release of the MAL, she is interested in pursuing a celiac plexus block to assess if she experiences  relief.   We discussed that celiac plexus ablation with EtOH is typically a palliative procedure for patients with intractable cancer related pain.  There can be significant adverse effects including orthostatic hypotension and chronic diarrhea.  A more simple celiac plexus block using bupivicaine can provide a temporary blockade for 4-6 hrs during which she can assess herself for symptomatic relief.   She is interested in this approach.   1.) Schedule for celiac plexus block (anesthetic only) at [facility].     Electronically Signed: Antonietta Jewel Dezmond Downie 10/26/2021, 10:22 AM   I spent a total of  15 Minutes in remote  clinical consultation, greater than 50% of which was counseling/coordinating care for abdominal pain, nausea, possible median arcuate ligament compression syndrome.    Visit type: Audio only (telephone). Audio (no video) only due to patient preference. Alternative for in-person consultation at [facility], [place] [address] [place], [place], [place]. This visit type was conducted due to national recommendations for restrictions regarding the COVID-19 Pandemic (e.g. social distancing).  This format is felt to be most appropriate for this patient at this time.  All issues noted in this document were discussed and addressed.

## 2021-10-27 ENCOUNTER — Other Ambulatory Visit (HOSPITAL_COMMUNITY): Payer: Self-pay | Admitting: Interventional Radiology

## 2021-10-27 DIAGNOSIS — I774 Celiac artery compression syndrome: Secondary | ICD-10-CM

## 2021-11-01 ENCOUNTER — Other Ambulatory Visit: Payer: Self-pay | Admitting: Radiology

## 2021-11-01 DIAGNOSIS — R109 Unspecified abdominal pain: Secondary | ICD-10-CM

## 2021-11-02 ENCOUNTER — Ambulatory Visit (HOSPITAL_COMMUNITY): Admission: RE | Admit: 2021-11-02 | Payer: Medicaid Other | Source: Ambulatory Visit

## 2021-11-02 ENCOUNTER — Ambulatory Visit (HOSPITAL_COMMUNITY)
Admission: RE | Admit: 2021-11-02 | Discharge: 2021-11-02 | Disposition: A | Discharge status: Home / Self Care | Payer: Medicaid Other | Source: Ambulatory Visit | Attending: Interventional Radiology | Admitting: Interventional Radiology

## 2021-11-02 ENCOUNTER — Encounter (HOSPITAL_COMMUNITY): Payer: Self-pay

## 2021-11-02 VITALS — BP 112/71 | HR 90 | Temp 98.1°F | Resp 14 | Ht 62.0 in | Wt 165.0 lb

## 2021-11-02 DIAGNOSIS — R52 Pain, unspecified: Secondary | ICD-10-CM

## 2021-11-02 DIAGNOSIS — R109 Unspecified abdominal pain: Secondary | ICD-10-CM | POA: Insufficient documentation

## 2021-11-02 DIAGNOSIS — I774 Celiac artery compression syndrome: Secondary | ICD-10-CM | POA: Diagnosis not present

## 2021-11-02 DIAGNOSIS — Z01812 Encounter for preprocedural laboratory examination: Secondary | ICD-10-CM | POA: Diagnosis not present

## 2021-11-02 LAB — BASIC METABOLIC PANEL
Anion gap: 6 (ref 5–15)
BUN: 20 mg/dL (ref 6–20)
CO2: 21 mmol/L — ABNORMAL LOW (ref 22–32)
Calcium: 8.6 mg/dL — ABNORMAL LOW (ref 8.9–10.3)
Chloride: 113 mmol/L — ABNORMAL HIGH (ref 98–111)
Creatinine, Ser: 0.82 mg/dL (ref 0.44–1.00)
GFR, Estimated: >60 mL/min (ref >60)
Glucose, Bld: 86 mg/dL (ref 70–99)
Potassium: 3.6 mmol/L (ref 3.5–5.1)
Sodium: 140 mmol/L (ref 135–145)

## 2021-11-02 LAB — CBC WITH DIFFERENTIAL/PLATELET
Abs Immature Granulocytes: 0.03 10*3/uL (ref 0.00–0.07)
Basophils Absolute: 0.1 10*3/uL (ref 0.0–0.1)
Basophils Relative: 1 %
Eosinophils Absolute: 0.1 10*3/uL (ref 0.0–0.5)
Eosinophils Relative: 1 %
HCT: 37.8 % (ref 36.0–46.0)
Hemoglobin: 12.2 g/dL (ref 12.0–15.0)
Immature Granulocytes: 0 %
Lymphocytes Relative: 20 %
Lymphs Abs: 1.9 10*3/uL (ref 0.7–4.0)
MCH: 30.5 pg (ref 26.0–34.0)
MCHC: 32.3 g/dL (ref 30.0–36.0)
MCV: 94.5 fL (ref 80.0–100.0)
Monocytes Absolute: 0.9 10*3/uL (ref 0.1–1.0)
Monocytes Relative: 9 %
Neutro Abs: 6.7 10*3/uL (ref 1.7–7.7)
Neutrophils Relative %: 69 %
Platelets: 321 10*3/uL (ref 150–400)
RBC: 4 MIL/uL (ref 3.87–5.11)
RDW: 12.9 % (ref 11.5–15.5)
WBC: 9.7 10*3/uL (ref 4.0–10.5)
nRBC: 0 % (ref 0.0–0.2)

## 2021-11-02 LAB — POCT PREGNANCY, URINE: Preg Test, Ur: NEGATIVE

## 2021-11-02 LAB — PROTIME-INR
INR: 1 (ref 0.8–1.2)
Prothrombin Time: 13.5 seconds (ref 11.4–15.2)

## 2021-11-02 MED ORDER — FENTANYL CITRATE (PF) 100 MCG/2ML IJ SOLN
INTRAMUSCULAR | Status: AC
  Administered 2021-11-02: 50 ug
  Administered 2021-11-02: 50 ug via INTRAVENOUS
  Filled 2021-11-02: qty 6

## 2021-11-02 MED ORDER — MIDAZOLAM HCL 2 MG/2ML IJ SOLN
INTRAMUSCULAR | Status: AC
  Administered 2021-11-02: 1 mg via INTRAVENOUS
  Administered 2021-11-02: 1 mg
  Filled 2021-11-02: qty 6

## 2021-11-02 MED ORDER — METHYLPREDNISOLONE ACETATE 80 MG/ML IJ SUSP
INTRAMUSCULAR | Status: AC
  Filled 2021-11-02: qty 1

## 2021-11-02 MED ORDER — BUPIVACAINE HCL (PF) 0.25 % IJ SOLN
INTRAMUSCULAR | Status: AC
  Filled 2021-11-02: qty 30

## 2021-11-02 MED ORDER — IOHEXOL 300 MG/ML  SOLN
5.0000 mL | Freq: Once | INTRAMUSCULAR | Status: AC | PRN
  Administered 2021-11-02: 5 mL

## 2021-11-02 MED ORDER — SODIUM CHLORIDE 0.9 % IV SOLN: INTRAVENOUS | Status: DC

## 2021-11-02 NOTE — Procedures (Signed)
Interventional Radiology Procedure Note  Procedure: CT guided celiac plexus block  Complications: None  Estimated Blood Loss: None  Recommendations: - Bedrest x 1 hr - Watch orthostatic hypotension   Signed,  Criselda Peaches, MD

## 2021-11-02 NOTE — H&P (Signed)
Chief Complaint: Patient was seen in consultation today for celiac plexus block   Referring Physician(s): Dr. Servando Snare  Supervising Physician: Jacqulynn Cadet  Patient Status: [facility] - Out-pt  History of Present Illness: Charma Mocarski is a 36 y.o. female with a medical history significant for anxiety/depression, Chiari malformation Type I, mild cognitive impairment, May-Thurner syndrome, DVT on Xarelto, migraines and scoliosis. She was initially referred to IR in 2022 by Dr. Oneida Alar to assess for possible pelvic congestion syndrome. Her symptoms included left calf/foot pain, ankle swelling, left back/flank pain and left lower abdominal pain with heaviness, aching and bloating. She was found to have profound reflux of the left ovarian vein and was approved by IR for treatment. Unfortunately her insurance denied treatment. She later changed jobs and sought a second opinion at [facility]. It was there she was diagnosed with both pelvic congestion syndrome and May-Thurner syndrome. She underwent embolization of the left ovarian vein and stenting of the left common and external iliac veins. This was performed November 2022.   Following this procedure she experienced resolution of her symptoms for approximately 4-6 weeks before developing acute onset severe pain. She was found to have a non-occlusive DVT in the common femoral vein and was not treated with anticoagulation at that time. She has recently been evaluated by hematology for a clotting disorder and evaluated by the CHF clinic at [facility] for potential heart-related issues. She also recently underwent Neurosurgical intervention for Chiari Malformation.   More recently she developed post-prandial abdominal pain/nausea and was evaluated by Dr. Donzetta Matters for possible celiac artery dissection and/or median arcuate ligament syndrome. Celiac angiography showed some impingement of the celiac artery and Dr. Donzetta Matters referred the patient to IR to discuss  possible celiac plexus block prior to considering MAL release surgery.   The patient met with Dr. Laurence Ferrari 10/26/21 via tele-health visit to discuss her symptoms and the role a celiac plexus block might play. They discussed performing the block with ETOH which is typically a palliative procedure for patients with intractable cancer-related pain. This method carries significant adverse effects including orthostatic hypotension and chronic diarrhea. They discussed a more simple celiac plexus block using bupivicaine. This method provides a temporary blockade for 4-6 hours and would be a good approach to assess if her pain is alleviated prior to undergoing operative release of the MAL. The patient requested to pursue the temporary block.   Past Medical History:  Diagnosis Date   Anxiety    Anxiety with depression 08/03/2019   Bilateral carpal tunnel syndrome 02/23/2011   Cervical radiculitis 09/17/2013   Chiari malformation type I ([place]) 04/28/2013   Cognitive impairment, mild, so stated 05/23/2014   Degenerative joint disease of cervical and lumbar spine 05/23/2014   Delivery of pregnancy by cesarean section    Depression    Dry cough 08/03/2019   Epidermal inclusion cyst 04/10/2012   Eye pain 04/24/2013   Fever of unknown origin (FUO) 11/05/2014   Fluid retention in legs 06/06/2021   Gastrointestinal symptoms 06/06/2021   Formatting of this note might be different from the original.     Last Assessment & Plan:  Formatting of this note might be different from the original.   Hematuria 12/18/2019   History of C-section 05/12/2014   Formatting of this note might be different from the original. 2006 LTCS for breech & NRFHR   History of DVT (deep vein thrombosis) 07/05/2020   Hypothyroidism    Lumbar radiculopathy 09/17/2013   May-Thurner syndrome 12/13/2020  MDD (major depressive disorder), recurrent episode, mild ([place]) 04/04/2012   Migraine headache without aura 04/10/2012   Myopia of both eyes 04/24/2013    Neck pain 09/17/2013   Neuropathy, median nerve 09/17/2013   Pain in joint involving left lower leg 07/05/2020   Pain of left lower extremity 07/05/2020   Scoliosis 12/18/2019   Visual disturbance 04/24/2013    Past Surgical History:  Procedure Laterality Date   CESAREAN SECTION     IR RADIOLOGIST EVAL & MGMT  08/23/2020   IR RADIOLOGIST EVAL & MGMT  05/30/2021   IR RADIOLOGIST EVAL & MGMT  10/26/2021   VISCERAL ANGIOGRAPHY N/A 10/16/2021   Procedure: MESENTRIC ANGIOGRAPHY;  Surgeon: Waynetta Sandy, MD;  Location: [facility];  Service: Cardiovascular;  Laterality: N/A;    Allergies: Penicillins  Medications: Prior to Admission medications   Medication Sig Start Date End Date Taking? Authorizing Provider  AJOVY 225 MG/1.5ML SOAJ Inject 225 mg into the skin every 30 (thirty) days. 10/03/21   [provider]  ALPRAZolam Duanne Moron) 0.25 MG tablet Take 0.25 mg by mouth 2 (two) times daily as needed for anxiety. 09/24/21   [provider]  amLODipine (NORVASC) 2.5 MG tablet Take 2.5 mg by mouth 2 (two) times daily. 08/09/21   [provider]  buPROPion (WELLBUTRIN XL) 150 MG 24 hr tablet Take 150 mg by mouth in the morning. 07/25/21   [provider]  cyanocobalamin (VITAMIN B12) 1000 MCG/ML injection Inject 1,000 mcg into the muscle every Wednesday.    [provider]  etonogestrel (NEXPLANON) 68 MG IMPL implant Inject 1 each into the skin once.    [provider]  furosemide (LASIX) 20 MG tablet Take 20 mg by mouth in the morning. 08/09/21   [provider]  isosorbide mononitrate (IMDUR) 30 MG 24 hr tablet Take 30 mg by mouth in the morning and at bedtime. 08/09/21   [provider]  levothyroxine (SYNTHROID) 75 MCG tablet TAKE ONE TABLET BY MOUTH DAILY ON MONDAYS, [place], [place], THURSDAYS AND FRIDAYS. TAKE ONE AND ONE-HALF TABLET ON SATURDAYS AND SUNDAYS. Patient taking differently: Take 75 mcg by mouth daily  before breakfast. 06/13/21   Samuel Bouche, NP  linaclotide (LINZESS) 290 MCG CAPS capsule Take 290 mcg by mouth daily.    [provider]  Multiple Vitamin (MULTIVITAMIN ADULT PO) Take 1 tablet by mouth in the morning.    [provider]  NURTEC 75 MG TBDP Take 75 mg by mouth daily as needed for migraine. 08/23/21   [provider]  ondansetron (ZOFRAN-ODT) 8 MG disintegrating tablet TAKE 1 TABLET BY MOUTH EVERY 8 HOURS AS NEEDED FOR NAUSEA 10/19/21   Cirigliano, Vito V, DO  potassium chloride SA (KLOR-CON M) 20 MEQ tablet Take 20 mEq by mouth 2 (two) times daily. 03/23/21   [provider]  spironolactone (ALDACTONE) 25 MG tablet Take 25 mg by mouth in the morning. 04/17/21   [provider]  tiZANidine (ZANAFLEX) 4 MG tablet Take 4 mg by mouth at bedtime. 08/23/21   [provider]  topiramate (TOPAMAX) 200 MG tablet Take 200 mg by mouth 2 (two) times daily. 07/21/19   [provider]  Vilazodone HCl (VIIBRYD) 40 MG TABS Take 1 tablet (40 mg total) by mouth daily. 05/02/21   Samuel Bouche, NP  Vitamin D, Ergocalciferol, (DRISDOL) 1.25 MG (50000 UNIT) CAPS capsule Take 50,000 Units by mouth every Thursday. 08/17/21   [provider]  XARELTO 20 MG TABS  tablet Take 20 mg by mouth in the morning. 08/25/21   [provider]     Family History  Problem Relation Age of Onset   Hypertension Mother    Diabetes Mother    Colon polyps Mother    Hypertension Father    Diabetes Father    Colon polyps Father    Diabetes Maternal Grandmother    Hypertension Maternal Grandmother    Stroke Maternal Grandmother    Diabetes Maternal Grandfather    Hypertension Maternal Grandfather    Stroke Maternal Grandfather    Diabetes Paternal Grandmother    Hypertension Paternal Grandmother    Stroke Paternal Grandmother    Diabetes Paternal Grandfather    Hypertension Paternal Grandfather    Stroke Paternal Grandfather    Colon cancer Neg Hx     Pancreatic cancer Neg Hx    Stomach cancer Neg Hx    Esophageal cancer Neg Hx    Liver cancer Neg Hx     Social History   Socioeconomic History   Marital status: Married    Spouse name: Not on file   Number of children: 2   Years of education: Not on file   Highest education level: Not on file  Occupational History   Not on file  Tobacco Use   Smoking status: Never   Smokeless tobacco: Never  Vaping Use   Vaping Use: Never used  Substance and Sexual Activity   Alcohol use: Not Currently   Drug use: Never   Sexual activity: Yes    Partners: Male    Birth control/protection: Implant  Other Topics Concern   Not on file  Social History Narrative   Not on file   Social Determinants of Health   Financial Resource Strain: Not on file  Food Insecurity: Not on file  Transportation Needs: Not on file  Physical Activity: Not on file  Stress: Not on file  Social Connections: Not on file    Review of Systems: A 12 point ROS discussed and pertinent positives are indicated in the HPI above.  All other systems are negative.  Review of Systems  Constitutional:  Negative for appetite change and fatigue.  Respiratory:  Negative for cough and shortness of breath.   Cardiovascular:  Negative for chest pain and leg swelling.  Gastrointestinal:  Positive for abdominal pain and diarrhea. Negative for nausea and vomiting.  Neurological:  Negative for dizziness and headaches.    Vital Signs: LMP 10/14/2021 (Exact Date)   Physical Exam Constitutional:      General: She is not in acute distress.    Appearance: She is not ill-appearing.  HENT:     Mouth/Throat:     Mouth: Mucous membranes are moist.     Pharynx: Oropharynx is clear.  Cardiovascular:     Rate and Rhythm: Normal rate and regular rhythm.     Pulses: Normal pulses.     Heart sounds: Normal heart sounds.  Pulmonary:     Effort: Pulmonary effort is normal.     Breath sounds: Normal breath sounds.  Abdominal:      General: Bowel sounds are normal.     Palpations: Abdomen is soft.  Musculoskeletal:     Right lower leg: No edema.     Left lower leg: No edema.  Skin:    General: Skin is warm and dry.  Neurological:     Mental Status: She is alert and oriented to person, place, and time.     Imaging: IR Radiologist  Eval & Mgmt  Result Date: 10/26/2021 Please refer to notes tab for details about interventional procedure. (Op Note)  PERIPHERAL VASCULAR CATHETERIZATION  Result Date: 10/16/2021 Images from the original result were not included. Patient name: Safina Huard MRN: [id number] DOB: [birth year] Sex: female 10/16/2021 Pre-operative Diagnosis: Abdominal pain with possible celiac artery stenosis Post-operative diagnosis:  Same Surgeon:  Erlene Quan C. Donzetta Matters, MD Procedure Performed: 1.  Ultrasound-guided cannulation right common femoral artery 2.  Aortogram with expiration and inspiration 3.  Moderate sedation with fentanyl and Versed for 20 minutes 4.  Mynx device closure right common femoral artery Indications: 36 year old female with history of abdominal pain is undergone stenting of May Thurner as well as coil embolization of gonadal vein on the left.  She has a CT scan which had concern for dissection in the celiac artery and ultrasound which demonstrated stenosis.  She now indicated for aortogram with inspiration and expiration. Findings: Aortogram demonstrated no flow-limiting stenosis SMA and celiac filled well.  With full expiration there was some impingement of the celiac artery although distal flow appeared intact. Plan will be for celiac ganglion block and possibly will need median arcuate ligament release if this is therapeutic.  Procedure:  The patient was identified in the holding area and taken to room 8.  The patient was then placed supine on the table and prepped and draped in the usual sterile fashion.  A time out was called.  Ultrasound was used to evaluate the right common femoral artery which  was noted be patent.  The area was anesthetized 1% lidocaine cannulated by functional followed by wire sheath.  And images saved department record.  Concomitantly we administered fentanyl and Versed with moderate sedation and her vital signs were monitored throughout the case.  Bentson wires placed followed by 5 French sheath and Omni catheter placed to the level of T10 and aortogram and AP view was initially performed.  We then performed 2 additional views laterally with full inspiration and expiration.  With the above findings we will plan for possible stellate ganglion block with interventional radiology.  Catheter was removed over wire.  Mynx device was deployed.  She tolerated procedure without any complication. Contrast: 60c Brandon C. Donzetta Matters, MD [facility] Office: [phone] Pager: [phone]   VAS [place] MESENTERIC  Result Date: 10/04/2021 ABDOMINAL VISCERAL Patient Name:  KENDA KLOEHN  Date of Exam:   10/04/2021 Medical Rec #: [id number]       Accession #:    [id number] Date of Birth: 1985/06/18       Patient Gender: F Patient Age:   41 years Exam Location:  [facility] Procedure:      VAS [place] MESENTERIC Referring Phys: [id number] [place] -------------------------------------------------------------------------------- High Risk Factors: No history of smoking. Other Factors: May-Thurner syndrome. Limitations: Air/bowel gas, obesity and Technically difficult exam due to excessive overlying bowel gas. Comparison Study: small dissection flap in her celiac artery with post                   innominate dilatation up to 8 mm but with no evidence of                   occlusion and the superior mesenteric artery and IMA were                   patent Performing Technologist: Wilkie Aye RVT  Examination Guidelines: A complete evaluation includes B-mode imaging, spectral Doppler, color Doppler,  and power Doppler as needed of all accessible portions of  each vessel. Bilateral testing is considered an integral part of a complete examination. Limited examinations for reoccurring indications may be performed as noted.  Duplex Findings: +--------------------+--------+--------+------+--------+ Mesenteric          PSV cm/sEDV cm/sPlaqueComments +--------------------+--------+--------+------+--------+ Celiac Artery Origin  408                          +--------------------+--------+--------+------+--------+ SMA Proximal          152     134                  +--------------------+--------+--------+------+--------+ SMA Mid               150     115                  +--------------------+--------+--------+------+--------+ CHA                   193      71                  +--------------------+--------+--------+------+--------+  Summary: Mesenteric: Normal Superior Mesenteric artery and Inferior Mesenteric artery findings. 70 to 99% stenosis in the celiac artery.  *See table(s) above for measurements and observations.  Diagnosing physician: Servando Snare MD  Electronically signed by Servando Snare MD on 10/04/2021 at 9:36:28 AM.    Final     Labs:  CBC: Recent Labs    12/13/20 0000 04/21/21 1839 05/12/21 0000 05/29/21 1119 10/16/21 1040  WBC 7.2 8.4 7.0 12.1*  --   HGB 12.5 13.0 13.1 14.4 12.9  HCT 36.7 38.1 39.1 43.3 38.0  PLT 342 315 329 398  --     COAGS: Recent Labs    12/29/20 0939 05/12/21 0000  INR 1.0 1.0  APTT  --  30    BMP: Recent Labs    12/13/20 0000 02/13/21 1136 04/21/21 1839 05/12/21 0000 05/29/21 1119 10/16/21 1040  NA 139  --  141 140 137 141  K 3.9  --  3.2* 3.6 3.6 3.4*  CL 108  --  110 107 107 107  CO2 23  --  '25 24 22  ' --   GLUCOSE 74  --  98 76 95 84  BUN 17  --  '19 16 16 11  ' CALCIUM 8.9  --  8.9 8.9 10.1  --   CREATININE 0.81   < > 0.93 0.78 0.76 0.80  GFRNONAA  --   --  >60  --  >60  --    < > = values in this interval not displayed.    LIVER FUNCTION TESTS: Recent Labs     12/29/20 0939 02/08/21 1622 05/12/21 0000 05/29/21 1119  BILITOT 0.3 0.3 0.3 0.4  AST 32* 11 12 12*  ALT 56* '11 14 26  ' ALKPHOS  --  57  --  57  PROT 6.8 7.1 6.6 8.4*  ALBUMIN  --  4.5  --  5.2*    TUMOR MARKERS: No results for input(s): "AFPTM", "CEA", "CA199", "CHROMGRNA" in the last 8760 hours.  Assessment and Plan:  Chronic post-prandial abdominal pain; possible median arcuate ligament compression syndrome: Bonney Leitz, 36 year old female, presents today to the [facility] department for an image-guided celiac plexus block (anesthetic only).   Risks and benefits of this procedure were discussed with the patient and/or patient's family including, but not limited  to bleeding, infection and damage to adjacent structures.   All of the questions were answered and there is agreement to proceed. She has been NPO. She has held Xarelto for the past two days.   Consent signed and in chart.    Thank you for this interesting consult.  I greatly enjoyed meeting Janiene Aarons and look forward to participating in their care.  A copy of this report was sent to the requesting provider on this date.  Electronically Signed: Soyla Dryer, AGACNP-BC [phone] 11/02/2021, 12:19 PM   I spent a total of  30 Minutes   in face to face in clinical consultation, greater than 50% of which was counseling/coordinating care for celiac plexus block.

## 2021-11-02 NOTE — Progress Notes (Addendum)
Tolerates eating a snack and ambulating to bathroom with assistance well, prior to discharge. Provided spouse with work excuse. They verbalize understanding of discharge instructions.

## 2021-11-08 ENCOUNTER — Encounter: Payer: Self-pay | Admitting: Vascular Surgery

## 2021-11-08 ENCOUNTER — Ambulatory Visit (INDEPENDENT_AMBULATORY_CARE_PROVIDER_SITE_OTHER): Payer: Medicaid Other | Admitting: Vascular Surgery

## 2021-11-08 VITALS — BP 120/77 | HR 94 | Temp 97.9°F | Resp 20 | Ht 62.0 in | Wt 168.0 lb

## 2021-11-08 DIAGNOSIS — I774 Celiac artery compression syndrome: Secondary | ICD-10-CM | POA: Diagnosis not present

## 2021-11-08 NOTE — Progress Notes (Signed)
Patient ID: Yesenia Harper, female   DOB: November 09, 1985, 36 y.o.   MRN: [id number]  Reason for Consult: No chief complaint on file.   Referred by Christen Butter, NP  Subjective:     HPI:  Yesenia Harper is a 36 y.o. female has a history of abdominal pain that was in the anterior upper abdomen radiating to her back.  She had been treated for pelvic congestion syndrome as well as [name].  When I first saw her she was complaining of chronic bloating and inability to tolerate eating without significant pain rated out about 7 or 8 out of 10.  She is ultimately followed up with mesenteric duplex which did demonstrate celiac artery stenosis and has now undergone angiography which demonstrated some impingement of the celiac artery with full expiration but there was good distal filling past the proximal stenosis.  By previous CT scanning she does not have any evidence of calcification of the aorta or the visceral branches.  She has now undergone celiac plexus block with short acting agent and she states that she had approximately 6 to 7 hours I was pain-free and she was able to eat without any symptoms although this subsequently recurred the next day.  She is now here for further follow-up and discussion of options.  Past Medical History:  Diagnosis Date   Anxiety    Anxiety with depression 08/03/2019   Bilateral carpal tunnel syndrome 02/23/2011   Cervical radiculitis 09/17/2013   Chiari malformation type I (HCC) 04/28/2013   Cognitive impairment, mild, so stated 05/23/2014   Degenerative joint disease of cervical and lumbar spine 05/23/2014   Delivery of pregnancy by cesarean section    Depression    Dry cough 08/03/2019   Epidermal inclusion cyst 04/10/2012   Eye pain 04/24/2013   Fever of unknown origin (FUO) 11/05/2014   Fluid retention in legs 06/06/2021   Gastrointestinal symptoms 06/06/2021   Formatting of this note might be different from the original.     Last Assessment & Plan:   Formatting of this note might be different from the original.   Hematuria 12/18/2019   History of C-section 05/12/2014   Formatting of this note might be different from the original. 2006 LTCS for breech & NRFHR   History of DVT (deep vein thrombosis) 07/05/2020   Hypothyroidism    Lumbar radiculopathy 09/17/2013   May-Thurner syndrome 12/13/2020   MDD (major depressive disorder), recurrent episode, mild (HCC) 04/04/2012   Migraine headache without aura 04/10/2012   Myopia of both eyes 04/24/2013   Neck pain 09/17/2013   Neuropathy, median nerve 09/17/2013   Pain in joint involving left lower leg 07/05/2020   Pain of left lower extremity 07/05/2020   Scoliosis 12/18/2019   Visual disturbance 04/24/2013   Family History  Problem Relation Age of Onset   Hypertension Mother    Diabetes Mother    Colon polyps Mother    Hypertension Father    Diabetes Father    Colon polyps Father    Diabetes Maternal Grandmother    Hypertension Maternal Grandmother    Stroke Maternal Grandmother    Diabetes Maternal Grandfather    Hypertension Maternal Grandfather    Stroke Maternal Grandfather    Diabetes Paternal Grandmother    Hypertension Paternal Grandmother    Stroke Paternal Grandmother    Diabetes Paternal Grandfather    Hypertension Paternal Grandfather    Stroke Paternal Grandfather    Colon cancer Neg Hx    Pancreatic cancer Neg  Hx    Stomach cancer Neg Hx    Esophageal cancer Neg Hx    Liver cancer Neg Hx    Past Surgical History:  Procedure Laterality Date   CESAREAN SECTION     IR RADIOLOGIST EVAL & MGMT  08/23/2020   IR RADIOLOGIST EVAL & MGMT  05/30/2021   IR RADIOLOGIST EVAL & MGMT  10/26/2021   VISCERAL ANGIOGRAPHY N/A 10/16/2021   Procedure: MESENTRIC ANGIOGRAPHY;  Surgeon: Waynetta Sandy, MD;  Location: [facility];  Service: Cardiovascular;  Laterality: N/A;    Short Social History:  Social History   Tobacco Use   Smoking status: Never   Smokeless tobacco:  Never  Substance Use Topics   Alcohol use: Not Currently    Allergies  Allergen Reactions   Penicillins Itching and Rash    Patient feels itching in mouth.     Current Outpatient Medications  Medication Sig Dispense Refill   AJOVY 225 MG/1.5ML SOAJ Inject 225 mg into the skin every 30 (thirty) days.     ALPRAZolam (XANAX) 0.25 MG tablet Take 0.25 mg by mouth 2 (two) times daily as needed for anxiety.     amLODipine (NORVASC) 2.5 MG tablet Take 2.5 mg by mouth 2 (two) times daily.     buPROPion (WELLBUTRIN XL) 150 MG 24 hr tablet Take 150 mg by mouth in the morning.     cyanocobalamin (VITAMIN B12) 1000 MCG/ML injection Inject 1,000 mcg into the muscle every Wednesday.     etonogestrel (NEXPLANON) 68 MG IMPL implant Inject 1 each into the skin once.     furosemide (LASIX) 20 MG tablet Take 20 mg by mouth in the morning.     isosorbide mononitrate (IMDUR) 30 MG 24 hr tablet Take 30 mg by mouth in the morning and at bedtime.     levothyroxine (SYNTHROID) 75 MCG tablet TAKE ONE TABLET BY MOUTH DAILY ON MONDAYS, TUESDAYS, WEDNESDAYS, THURSDAYS AND FRIDAYS. TAKE ONE AND ONE-HALF TABLET ON SATURDAYS AND SUNDAYS. (Patient taking differently: Take 75 mcg by mouth daily before breakfast.) 102 tablet 1   linaclotide (LINZESS) 290 MCG CAPS capsule Take 290 mcg by mouth daily.     Multiple Vitamin (MULTIVITAMIN ADULT PO) Take 1 tablet by mouth in the morning.     NURTEC 75 MG TBDP Take 75 mg by mouth daily as needed for migraine.     ondansetron (ZOFRAN-ODT) 8 MG disintegrating tablet TAKE 1 TABLET BY MOUTH EVERY 8 HOURS AS NEEDED FOR NAUSEA 60 tablet 0   potassium chloride SA (KLOR-CON M) 20 MEQ tablet Take 20 mEq by mouth 2 (two) times daily.     spironolactone (ALDACTONE) 25 MG tablet Take 25 mg by mouth in the morning.     tiZANidine (ZANAFLEX) 4 MG tablet Take 4 mg by mouth at bedtime.     topiramate (TOPAMAX) 200 MG tablet Take 200 mg by mouth 2 (two) times daily.     Vilazodone HCl (VIIBRYD)  40 MG TABS Take 1 tablet (40 mg total) by mouth daily. 90 tablet 1   Vitamin D, Ergocalciferol, (DRISDOL) 1.25 MG (50000 UNIT) CAPS capsule Take 50,000 Units by mouth every Thursday.     XARELTO 20 MG TABS tablet Take 20 mg by mouth in the morning.     No current facility-administered medications for this visit.    Review of Systems  Constitutional:  Constitutional negative. HENT: HENT negative.  Eyes: Eyes negative.  Respiratory: Respiratory negative.  Cardiovascular: Cardiovascular negative.  GI: Positive  for abdominal pain.  Musculoskeletal: Musculoskeletal negative.  Skin: Skin negative.  Neurological: Neurological negative. Hematologic: Hematologic/lymphatic negative.  Psychiatric: Psychiatric negative.        Objective:  Objective  Vitals:   11/08/21 1415  BP: 120/77  Pulse: 94  Resp: 20  Temp: 97.9 F (36.6 C)  SpO2: 98%     Physical Exam HENT:     Head: Normocephalic.     Nose: Nose normal.  Eyes:     Pupils: Pupils are equal, round, and reactive to light.  Cardiovascular:     Rate and Rhythm: Normal rate.  Pulmonary:     Effort: Pulmonary effort is normal.  Abdominal:     General: Abdomen is flat.     Palpations: Abdomen is soft.  Musculoskeletal:        General: Normal range of motion.     Cervical back: Normal range of motion and neck supple.     Right lower leg: No edema.     Left lower leg: No edema.  Skin:    General: Skin is warm and dry.     Capillary Refill: Capillary refill takes less than 2 seconds.  Neurological:     General: No focal deficit present.     Mental Status: She is alert.  Psychiatric:        Mood and Affect: Mood normal.     Data: Celiac plexus block using CT guidance   One or more of the following dose-optimizing techniques was utilized for this exam: automated exposure control, adjustment of the mA and/or kV according to patient size, and/or use of iterative reconstruction technique.   COMPARISON:  None  Available.   MEDICATIONS: 80 mg Depo-Medrol and 9 mL 0.5% Sensorcaine   ANESTHESIA/SEDATION: 2 mg Versed and 100 mcg fentanyl administered intravenously by radiology nursing.   Total monitored sedation time was 40 minutes. The patient's vital signs and level of consciousness were monitored continuously by radiology nursing under my direct supervision.   FLUOROSCOPY: None.   COMPLICATIONS: None immediate.   TECHNIQUE: Informed written consent was obtained from the patient after a thorough discussion of the procedural risks, benefits and alternatives. All questions were addressed. Maximal Sterile Barrier Technique was utilized including caps, mask, sterile gowns, sterile gloves, sterile drape, hand hygiene and skin antiseptic. A timeout was performed prior to the initiation of the procedure.   CT imaging of the upper abdomen was performed. The origin of the celiac plexus was identified. A suitable skin entry site was selected and marked. Using intermittent CT guidance, a 15 cm 22 gauge Chiba needle was carefully advanced through the anterior abdominal wall and positioned with the needle tip adjacent to the aorta just above the origin of the celiac axis.   Injection of dilute Omnipaque contrast material successfully opacifies the expected region of the celiac plexus. At this time, 80 mg Depo-Medrol and 9 mL 0.5% Sensorcaine were then slowly injected. The needle was removed. Post CT imaging demonstrates probable mild expected hematoma along the needle tract.   FINDINGS: Technically successful celiac plexus block.   IMPRESSION: Technically successful celiac plexus block.     Assessment/Plan:    36 year old female with nonspecific abdominal pain with previous extensive work-up now with evidence of celiac artery compression with expiration on angiography and symptom resolution with celiac plexus block.  I discussed with her the neck steps would be discussion of median arcuate  ligament release with a general surgeon and we have made the referral to a foregut surgeon  at [facility].  Certainly any operative decision making will be up to the provider and I will be happy to answer any questions regarding her previous work-up.  She can otherwise follow-up with me on an as-needed basis.     Maeola Harman MD [facility]

## 2021-11-09 IMAGING — US US EXTREM LOW VENOUS*L*
1 series · 14 of 24 positions shown · non-contrast
Comparison: None.

CLINICAL DATA: Left foot pain and swelling for 2-3 weeks

History of prior DVT
EXAM:
RIGHT LOWER EXTREMITY VENOUS DOPPLER ULTRASOUND
TECHNIQUE: Gray-scale sonography with compression, as well as color and duplex
ultrasound, were performed to evaluate the deep venous system(s)
from the level of the common femoral vein through the popliteal and
proximal calf veins.

[Series 1: us extrem low venous*left* · 0.06mm/px · 14 of 39 slices shown]
[im 1/39]
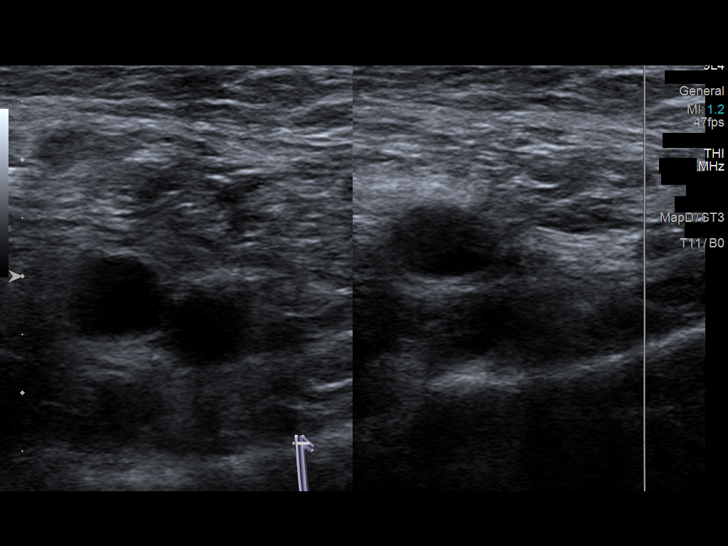
[im 4/39]
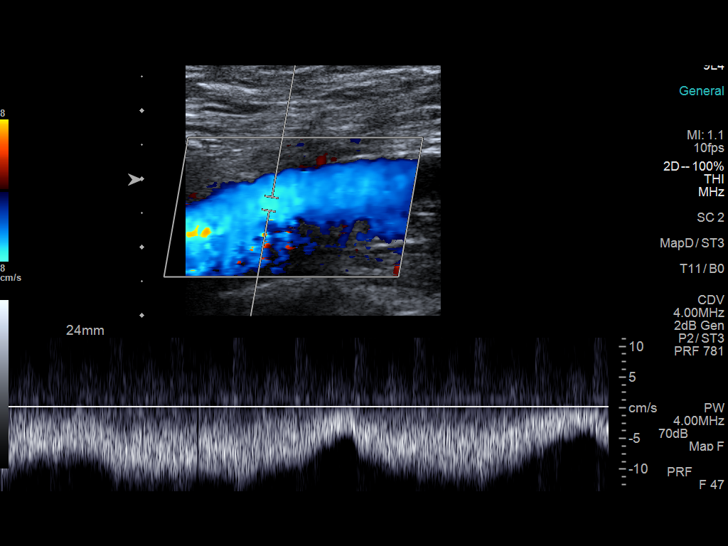
[im 7/39]
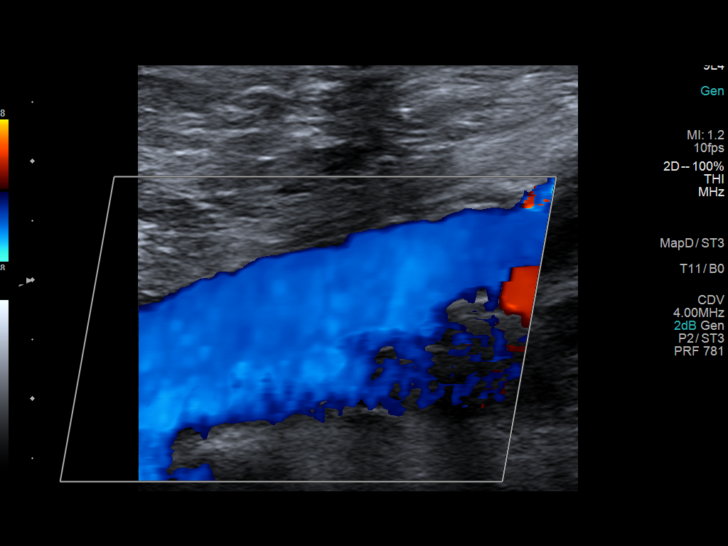
[im 10/39]
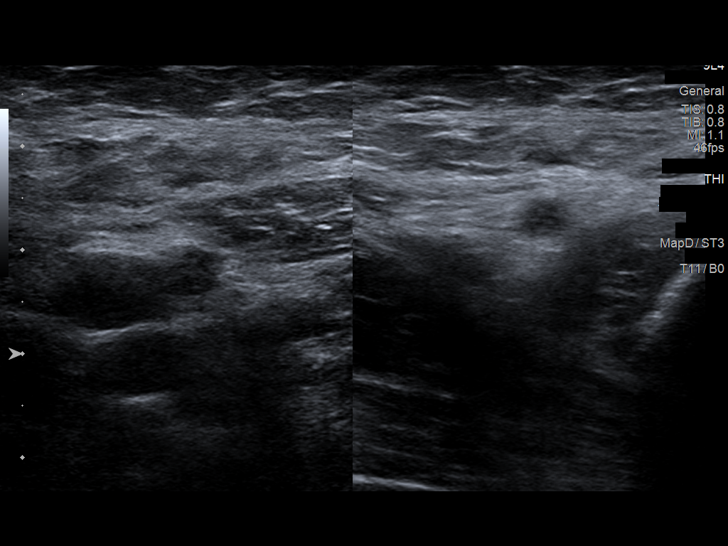
[im 12/39]
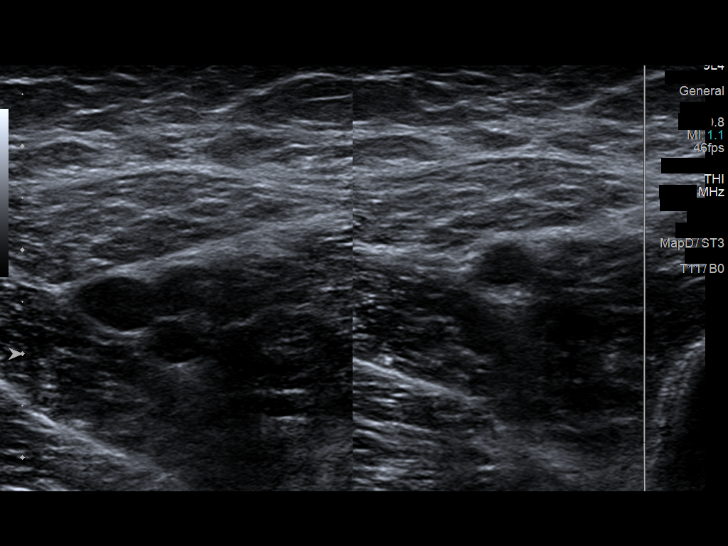
[im 15/39]
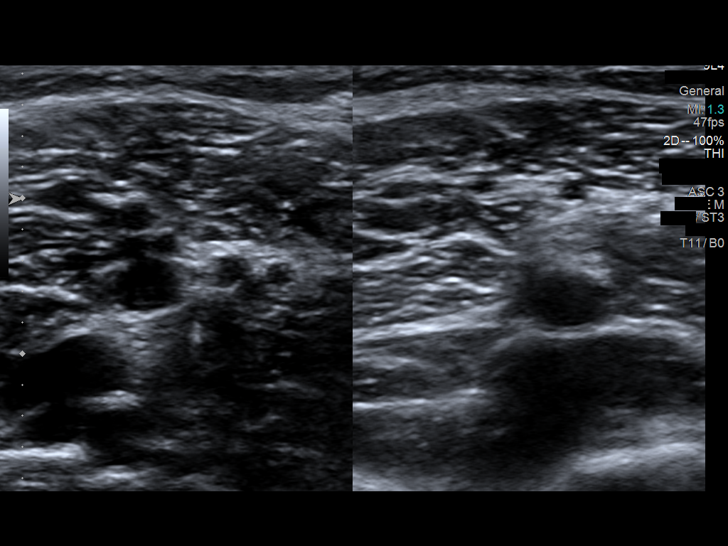
[im 19/39]
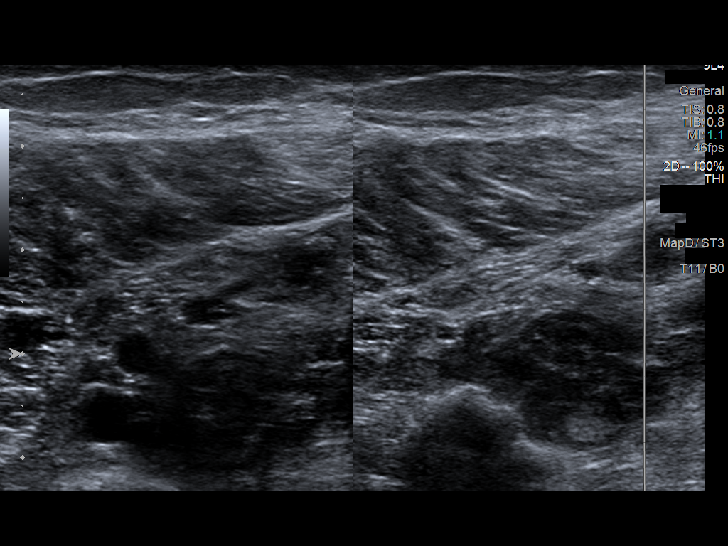
[im 20/39]
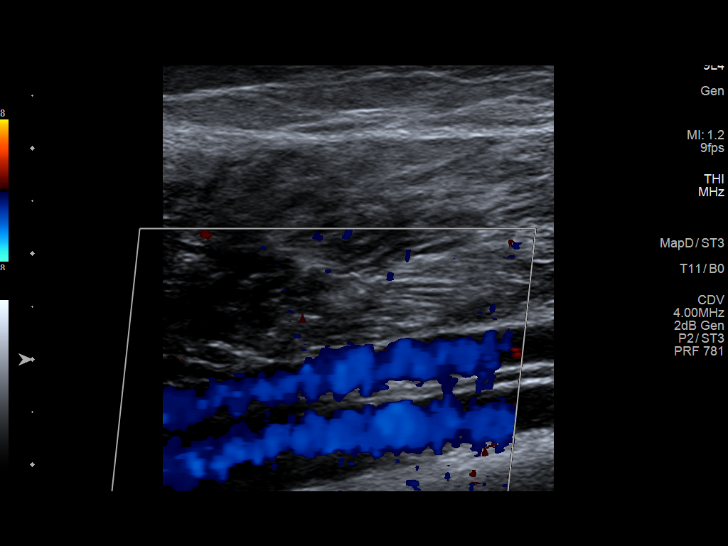
[im 24/39]
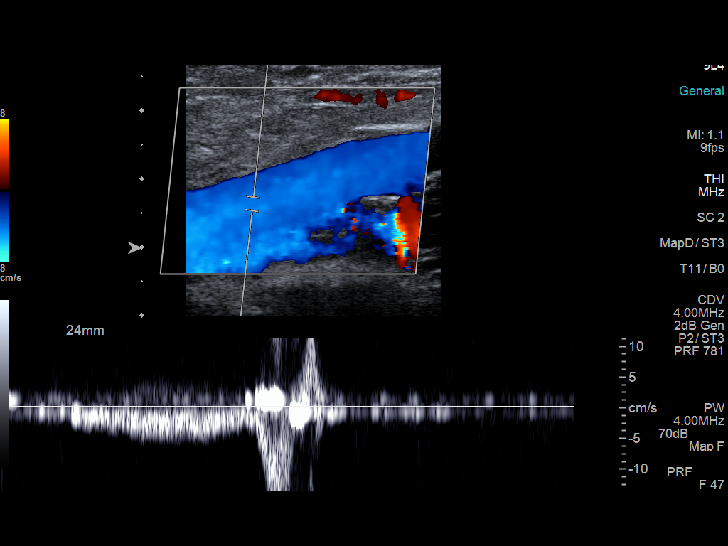
[im 27/39]
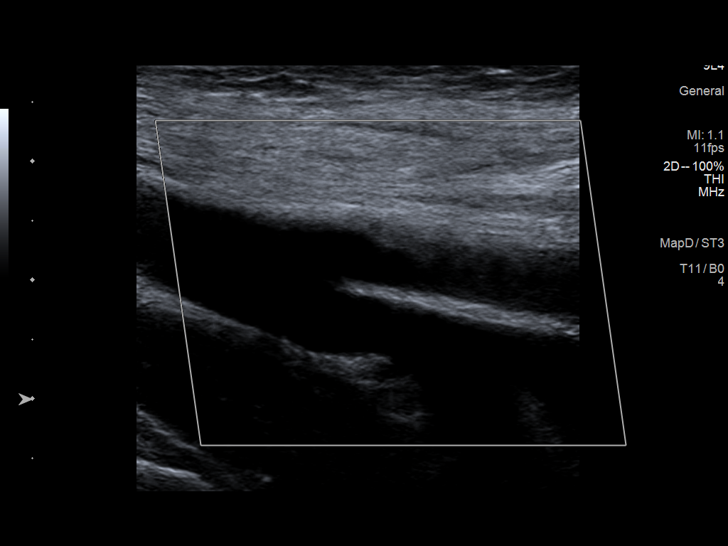
[im 30/39]
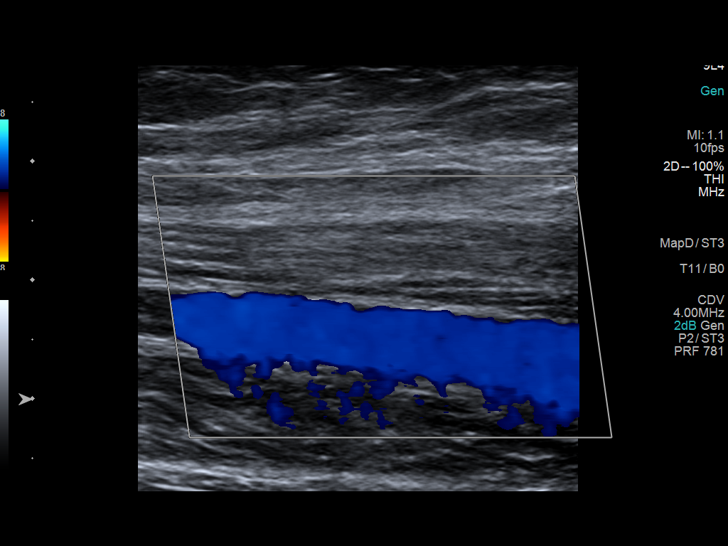
[im 32/39]
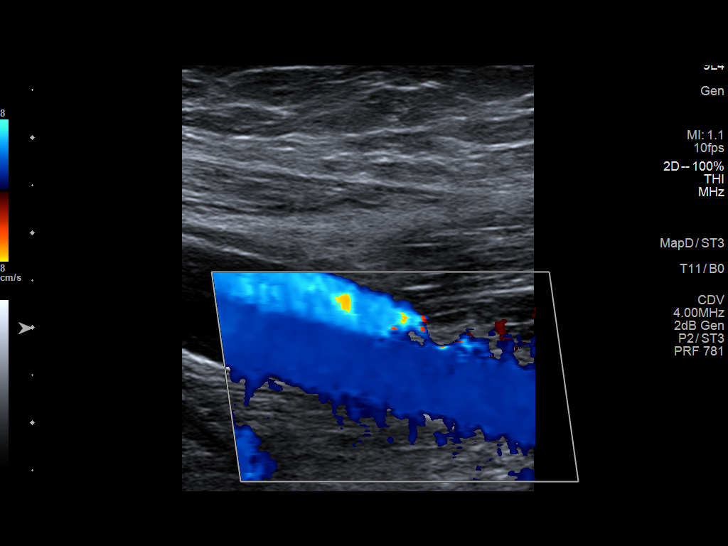
[im 35/39]
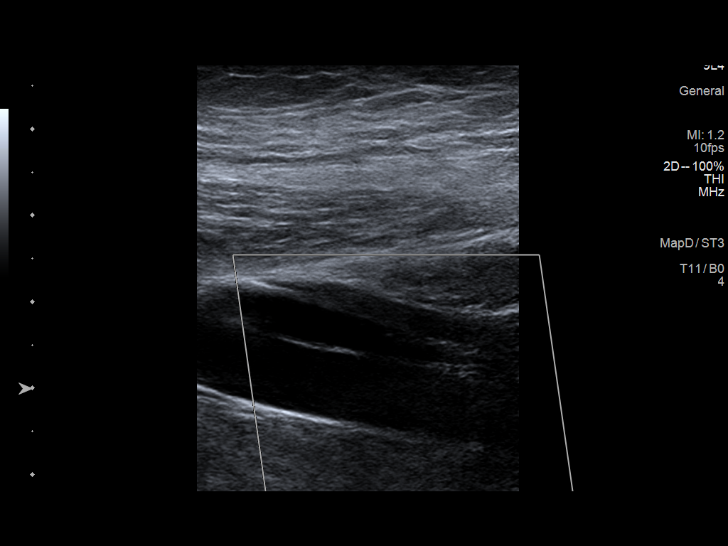
[im 39/39]
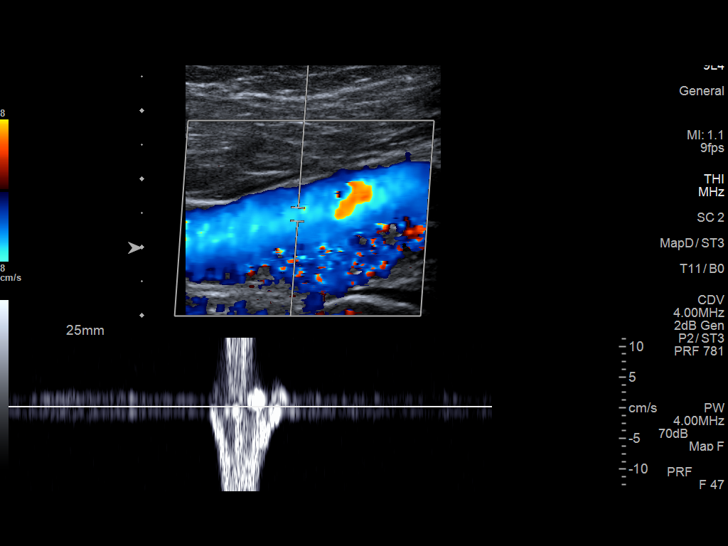

[14 of 24 positions shown; findings below may reference images not displayed]

FINDINGS: VENOUS

Normal compressibility of the common femoral, superficial femoral,
and popliteal veins, as well as the visualized calf veins.
Visualized portions of profunda femoral vein and great saphenous
vein unremarkable. No filling defects to suggest DVT on grayscale or
color Doppler imaging. Doppler waveforms show normal direction of
venous flow, normal respiratory plasticity and response to
augmentation.

Limited views of the contralateral common femoral vein are
unremarkable.

OTHER

None.

Limitations: none
IMPRESSION: Negative.

## 2021-11-15 ENCOUNTER — Encounter: Payer: Self-pay | Admitting: Vascular Surgery

## 2021-11-21 DIAGNOSIS — R0602 Shortness of breath: Secondary | ICD-10-CM | POA: Diagnosis not present

## 2021-11-23 DIAGNOSIS — G43719 Chronic migraine without aura, intractable, without status migrainosus: Secondary | ICD-10-CM | POA: Diagnosis not present

## 2021-11-23 DIAGNOSIS — Z79899 Other long term (current) drug therapy: Secondary | ICD-10-CM | POA: Diagnosis not present

## 2021-11-23 DIAGNOSIS — M542 Cervicalgia: Secondary | ICD-10-CM | POA: Diagnosis not present

## 2021-11-27 DIAGNOSIS — I50813 Acute on chronic right heart failure: Secondary | ICD-10-CM | POA: Diagnosis not present

## 2021-11-27 DIAGNOSIS — G935 Compression of brain: Secondary | ICD-10-CM | POA: Diagnosis not present

## 2021-11-27 DIAGNOSIS — Z79899 Other long term (current) drug therapy: Secondary | ICD-10-CM | POA: Diagnosis not present

## 2021-11-27 DIAGNOSIS — I498 Other specified cardiac arrhythmias: Secondary | ICD-10-CM | POA: Diagnosis not present

## 2021-11-27 DIAGNOSIS — I871 Compression of vein: Secondary | ICD-10-CM | POA: Diagnosis not present

## 2021-11-27 DIAGNOSIS — R071 Chest pain on breathing: Secondary | ICD-10-CM | POA: Diagnosis not present

## 2021-11-27 DIAGNOSIS — R0609 Other forms of dyspnea: Secondary | ICD-10-CM | POA: Diagnosis not present

## 2021-12-03 DIAGNOSIS — R519 Headache, unspecified: Secondary | ICD-10-CM | POA: Diagnosis not present

## 2021-12-04 ENCOUNTER — Telehealth: Payer: Self-pay | Admitting: General Practice

## 2021-12-04 NOTE — Telephone Encounter (Signed)
Transition Care Management Unsuccessful Follow-up Telephone Call  Date of discharge and from where:  12/03/21 from [facility]  Attempts:  1st Attempt  Reason for unsuccessful TCM follow-up call:  Left voice message

## 2021-12-06 NOTE — Telephone Encounter (Signed)
Transition Care Management Unsuccessful Follow-up Telephone Call  Date of discharge and from where:  12/03/21 from [facility]  Attempts:  2nd Attempt  Reason for unsuccessful TCM follow-up call:  Left voice message

## 2021-12-07 ENCOUNTER — Other Ambulatory Visit: Payer: Self-pay | Admitting: Medical-Surgical

## 2021-12-07 NOTE — Telephone Encounter (Signed)
Lvm for patient to call back to make an appointment for a follow up for TSH with Christen Butter. Tvt

## 2021-12-11 NOTE — Telephone Encounter (Signed)
Transition Care Management Unsuccessful Follow-up Telephone Call  Date of discharge and from where:  12/03/21 from [facility]  Attempts:  3rd Attempt  Reason for unsuccessful TCM follow-up call:  Left voice message

## 2021-12-13 DIAGNOSIS — E6609 Other obesity due to excess calories: Secondary | ICD-10-CM | POA: Diagnosis not present

## 2021-12-13 DIAGNOSIS — R1013 Epigastric pain: Secondary | ICD-10-CM | POA: Diagnosis not present

## 2021-12-13 DIAGNOSIS — Z6832 Body mass index (BMI) 32.0-32.9, adult: Secondary | ICD-10-CM | POA: Diagnosis not present

## 2021-12-13 DIAGNOSIS — I774 Celiac artery compression syndrome: Secondary | ICD-10-CM | POA: Diagnosis not present

## 2021-12-14 ENCOUNTER — Telehealth: Payer: Self-pay

## 2021-12-14 NOTE — Telephone Encounter (Signed)
Spoke to pt who called with questions for MD after he referred her to [facility]. She saw them yesterday and has further questions for MD here. I have let MD know and will get back to her once he advises next steps. Pt is aware of this.

## 2021-12-15 ENCOUNTER — Telehealth: Payer: Self-pay

## 2021-12-15 DIAGNOSIS — R310 Gross hematuria: Secondary | ICD-10-CM | POA: Diagnosis not present

## 2021-12-15 DIAGNOSIS — R8289 Other abnormal findings on cytological and histological examination of urine: Secondary | ICD-10-CM | POA: Diagnosis not present

## 2021-12-15 NOTE — Telephone Encounter (Signed)
Returned pt's call regarding scheduling another appt with MD. She had further questions after seeing General Surgery at [facility]. Pt now no longer wants to get scheduled for an appt with [place] and hung up abruptly.

## 2021-12-18 DIAGNOSIS — R935 Abnormal findings on diagnostic imaging of other abdominal regions, including retroperitoneum: Secondary | ICD-10-CM | POA: Diagnosis not present

## 2021-12-18 DIAGNOSIS — R531 Weakness: Secondary | ICD-10-CM | POA: Diagnosis not present

## 2021-12-18 DIAGNOSIS — M545 Low back pain, unspecified: Secondary | ICD-10-CM | POA: Diagnosis not present

## 2021-12-18 DIAGNOSIS — N888 Other specified noninflammatory disorders of cervix uteri: Secondary | ICD-10-CM | POA: Diagnosis not present

## 2021-12-18 DIAGNOSIS — N939 Abnormal uterine and vaginal bleeding, unspecified: Secondary | ICD-10-CM | POA: Diagnosis not present

## 2021-12-18 DIAGNOSIS — R102 Pelvic and perineal pain: Secondary | ICD-10-CM | POA: Diagnosis not present

## 2021-12-18 DIAGNOSIS — R059 Cough, unspecified: Secondary | ICD-10-CM | POA: Diagnosis not present

## 2021-12-18 DIAGNOSIS — R110 Nausea: Secondary | ICD-10-CM | POA: Diagnosis not present

## 2021-12-18 DIAGNOSIS — R1084 Generalized abdominal pain: Secondary | ICD-10-CM | POA: Diagnosis not present

## 2021-12-18 DIAGNOSIS — Z3202 Encounter for pregnancy test, result negative: Secondary | ICD-10-CM | POA: Diagnosis not present

## 2021-12-18 DIAGNOSIS — R310 Gross hematuria: Secondary | ICD-10-CM | POA: Diagnosis not present

## 2021-12-18 DIAGNOSIS — Z7901 Long term (current) use of anticoagulants: Secondary | ICD-10-CM | POA: Diagnosis not present

## 2021-12-20 ENCOUNTER — Telehealth: Payer: Self-pay | Admitting: General Practice

## 2021-12-20 NOTE — Telephone Encounter (Signed)
Transition Care Management Unsuccessful Follow-up Telephone Call  Date of discharge and from where:  12/18/21 from [facility]  Attempts:  1st Attempt  Reason for unsuccessful TCM follow-up call:  No answer/busy

## 2021-12-21 NOTE — Telephone Encounter (Signed)
Transition Care Management Unsuccessful Follow-up Telephone Call  Date of discharge and from where:  12/18/21 from [facility]  Attempts:  2nd Attempt  Reason for unsuccessful TCM follow-up call:  No answer/busy

## 2021-12-22 DIAGNOSIS — Z9889 Other specified postprocedural states: Secondary | ICD-10-CM | POA: Diagnosis not present

## 2021-12-22 DIAGNOSIS — G935 Compression of brain: Secondary | ICD-10-CM | POA: Diagnosis not present

## 2021-12-25 NOTE — Telephone Encounter (Signed)
Transition Care Management Unsuccessful Follow-up Telephone Call  Date of discharge and from where:  12/18/21 from [facility]  Attempts:  3rd Attempt  Reason for unsuccessful TCM follow-up call:  No answer/busy

## 2022-01-03 DIAGNOSIS — I774 Celiac artery compression syndrome: Secondary | ICD-10-CM | POA: Diagnosis not present

## 2022-01-05 DIAGNOSIS — G43711 Chronic migraine without aura, intractable, with status migrainosus: Secondary | ICD-10-CM | POA: Diagnosis not present

## 2022-01-15 DIAGNOSIS — R82998 Other abnormal findings in urine: Secondary | ICD-10-CM | POA: Diagnosis not present

## 2022-01-15 DIAGNOSIS — Z789 Other specified health status: Secondary | ICD-10-CM | POA: Diagnosis not present

## 2022-01-15 DIAGNOSIS — R310 Gross hematuria: Secondary | ICD-10-CM | POA: Diagnosis not present

## 2022-01-18 ENCOUNTER — Other Ambulatory Visit: Payer: Self-pay | Admitting: Medical-Surgical

## 2022-01-24 DIAGNOSIS — K449 Diaphragmatic hernia without obstruction or gangrene: Secondary | ICD-10-CM | POA: Diagnosis not present

## 2022-01-24 DIAGNOSIS — K219 Gastro-esophageal reflux disease without esophagitis: Secondary | ICD-10-CM | POA: Diagnosis not present

## 2022-01-24 DIAGNOSIS — R0609 Other forms of dyspnea: Secondary | ICD-10-CM | POA: Diagnosis not present

## 2022-01-24 DIAGNOSIS — I774 Celiac artery compression syndrome: Secondary | ICD-10-CM | POA: Diagnosis not present

## 2022-01-30 DIAGNOSIS — M549 Dorsalgia, unspecified: Secondary | ICD-10-CM | POA: Diagnosis not present

## 2022-01-30 DIAGNOSIS — M5116 Intervertebral disc disorders with radiculopathy, lumbar region: Secondary | ICD-10-CM | POA: Diagnosis not present

## 2022-01-30 DIAGNOSIS — M24852 Other specific joint derangements of left hip, not elsewhere classified: Secondary | ICD-10-CM | POA: Diagnosis not present

## 2022-01-30 DIAGNOSIS — M5134 Other intervertebral disc degeneration, thoracic region: Secondary | ICD-10-CM | POA: Diagnosis not present

## 2022-01-30 DIAGNOSIS — M25851 Other specified joint disorders, right hip: Secondary | ICD-10-CM | POA: Diagnosis not present

## 2022-01-30 DIAGNOSIS — M5010 Cervical disc disorder with radiculopathy, unspecified cervical region: Secondary | ICD-10-CM | POA: Diagnosis not present

## 2022-02-07 DIAGNOSIS — I774 Celiac artery compression syndrome: Secondary | ICD-10-CM | POA: Diagnosis not present

## 2022-02-09 DIAGNOSIS — Z130 Encounter for screening for diseases of the blood and blood-forming organs and certain disorders involving the immune mechanism: Secondary | ICD-10-CM | POA: Diagnosis not present

## 2022-02-09 DIAGNOSIS — Z1329 Encounter for screening for other suspected endocrine disorder: Secondary | ICD-10-CM | POA: Diagnosis not present

## 2022-02-15 ENCOUNTER — Encounter: Payer: Self-pay | Admitting: Vascular Surgery

## 2022-02-21 DIAGNOSIS — M545 Low back pain, unspecified: Secondary | ICD-10-CM | POA: Diagnosis not present

## 2022-02-21 DIAGNOSIS — G5603 Carpal tunnel syndrome, bilateral upper limbs: Secondary | ICD-10-CM | POA: Diagnosis not present

## 2022-02-21 DIAGNOSIS — G43109 Migraine with aura, not intractable, without status migrainosus: Secondary | ICD-10-CM | POA: Diagnosis not present

## 2022-02-21 DIAGNOSIS — M5412 Radiculopathy, cervical region: Secondary | ICD-10-CM | POA: Diagnosis not present

## 2022-02-21 DIAGNOSIS — M5417 Radiculopathy, lumbosacral region: Secondary | ICD-10-CM | POA: Diagnosis not present

## 2022-02-21 DIAGNOSIS — G4700 Insomnia, unspecified: Secondary | ICD-10-CM | POA: Diagnosis not present

## 2022-02-21 DIAGNOSIS — M542 Cervicalgia: Secondary | ICD-10-CM | POA: Diagnosis not present

## 2022-02-23 DIAGNOSIS — Z7901 Long term (current) use of anticoagulants: Secondary | ICD-10-CM | POA: Diagnosis not present

## 2022-02-23 DIAGNOSIS — M7989 Other specified soft tissue disorders: Secondary | ICD-10-CM | POA: Diagnosis not present

## 2022-02-23 DIAGNOSIS — Z95820 Peripheral vascular angioplasty status with implants and grafts: Secondary | ICD-10-CM | POA: Diagnosis not present

## 2022-02-23 DIAGNOSIS — R600 Localized edema: Secondary | ICD-10-CM | POA: Diagnosis not present

## 2022-02-23 DIAGNOSIS — I871 Compression of vein: Secondary | ICD-10-CM | POA: Diagnosis not present

## 2022-02-28 DIAGNOSIS — I774 Celiac artery compression syndrome: Secondary | ICD-10-CM | POA: Diagnosis not present

## 2022-03-03 ENCOUNTER — Other Ambulatory Visit: Payer: Self-pay | Admitting: Medical-Surgical

## 2022-03-16 DIAGNOSIS — E039 Hypothyroidism, unspecified: Secondary | ICD-10-CM | POA: Diagnosis not present

## 2022-03-16 DIAGNOSIS — M5116 Intervertebral disc disorders with radiculopathy, lumbar region: Secondary | ICD-10-CM | POA: Diagnosis not present

## 2022-03-16 DIAGNOSIS — M4726 Other spondylosis with radiculopathy, lumbar region: Secondary | ICD-10-CM | POA: Diagnosis not present

## 2022-03-16 DIAGNOSIS — F320 Major depressive disorder, single episode, mild: Secondary | ICD-10-CM | POA: Diagnosis not present

## 2022-03-16 DIAGNOSIS — I82409 Acute embolism and thrombosis of unspecified deep veins of unspecified lower extremity: Secondary | ICD-10-CM | POA: Diagnosis not present

## 2022-03-16 DIAGNOSIS — F5101 Primary insomnia: Secondary | ICD-10-CM | POA: Diagnosis not present

## 2022-03-16 DIAGNOSIS — G935 Compression of brain: Secondary | ICD-10-CM | POA: Diagnosis not present

## 2022-03-16 DIAGNOSIS — F419 Anxiety disorder, unspecified: Secondary | ICD-10-CM | POA: Diagnosis not present

## 2022-03-16 DIAGNOSIS — D649 Anemia, unspecified: Secondary | ICD-10-CM | POA: Diagnosis not present

## 2022-03-16 DIAGNOSIS — M48061 Spinal stenosis, lumbar region without neurogenic claudication: Secondary | ICD-10-CM | POA: Diagnosis not present

## 2022-03-16 DIAGNOSIS — M4807 Spinal stenosis, lumbosacral region: Secondary | ICD-10-CM | POA: Diagnosis not present

## 2022-03-16 DIAGNOSIS — G43909 Migraine, unspecified, not intractable, without status migrainosus: Secondary | ICD-10-CM | POA: Diagnosis not present

## 2022-03-19 DIAGNOSIS — M545 Low back pain, unspecified: Secondary | ICD-10-CM | POA: Diagnosis not present

## 2022-03-19 DIAGNOSIS — G894 Chronic pain syndrome: Secondary | ICD-10-CM | POA: Diagnosis not present

## 2022-03-19 DIAGNOSIS — M47816 Spondylosis without myelopathy or radiculopathy, lumbar region: Secondary | ICD-10-CM | POA: Diagnosis not present

## 2022-03-29 DIAGNOSIS — R601 Generalized edema: Secondary | ICD-10-CM | POA: Diagnosis not present

## 2022-03-29 DIAGNOSIS — R319 Hematuria, unspecified: Secondary | ICD-10-CM | POA: Diagnosis not present

## 2022-04-05 DIAGNOSIS — I774 Celiac artery compression syndrome: Secondary | ICD-10-CM | POA: Diagnosis not present

## 2022-04-05 DIAGNOSIS — K449 Diaphragmatic hernia without obstruction or gangrene: Secondary | ICD-10-CM | POA: Diagnosis not present

## 2022-04-05 HISTORY — PX: LAPARASCOPIC MEDICAN ARCUATE LIGAMENT RELEASE: SHX6852

## 2022-04-16 DIAGNOSIS — G8918 Other acute postprocedural pain: Secondary | ICD-10-CM | POA: Diagnosis not present

## 2022-04-16 DIAGNOSIS — Z7901 Long term (current) use of anticoagulants: Secondary | ICD-10-CM | POA: Diagnosis not present

## 2022-04-16 DIAGNOSIS — Z9889 Other specified postprocedural states: Secondary | ICD-10-CM | POA: Diagnosis not present

## 2022-04-16 DIAGNOSIS — R109 Unspecified abdominal pain: Secondary | ICD-10-CM | POA: Diagnosis not present

## 2022-04-16 DIAGNOSIS — R1012 Left upper quadrant pain: Secondary | ICD-10-CM | POA: Diagnosis not present

## 2022-04-16 LAB — BASIC METABOLIC PANEL
BUN: 13 (ref 4–21)
CO2: 26 — AB (ref 13–22)
Chloride: 104 (ref 99–108)
Creatinine: 0.7 (ref 0.5–1.1)
Glucose: 102
Potassium: 3.6 mEq/L (ref 3.5–5.1)
Sodium: 138 (ref 137–147)

## 2022-04-16 LAB — COMPREHENSIVE METABOLIC PANEL
Albumin: 4.2 (ref 3.5–5.0)
eGFR: >90

## 2022-04-16 LAB — HEPATIC FUNCTION PANEL
ALT: 9 U/L (ref 7–35)
AST: 39 — AB (ref 13–35)
Alkaline Phosphatase: 20 — AB (ref 25–125)

## 2022-04-16 LAB — CBC AND DIFFERENTIAL
HCT: 32 — AB (ref 36–46)
Hemoglobin: 11.1 — AB (ref 12.0–16.0)
Platelets: 540 10*3/uL — AB (ref 150–400)
WBC: 10.7

## 2022-04-16 LAB — CBC: RBC: 4.06 (ref 3.87–5.11)

## 2022-04-17 ENCOUNTER — Telehealth: Payer: Self-pay | Admitting: General Practice

## 2022-04-17 DIAGNOSIS — Z9889 Other specified postprocedural states: Secondary | ICD-10-CM | POA: Diagnosis not present

## 2022-04-17 DIAGNOSIS — R109 Unspecified abdominal pain: Secondary | ICD-10-CM | POA: Diagnosis not present

## 2022-04-17 NOTE — Transitions of Care (Post Inpatient/ED Visit) (Signed)
   04/17/2022  Name: Lorel Noll MRN: [id number] DOB: [birth year]  Today's TOC FU Call Status: Today's TOC FU Call Status:: Unsuccessul Call (1st Attempt) Unsuccessful Call (1st Attempt) Date: 04/17/22  Attempted to reach the patient regarding the most recent Inpatient/ED visit.  Follow Up Plan: Additional outreach attempts will be made to reach the patient to complete the Transitions of Care (Post Inpatient/ED visit) call.   Signature Modesto Charon, RN BSN

## 2022-04-23 NOTE — Transitions of Care (Post Inpatient/ED Visit) (Signed)
   04/23/2022  Name: Yesenia Harper MRN: [id number] DOB: 06/02/1985  Today's TOC FU Call Status: Today's TOC FU Call Status:: Unsuccessful Call (2nd Attempt) Unsuccessful Call (1st Attempt) Date: 04/17/22 Unsuccessful Call (2nd Attempt) Date: 04/23/22  Attempted to reach the patient regarding the most recent Inpatient/ED visit.  Follow Up Plan: Additional outreach attempts will be made to reach the patient to complete the Transitions of Care (Post Inpatient/ED visit) call.   Signature Modesto Charon, RN BSN

## 2022-04-24 NOTE — Transitions of Care (Post Inpatient/ED Visit) (Signed)
   04/24/2022  Name: Yesenia Harper MRN: [id number] DOB: 05-27-1985  Today's TOC FU Call Status: Today's TOC FU Call Status:: Unsuccessful Call (3rd Attempt) Unsuccessful Call (1st Attempt) Date: 04/17/22 Unsuccessful Call (2nd Attempt) Date: 04/23/22 Unsuccessful Call (3rd Attempt) Date: 04/24/22  Attempted to reach the patient regarding the most recent Inpatient/ED visit.  Follow Up Plan: No further outreach attempts will be made at this time. We have been unable to contact the patient.  Signature Modesto Charon, RN BSN

## 2022-04-25 DIAGNOSIS — M47816 Spondylosis without myelopathy or radiculopathy, lumbar region: Secondary | ICD-10-CM | POA: Diagnosis not present

## 2022-04-25 DIAGNOSIS — F54 Psychological and behavioral factors associated with disorders or diseases classified elsewhere: Secondary | ICD-10-CM | POA: Diagnosis not present

## 2022-04-25 DIAGNOSIS — E669 Obesity, unspecified: Secondary | ICD-10-CM | POA: Diagnosis not present

## 2022-05-25 ENCOUNTER — Telehealth: Payer: Self-pay | Admitting: *Deleted

## 2022-05-25 NOTE — Telephone Encounter (Signed)
Left patient a message with appointment information for 06/11/2022.

## 2022-06-11 ENCOUNTER — Ambulatory Visit: Payer: Medicaid Other | Admitting: Obstetrics and Gynecology

## 2022-06-13 ENCOUNTER — Encounter: Payer: Self-pay | Admitting: Vascular Surgery

## 2022-06-19 ENCOUNTER — Other Ambulatory Visit: Payer: Self-pay | Admitting: *Deleted

## 2022-06-19 DIAGNOSIS — I739 Peripheral vascular disease, unspecified: Secondary | ICD-10-CM

## 2022-06-22 ENCOUNTER — Ambulatory Visit (HOSPITAL_COMMUNITY)
Admission: RE | Admit: 2022-06-22 | Discharge: 2022-06-22 | Disposition: A | Discharge status: Home / Self Care | Payer: Medicaid Other | Source: Ambulatory Visit | Attending: Vascular Surgery | Admitting: Vascular Surgery

## 2022-06-22 DIAGNOSIS — I739 Peripheral vascular disease, unspecified: Secondary | ICD-10-CM | POA: Insufficient documentation

## 2022-06-22 LAB — VAS US ABI WITH/WO TBI
Left ABI: 1.18
Right ABI: 1.31

## 2022-06-25 DIAGNOSIS — G43719 Chronic migraine without aura, intractable, without status migrainosus: Secondary | ICD-10-CM | POA: Diagnosis not present

## 2022-06-25 DIAGNOSIS — M3507 Sjogren syndrome with central nervous system involvement: Secondary | ICD-10-CM | POA: Diagnosis not present

## 2022-06-25 DIAGNOSIS — G5603 Carpal tunnel syndrome, bilateral upper limbs: Secondary | ICD-10-CM | POA: Diagnosis not present

## 2022-06-25 DIAGNOSIS — F5101 Primary insomnia: Secondary | ICD-10-CM | POA: Diagnosis not present

## 2022-06-25 DIAGNOSIS — R202 Paresthesia of skin: Secondary | ICD-10-CM | POA: Diagnosis not present

## 2022-06-29 ENCOUNTER — Other Ambulatory Visit: Payer: Self-pay

## 2022-06-29 ENCOUNTER — Inpatient Hospital Stay: Payer: Medicaid Other | Attending: Hematology & Oncology

## 2022-06-29 ENCOUNTER — Encounter: Payer: Self-pay | Admitting: Family

## 2022-06-29 ENCOUNTER — Encounter: Payer: Self-pay | Admitting: Medical-Surgical

## 2022-06-29 ENCOUNTER — Inpatient Hospital Stay (HOSPITAL_BASED_OUTPATIENT_CLINIC_OR_DEPARTMENT_OTHER): Payer: Medicaid Other | Admitting: Family

## 2022-06-29 VITALS — BP 120/78 | HR 93 | Temp 98.0°F | Resp 18 | Ht 62.0 in | Wt 197.4 lb

## 2022-06-29 DIAGNOSIS — Z7901 Long term (current) use of anticoagulants: Secondary | ICD-10-CM | POA: Insufficient documentation

## 2022-06-29 DIAGNOSIS — D500 Iron deficiency anemia secondary to blood loss (chronic): Secondary | ICD-10-CM | POA: Diagnosis not present

## 2022-06-29 DIAGNOSIS — I82402 Acute embolism and thrombosis of unspecified deep veins of left lower extremity: Secondary | ICD-10-CM | POA: Insufficient documentation

## 2022-06-29 DIAGNOSIS — D509 Iron deficiency anemia, unspecified: Secondary | ICD-10-CM | POA: Insufficient documentation

## 2022-06-29 DIAGNOSIS — Z86718 Personal history of other venous thrombosis and embolism: Secondary | ICD-10-CM

## 2022-06-29 DIAGNOSIS — I871 Compression of vein: Secondary | ICD-10-CM | POA: Diagnosis not present

## 2022-06-29 LAB — CBC WITH DIFFERENTIAL (CANCER CENTER ONLY)
Abs Immature Granulocytes: 0.06 10*3/uL (ref 0.00–0.07)
Basophils Absolute: 0.1 10*3/uL (ref 0.0–0.1)
Basophils Relative: 1 %
Eosinophils Absolute: 0.2 10*3/uL (ref 0.0–0.5)
Eosinophils Relative: 1 %
HCT: 33.7 % — ABNORMAL LOW (ref 36.0–46.0)
Hemoglobin: 9.9 g/dL — ABNORMAL LOW (ref 12.0–15.0)
Immature Granulocytes: 1 %
Lymphocytes Relative: 23 %
Lymphs Abs: 2.6 10*3/uL (ref 0.7–4.0)
MCH: 24.9 pg — ABNORMAL LOW (ref 26.0–34.0)
MCHC: 29.4 g/dL — ABNORMAL LOW (ref 30.0–36.0)
MCV: 84.7 fL (ref 80.0–100.0)
Monocytes Absolute: 0.9 10*3/uL (ref 0.1–1.0)
Monocytes Relative: 8 %
Neutro Abs: 7.3 10*3/uL (ref 1.7–7.7)
Neutrophils Relative %: 66 %
Platelet Count: 475 10*3/uL — ABNORMAL HIGH (ref 150–400)
RBC: 3.98 MIL/uL (ref 3.87–5.11)
RDW: 19 % — ABNORMAL HIGH (ref 11.5–15.5)
WBC Count: 11 10*3/uL — ABNORMAL HIGH (ref 4.0–10.5)
nRBC: 0 % (ref 0.0–0.2)

## 2022-06-29 LAB — CMP (CANCER CENTER ONLY)
ALT: 15 U/L (ref 0–44)
AST: 13 U/L — ABNORMAL LOW (ref 15–41)
Albumin: 4.3 g/dL (ref 3.5–5.0)
Alkaline Phosphatase: 86 U/L (ref 38–126)
Anion gap: 8 (ref 5–15)
BUN: 11 mg/dL (ref 6–20)
CO2: 24 mmol/L (ref 22–32)
Calcium: 9.5 mg/dL (ref 8.9–10.3)
Chloride: 107 mmol/L (ref 98–111)
Creatinine: 0.7 mg/dL (ref 0.44–1.00)
GFR, Estimated: >60 mL/min (ref >60)
Glucose, Bld: 91 mg/dL (ref 70–99)
Potassium: 4.2 mmol/L (ref 3.5–5.1)
Sodium: 139 mmol/L (ref 135–145)
Total Bilirubin: 0.1 mg/dL — ABNORMAL LOW (ref 0.3–1.2)
Total Protein: 6.9 g/dL (ref 6.5–8.1)

## 2022-06-29 LAB — RETICULOCYTES
Immature Retic Fract: 32.6 % — ABNORMAL HIGH (ref 2.3–15.9)
RBC.: 3.94 MIL/uL (ref 3.87–5.11)
Retic Count, Absolute: 128.4 10*3/uL (ref 19.0–186.0)
Retic Ct Pct: 3.3 % — ABNORMAL HIGH (ref 0.4–3.1)

## 2022-06-29 LAB — SAMPLE TO BLOOD BANK

## 2022-06-29 LAB — FERRITIN: Ferritin: 10 ng/mL — ABNORMAL LOW (ref 11–307)

## 2022-06-29 NOTE — Progress Notes (Signed)
Hematology and Oncology Follow Up Visit  Yesenia Harper [id number] 1985/07/18 37 y.o. 06/29/2022   Principle Diagnosis:  May-Thurner Syndrome Recurrent DVT in the left lower extremity Iron deficiency anemia    Current Therapy:   Xarelto 20 mg PO daily IV iron as indicated   Interim History: Yesenia Harper is here today with her husband and daughter for follow-up.  She is symptomatic with fatigue, weakness, night sweats, chewing lots of ice, SOB with any exertion, bruising, palpitations, chest discomfort and dizziness.  Her cycle is regular with heavy flow and large clots.  She also has long history of hematuria with large clots. Her gums will occasionally bleed as well after brushing.  No petechiae.  No fever, chills, n/v, cough, rash, abdominal pain or changes in bowel or bladder habits.  She feels that she has a lot of fluid retention and notes weight gain over the last year. Weight today is 197 lbs.  Appetite comes and goes. She is doing her best to stay well hydrated.  No falls or syncope reported.   ECOG Performance Status: 1 - Symptomatic but completely ambulatory  Medications:  Allergies as of 06/29/2022       Reactions   Penicillins Itching, Rash   Patient feels itching in mouth.        Medication List        Accurate as of June 29, 2022  2:58 PM. If you have any questions, ask your nurse or doctor.          STOP taking these medications    Ajovy 225 MG/1.5ML Soaj Generic drug: Fremanezumab-vfrm Stopped by: Yesenia Stanford, NP   amLODipine 2.5 MG tablet Commonly known as: NORVASC Stopped by: Yesenia Stanford, NP   isosorbide mononitrate 30 MG 24 hr tablet Commonly known as: IMDUR Stopped by: Yesenia Stanford, NP   potassium chloride SA 20 MEQ tablet Commonly known as: KLOR-CON M Stopped by: Yesenia Stanford, NP   tiZANidine 4 MG tablet Commonly known as: ZANAFLEX Stopped by: Yesenia Stanford, NP       TAKE these medications    acetaminophen 500 MG  tablet Commonly known as: TYLENOL Take by mouth.   albuterol (2.5 MG/3ML) 0.083% nebulizer solution Commonly known as: PROVENTIL INHALE 3 ML BY NEBULIZATION EVERY 6 HOURS AS NEEDED FOR WHEEZING OR SHORTNESS OF BREATH   ALPRAZolam 0.25 MG tablet Commonly known as: XANAX Take 0.25 mg by mouth 2 (two) times daily as needed for anxiety.   buPROPion 300 MG 24 hr tablet Commonly known as: WELLBUTRIN XL Take 300 mg by mouth. What changed: Another medication with the same name was removed. Continue taking this medication, and follow the directions you see here. Changed by: Yesenia Stanford, NP   cyanocobalamin 1000 MCG/ML injection Commonly known as: VITAMIN B12 Inject 1,000 mcg into the muscle every Wednesday.   etonogestrel 68 MG Impl implant Commonly known as: NEXPLANON Inject 1 each into the skin once.   furosemide 20 MG tablet Commonly known as: LASIX Take 20 mg by mouth in the morning.   levothyroxine 75 MCG tablet Commonly known as: SYNTHROID Take one tablet by mouth daily on Mondays, Tuesdays, Wednesdays, Thursdays and Fridays.  Take one and one-half tablet on Saturdays and Sundays. NEEDS APPOINTMENT FOR FURTHER REFILLS.   linaclotide 290 MCG Caps capsule Commonly known as: LINZESS Take 290 mcg by mouth daily.   MULTIVITAMIN ADULT PO Take 1 tablet by mouth in the morning.   Nurtec 75 MG Tbdp Generic drug: Rimegepant Sulfate Take 75  mg by mouth daily as needed for migraine.   ondansetron 8 MG disintegrating tablet Commonly known as: ZOFRAN-ODT TAKE 1 TABLET BY MOUTH EVERY 8 HOURS AS NEEDED FOR NAUSEA   spironolactone 25 MG tablet Commonly known as: ALDACTONE Take 25 mg by mouth in the morning.   topiramate 200 MG tablet Commonly known as: TOPAMAX Take 200 mg by mouth 2 (two) times daily.   Vilazodone HCl 40 MG Tabs Commonly known as: Viibryd Take 1 tablet (40 mg total) by mouth daily.   Vitamin D (Ergocalciferol) 1.25 MG (50000 UNIT) Caps capsule Commonly  known as: DRISDOL Take 50,000 Units by mouth every Thursday.   Xarelto 20 MG Tabs tablet Generic drug: rivaroxaban Take 20 mg by mouth in the morning.        Allergies:  Allergies  Allergen Reactions   Penicillins Itching and Rash    Patient feels itching in mouth.     Past Medical History, Surgical history, Social history, and Family History were reviewed and updated.  Review of Systems: All other 10 point review of systems is negative.   Physical Exam:  height is 5\' 2"  (1.575 m) and weight is 197 lb 6.4 oz (89.5 kg).   Wt Readings from Last 3 Encounters:  06/29/22 197 lb 6.4 oz (89.5 kg)  11/08/21 168 lb (76.2 kg)  11/02/21 165 lb (74.8 kg)    Ocular: Sclerae unicteric, pupils equal, round and reactive to light Ear-nose-throat: Oropharynx clear, dentition fair Lymphatic: No cervical or supraclavicular adenopathy Lungs no rales or rhonchi, good excursion bilaterally Heart regular rate and rhythm, no murmur appreciated Abd soft, nontender, positive bowel sounds MSK no focal spinal tenderness, no joint edema Neuro: non-focal, well-oriented, appropriate affect Breasts: Deferred   Lab Results  Component Value Date   WBC 11.0 (H) 06/29/2022   HGB 9.9 (L) 06/29/2022   HCT 33.7 (L) 06/29/2022   MCV 84.7 06/29/2022   PLT 475 (H) 06/29/2022   Lab Results  Component Value Date   FERRITIN 17 05/29/2021   IRON 181 (H) 05/29/2021   TIBC 396 05/29/2021   UIBC 215 05/29/2021   IRONPCTSAT 46 (H) 05/29/2021   Lab Results  Component Value Date   RETICCTPCT 3.3 (H) 06/29/2022   RBC 3.94 06/29/2022   No results found for: "KPAFRELGTCHN", "LAMBDASER", "KAPLAMBRATIO" No results found for: "IGGSERUM", "IGA", "IGMSERUM" No results found for: "TOTALPROTELP", "ALBUMINELP", "A1GS", "A2GS", "BETS", "BETA2SER", "GAMS", "MSPIKE", "SPEI"   Chemistry      Component Value Date/Time   NA 139 06/29/2022 1403   NA 138 04/16/2022 0000   K 4.2 06/29/2022 1403   CL 107 06/29/2022  1403   CO2 24 06/29/2022 1403   BUN 11 06/29/2022 1403   BUN 13 04/16/2022 0000   CREATININE 0.70 06/29/2022 1403   CREATININE 0.78 05/12/2021 0000   GLU 102 04/16/2022 0000      Component Value Date/Time   CALCIUM 9.5 06/29/2022 1403   ALKPHOS 86 06/29/2022 1403   AST 13 (L) 06/29/2022 1403   ALT 15 06/29/2022 1403   BILITOT 0.1 (L) 06/29/2022 1403       Impression and Plan: Ms. Castro is a very pleasant 37 yo caucasian female with history recurrent left lower extremity DVT and [name].  Hyper coag panel was negative.  She now appears to have IDA.  We will get her set up for 2 doses of IV iron and follow-up in 3 months.    Yesenia Stanford, NP 6/21/20242:58 PM

## 2022-07-02 ENCOUNTER — Encounter: Payer: Self-pay | Admitting: Family

## 2022-07-02 ENCOUNTER — Encounter: Payer: Self-pay | Admitting: *Deleted

## 2022-07-02 LAB — IRON AND IRON BINDING CAPACITY (CC-WL,HP ONLY)
Iron: 165 ug/dL (ref 28–170)
Saturation Ratios: 41 % — ABNORMAL HIGH (ref 10.4–31.8)
TIBC: 406 ug/dL (ref 250–450)
UIBC: 241 ug/dL (ref 148–442)

## 2022-07-03 NOTE — Progress Notes (Unsigned)
   ANNUAL EXAM Patient name: Yesenia Harper MRN [id number]  Date of birth: 02/23/1985 Chief Complaint:   No chief complaint on file.  History of Present Illness:   Yesenia Harper is a 37 y.o. G59P2002 female being seen today for a routine annual exam.   Current complaints: Heavy periods causing IDA and associated symptoms in setting of Xarelto for May-Thurner Syndrome and h/o VTE. She has seen hem/onc recently and has IV iron planned. She had a normal pelvic US in 12/2021. She would like E/P levels checked.  She has a Nexplanon which was last replaced in 2023 with Dr. Jolayne Panther.  ***  Current birth control: Nexplanon  No LMP recorded.  Last Pap/Pap History: No history of abnormal per pt.    Health Maintenance Due  Topic Date Due   Hepatitis C Screening  Never done   COVID-19 Vaccine (5 - 2023-24 season) 09/08/2021    Review of Systems:   Pertinent items are noted in HPI Denies any headaches, blurred vision, fatigue, shortness of breath, chest pain, abdominal pain, abnormal vaginal discharge/itching/odor/irritation, problems with periods, bowel movements, urination, or intercourse unless otherwise stated above. *** Pertinent History Reviewed:  Reviewed past medical,surgical, social and family history.  Reviewed problem list, medications and allergies. Physical Assessment:  There were no vitals filed for this visit.There is no height or weight on file to calculate BMI.   Physical Examination:  General appearance - well appearing, and in no distress Mental status - alert, oriented to person, place, and time Psych:  She has a normal mood and affect Skin - warm and dry, normal color, no suspicious lesions noted Chest - effort normal Heart - normal rate  Breasts - breasts appear normal, no suspicious masses, no skin or nipple changes or axillary nodes Abdomen - soft, nontender, nondistended, no masses or organomegaly Pelvic -  VULVA: normal appearing vulva with no masses, tenderness  or lesions  VAGINA: normal appearing vagina with normal color and discharge, no lesions  CERVIX: normal appearing cervix without discharge or lesions, no CMT UTERUS: uterus is felt to be normal size, shape, consistency and nontender  ADNEXA: No adnexal masses or tenderness noted. Extremities:  No swelling or varicosities noted  Chaperone present for exam  No results found for this or any previous visit (from the past 24 hour(s)).  Assessment & Plan:  Diagnoses and all orders for this visit:  Menorrhagia with regular cycle  Encounter for annual routine gynecological examination  - Cervical cancer screening: Discussed guidelines. Pap with HPV done - Gardasil: {Blank single:19197::"***","has not yet had. Will provide information","completed","has not yet had. Counseling provided and she declines","Has not yet had. Counseling provided and pt accepts"} - GC/CT: {Blank single:19197::"accepts","declines","not indicated"} - Birth Control: Nexplanon - Breast Health: Encouraged self breast awareness/SBE. Teaching provided.  - F/U 12 months and prn     No orders of the defined types were placed in this encounter.   Meds: No orders of the defined types were placed in this encounter.   Follow-up: No follow-ups on file.  Milas Hock, MD 07/03/2022 4:06 PM

## 2022-07-04 ENCOUNTER — Encounter: Payer: Self-pay | Admitting: Vascular Surgery

## 2022-07-04 ENCOUNTER — Other Ambulatory Visit: Payer: Self-pay | Admitting: Gastroenterology

## 2022-07-04 ENCOUNTER — Ambulatory Visit (INDEPENDENT_AMBULATORY_CARE_PROVIDER_SITE_OTHER): Payer: Medicaid Other | Admitting: Vascular Surgery

## 2022-07-04 ENCOUNTER — Encounter: Payer: Self-pay | Admitting: Family

## 2022-07-04 VITALS — BP 124/82 | HR 101 | Temp 98.2°F | Resp 20 | Ht 62.0 in | Wt 195.0 lb

## 2022-07-04 DIAGNOSIS — R110 Nausea: Secondary | ICD-10-CM

## 2022-07-04 DIAGNOSIS — M7989 Other specified soft tissue disorders: Secondary | ICD-10-CM

## 2022-07-04 DIAGNOSIS — M545 Low back pain, unspecified: Secondary | ICD-10-CM | POA: Diagnosis not present

## 2022-07-04 DIAGNOSIS — G894 Chronic pain syndrome: Secondary | ICD-10-CM | POA: Diagnosis not present

## 2022-07-04 DIAGNOSIS — M47816 Spondylosis without myelopathy or radiculopathy, lumbar region: Secondary | ICD-10-CM | POA: Diagnosis not present

## 2022-07-04 NOTE — Progress Notes (Signed)
Patient ID: Yesenia Harper, female   DOB: 18-Sep-1985, 37 y.o.   MRN: [id number]  Reason for Consult: Follow-up   Referred by Christen Butter, NP  Subjective:     HPI:  Yesenia Harper is a 37 y.o. female history of treatment of [name] with stenting treated [facility].  I have evaluated her for celiac artery compression and she ultimately underwent release at [facility] also.  She now follows up for left lower extremity swelling with minimal right lower extremity swelling that has been progressing for the past couple weeks.  She has pictures that demonstrate significant swelling that worsens throughout the day.  She has worn compression stockings but states that they are minimally helpful.  She does not have any skin changes or ulceration.  Past Medical History:  Diagnosis Date   Anxiety    Anxiety with depression 08/03/2019   Bilateral carpal tunnel syndrome 02/23/2011   Cervical radiculitis 09/17/2013   Chiari malformation type I (HCC) 04/28/2013   Cognitive impairment, mild, so stated 05/23/2014   Degenerative joint disease of cervical and lumbar spine 05/23/2014   Delivery of pregnancy by cesarean section    Depression    Dry cough 08/03/2019   Epidermal inclusion cyst 04/10/2012   Eye pain 04/24/2013   Fever of unknown origin (FUO) 11/05/2014   Fluid retention in legs 06/06/2021   Gastrointestinal symptoms 06/06/2021   Formatting of this note might be different from the original.     Last Assessment & Plan:  Formatting of this note might be different from the original.   Hematuria 12/18/2019   History of C-section 05/12/2014   Formatting of this note might be different from the original. 2006 LTCS for breech & NRFHR   History of DVT (deep vein thrombosis) 07/05/2020   Hypothyroidism    Lumbar radiculopathy 09/17/2013   May-Thurner syndrome 12/13/2020   MDD (major depressive disorder), recurrent episode, mild (HCC) 04/04/2012   Migraine headache without aura 04/10/2012    Myopia of both eyes 04/24/2013   Neck pain 09/17/2013   Neuropathy, median nerve 09/17/2013   Pain in joint involving left lower leg 07/05/2020   Pain of left lower extremity 07/05/2020   Scoliosis 12/18/2019   Visual disturbance 04/24/2013   Family History  Problem Relation Age of Onset   Hypertension Mother    Diabetes Mother    Colon polyps Mother    Hypertension Father    Diabetes Father    Colon polyps Father    Diabetes Maternal Grandmother    Hypertension Maternal Grandmother    Stroke Maternal Grandmother    Diabetes Maternal Grandfather    Hypertension Maternal Grandfather    Stroke Maternal Grandfather    Diabetes Paternal Grandmother    Hypertension Paternal Grandmother    Stroke Paternal Grandmother    Diabetes Paternal Grandfather    Hypertension Paternal Grandfather    Stroke Paternal Grandfather    Colon cancer Neg Hx    Pancreatic cancer Neg Hx    Stomach cancer Neg Hx    Esophageal cancer Neg Hx    Liver cancer Neg Hx    Past Surgical History:  Procedure Laterality Date   CESAREAN SECTION     IR RADIOLOGIST EVAL & MGMT  08/23/2020   IR RADIOLOGIST EVAL & MGMT  05/30/2021   IR RADIOLOGIST EVAL & MGMT  10/26/2021   VISCERAL ANGIOGRAPHY N/A 10/16/2021   Procedure: MESENTRIC ANGIOGRAPHY;  Surgeon: Maeola Harman, MD;  Location: [facility];  Service: Cardiovascular;  Laterality: N/A;    Short Social History:  Social History   Tobacco Use   Smoking status: Never   Smokeless tobacco: Never  Substance Use Topics   Alcohol use: Not Currently    Allergies  Allergen Reactions   Penicillins Itching and Rash    Patient feels itching in mouth.     Current Outpatient Medications  Medication Sig Dispense Refill   acetaminophen (TYLENOL) 500 MG tablet Take by mouth.     albuterol (PROVENTIL) (2.5 MG/3ML) 0.083% nebulizer solution INHALE 3 ML BY NEBULIZATION EVERY 6 HOURS AS NEEDED FOR WHEEZING OR SHORTNESS OF BREATH     ALPRAZolam (XANAX)  0.25 MG tablet Take 0.25 mg by mouth 2 (two) times daily as needed for anxiety.     buPROPion (WELLBUTRIN XL) 300 MG 24 hr tablet Take 300 mg by mouth.     cyanocobalamin (VITAMIN B12) 1000 MCG/ML injection Inject 1,000 mcg into the muscle every Wednesday.     etonogestrel (NEXPLANON) 68 MG IMPL implant Inject 1 each into the skin once.     furosemide (LASIX) 20 MG tablet Take 20 mg by mouth in the morning.     levothyroxine (SYNTHROID) 75 MCG tablet Take one tablet by mouth daily on Mondays, Tuesdays, Wednesdays, Thursdays and Fridays.  Take one and one-half tablet on Saturdays and Sundays. NEEDS APPOINTMENT FOR FURTHER REFILLS. 102 tablet 0   linaclotide (LINZESS) 290 MCG CAPS capsule Take 290 mcg by mouth daily.     Multiple Vitamin (MULTIVITAMIN ADULT PO) Take 1 tablet by mouth in the morning.     NURTEC 75 MG TBDP Take 75 mg by mouth daily as needed for migraine.     ondansetron (ZOFRAN-ODT) 8 MG disintegrating tablet TAKE 1 TABLET BY MOUTH EVERY 8 HOURS AS NEEDED FOR NAUSEA 60 tablet 0   QULIPTA 60 MG TABS Take 60 mg by mouth daily.     spironolactone (ALDACTONE) 25 MG tablet Take 25 mg by mouth in the morning.     traZODone (DESYREL) 100 MG tablet Take 100 mg by mouth at bedtime.     Vilazodone HCl (VIIBRYD) 40 MG TABS Take 1 tablet (40 mg total) by mouth daily. 90 tablet 1   Vitamin D, Ergocalciferol, (DRISDOL) 1.25 MG (50000 UNIT) CAPS capsule Take 50,000 Units by mouth every Thursday.     XARELTO 20 MG TABS tablet Take 20 mg by mouth in the morning.     zolpidem (AMBIEN) 10 MG tablet Take 10 mg by mouth at bedtime as needed.     No current facility-administered medications for this visit.    Review of Systems  Constitutional: Positive for unexpected weight change.  HENT: HENT negative.  Eyes: Eyes negative.  Respiratory: Respiratory negative.  Cardiovascular: Positive for leg swelling.  GI: Positive for abdominal pain.  Musculoskeletal: Musculoskeletal negative.  Skin: Skin  negative.  Neurological: Neurological negative. Hematologic: Hematologic/lymphatic negative.  Psychiatric: Psychiatric negative.        Objective:  Objective   Vitals:   07/04/22 1417  BP: 124/82  Pulse: (!) 101  Resp: 20  Temp: 98.2 F (36.8 C)  SpO2: 97%  Weight: 195 lb (88.5 kg)  Height: 5\' 2"  (1.575 m)   Body mass index is 35.67 kg/m.  Physical Exam HENT:     Head: Normocephalic.     Nose: Nose normal.  Eyes:     Pupils: Pupils are equal, round, and reactive to light.  Cardiovascular:     Rate and Rhythm:  Normal rate.  Pulmonary:     Effort: Pulmonary effort is normal.  Abdominal:     General: Abdomen is flat.     Palpations: Abdomen is soft.  Musculoskeletal:     Cervical back: Normal range of motion and neck supple.     Comments: Trace bilateral ankle edema  Skin:    Capillary Refill: Capillary refill takes less than 2 seconds.  Neurological:     General: No focal deficit present.     Mental Status: She is alert.  Psychiatric:        Mood and Affect: Mood normal.     Data: ABI Findings:  +---------+------------------+-----+---------+--------+  Right   Rt Pressure (mmHg)IndexWaveform Comment   +---------+------------------+-----+---------+--------+  Brachial 114                                       +---------+------------------+-----+---------+--------+  PTA     164               1.31 triphasic          +---------+------------------+-----+---------+--------+  DP      152               1.22 triphasic          +---------+------------------+-----+---------+--------+  Great Toe132               1.06 Normal             +---------+------------------+-----+---------+--------+   +---------+------------------+-----+---------+-------+  Left    Lt Pressure (mmHg)IndexWaveform Comment  +---------+------------------+-----+---------+-------+  Brachial 125                                       +---------+------------------+-----+---------+-------+  PTA     145               1.16 triphasic         +---------+------------------+-----+---------+-------+  DP      147               1.18 triphasic         +---------+------------------+-----+---------+-------+  Great Toe129               1.03 Normal            +---------+------------------+-----+---------+-------+   +-------+-----------+-----------+------------+------------+  ABI/TBIToday's ABIToday's TBIPrevious ABIPrevious TBI  +-------+-----------+-----------+------------+------------+  Right 1.31       1.06       1.12        1.10          +-------+-----------+-----------+------------+------------+  Left  1.18       1.03       1.13        0.68          +-------+-----------+-----------+------------+------------+         Bilateral ABIs appear essentially unchanged. Left TBIs appear increased.    Summary:  Right: Resting right ankle-brachial index is within normal range. The  right toe-brachial index is normal.   Left: Resting left ankle-brachial index is within normal range. The left  toe-brachial index is normal.       Assessment/Plan:    36 year old female with history of treatment of [name] now with left greater than right lower extremity swelling.  She is maintained on anticoagulation due to underlying hypercoagulable state.  Given the underlying stenting I  think it is prudent that we evaluate for patency of the stent and also venous reflux although in the past she did not have significant reflux or enlarged greater saphenous vein on the left side.  If all this testing is negative unlikely related to underlying venous disease possibly due to underlying or cardiac insufficiency this was discussed with family demonstrate understanding.     Maeola Harman MD [facility]

## 2022-07-05 ENCOUNTER — Encounter: Payer: Self-pay | Admitting: Obstetrics and Gynecology

## 2022-07-05 ENCOUNTER — Ambulatory Visit: Payer: Medicaid Other | Admitting: Obstetrics and Gynecology

## 2022-07-05 ENCOUNTER — Other Ambulatory Visit (HOSPITAL_COMMUNITY)
Admission: RE | Admit: 2022-07-05 | Discharge: 2022-07-05 | Disposition: A | Discharge status: Home / Self Care | Payer: Medicaid Other | Source: Ambulatory Visit | Attending: Obstetrics and Gynecology | Admitting: Obstetrics and Gynecology

## 2022-07-05 VITALS — BP 121/80 | HR 91 | Resp 16 | Ht 62.0 in | Wt 194.0 lb

## 2022-07-05 DIAGNOSIS — Z30430 Encounter for insertion of intrauterine contraceptive device: Secondary | ICD-10-CM | POA: Diagnosis not present

## 2022-07-05 DIAGNOSIS — Z01419 Encounter for gynecological examination (general) (routine) without abnormal findings: Secondary | ICD-10-CM | POA: Diagnosis not present

## 2022-07-05 DIAGNOSIS — Z23 Encounter for immunization: Secondary | ICD-10-CM | POA: Diagnosis not present

## 2022-07-05 DIAGNOSIS — N920 Excessive and frequent menstruation with regular cycle: Secondary | ICD-10-CM

## 2022-07-05 MED ORDER — LEVONORGESTREL 20 MCG/DAY IU IUD
1.0000 | INTRAUTERINE_SYSTEM | Freq: Once | INTRAUTERINE | Status: AC
Start: 2022-07-05 — End: 2022-07-05
  Administered 2022-07-05: 1 via INTRAUTERINE

## 2022-07-07 ENCOUNTER — Encounter: Payer: Self-pay | Admitting: Obstetrics and Gynecology

## 2022-07-07 DIAGNOSIS — N920 Excessive and frequent menstruation with regular cycle: Secondary | ICD-10-CM

## 2022-07-09 ENCOUNTER — Encounter: Payer: Self-pay | Admitting: Family

## 2022-07-09 ENCOUNTER — Other Ambulatory Visit: Payer: Self-pay

## 2022-07-09 ENCOUNTER — Other Ambulatory Visit: Payer: Self-pay | Admitting: *Deleted

## 2022-07-09 ENCOUNTER — Inpatient Hospital Stay: Payer: Medicaid Other | Attending: Family

## 2022-07-09 VITALS — BP 114/68 | HR 84 | Temp 98.7°F | Resp 20

## 2022-07-09 DIAGNOSIS — N920 Excessive and frequent menstruation with regular cycle: Secondary | ICD-10-CM

## 2022-07-09 DIAGNOSIS — D509 Iron deficiency anemia, unspecified: Secondary | ICD-10-CM | POA: Insufficient documentation

## 2022-07-09 DIAGNOSIS — R21 Rash and other nonspecific skin eruption: Secondary | ICD-10-CM

## 2022-07-09 DIAGNOSIS — D500 Iron deficiency anemia secondary to blood loss (chronic): Secondary | ICD-10-CM

## 2022-07-09 MED ORDER — SODIUM CHLORIDE 0.9 % IV SOLN: Freq: Once | INTRAVENOUS | Status: AC

## 2022-07-09 MED ORDER — SODIUM CHLORIDE 0.9 % IV SOLN
510.0000 mg | Freq: Once | INTRAVENOUS | Status: AC
  Administered 2022-07-09: 510 mg via INTRAVENOUS
  Filled 2022-07-09: qty 17

## 2022-07-09 NOTE — Patient Instructions (Signed)

## 2022-07-10 LAB — CYTOLOGY - PAP
Adequacy: ABSENT
Chlamydia: NEGATIVE
Comment: NEGATIVE
Comment: NEGATIVE
Comment: NORMAL
Diagnosis: NEGATIVE
High risk HPV: NEGATIVE
Neisseria Gonorrhea: NEGATIVE

## 2022-07-11 DIAGNOSIS — G5603 Carpal tunnel syndrome, bilateral upper limbs: Secondary | ICD-10-CM | POA: Diagnosis not present

## 2022-07-11 DIAGNOSIS — G43719 Chronic migraine without aura, intractable, without status migrainosus: Secondary | ICD-10-CM | POA: Diagnosis not present

## 2022-07-11 LAB — PROGESTERONE: Progesterone: 0.1 ng/mL

## 2022-07-11 LAB — ESTRADIOL: Estradiol: 80.5 pg/mL

## 2022-07-16 ENCOUNTER — Inpatient Hospital Stay: Payer: Medicaid Other

## 2022-07-16 VITALS — BP 111/72 | HR 83 | Temp 98.9°F | Resp 17

## 2022-07-16 DIAGNOSIS — D500 Iron deficiency anemia secondary to blood loss (chronic): Secondary | ICD-10-CM

## 2022-07-16 DIAGNOSIS — D509 Iron deficiency anemia, unspecified: Secondary | ICD-10-CM | POA: Diagnosis not present

## 2022-07-16 MED ORDER — SODIUM CHLORIDE 0.9 % IV SOLN
510.0000 mg | Freq: Once | INTRAVENOUS | Status: AC
  Administered 2022-07-16: 510 mg via INTRAVENOUS
  Filled 2022-07-16: qty 17

## 2022-07-16 MED ORDER — SODIUM CHLORIDE 0.9 % IV SOLN: Freq: Once | INTRAVENOUS | Status: AC

## 2022-07-16 NOTE — Patient Instructions (Signed)

## 2022-07-17 ENCOUNTER — Telehealth: Payer: Self-pay | Admitting: *Deleted

## 2022-07-17 NOTE — Telephone Encounter (Signed)
Faxed referral to Dr. Orlin Hilding - rheumatology [phone]

## 2022-07-18 ENCOUNTER — Encounter: Payer: Self-pay | Admitting: Family

## 2022-07-20 ENCOUNTER — Other Ambulatory Visit: Payer: Self-pay

## 2022-07-20 DIAGNOSIS — R1084 Generalized abdominal pain: Secondary | ICD-10-CM | POA: Diagnosis not present

## 2022-07-20 DIAGNOSIS — R0989 Other specified symptoms and signs involving the circulatory and respiratory systems: Secondary | ICD-10-CM

## 2022-07-20 DIAGNOSIS — M7989 Other specified soft tissue disorders: Secondary | ICD-10-CM

## 2022-07-20 DIAGNOSIS — R109 Unspecified abdominal pain: Secondary | ICD-10-CM | POA: Diagnosis not present

## 2022-07-21 DIAGNOSIS — R1084 Generalized abdominal pain: Secondary | ICD-10-CM | POA: Diagnosis not present

## 2022-07-21 DIAGNOSIS — R109 Unspecified abdominal pain: Secondary | ICD-10-CM | POA: Diagnosis not present

## 2022-07-25 DIAGNOSIS — G43719 Chronic migraine without aura, intractable, without status migrainosus: Secondary | ICD-10-CM | POA: Diagnosis not present

## 2022-07-26 DIAGNOSIS — F4312 Post-traumatic stress disorder, chronic: Secondary | ICD-10-CM | POA: Diagnosis not present

## 2022-07-26 DIAGNOSIS — F411 Generalized anxiety disorder: Secondary | ICD-10-CM | POA: Diagnosis not present

## 2022-07-26 DIAGNOSIS — F3131 Bipolar disorder, current episode depressed, mild: Secondary | ICD-10-CM | POA: Diagnosis not present

## 2022-07-31 ENCOUNTER — Encounter: Payer: Self-pay | Admitting: Family

## 2022-07-31 DIAGNOSIS — R319 Hematuria, unspecified: Secondary | ICD-10-CM | POA: Diagnosis not present

## 2022-08-02 ENCOUNTER — Other Ambulatory Visit: Payer: Self-pay | Admitting: *Deleted

## 2022-08-02 ENCOUNTER — Inpatient Hospital Stay: Payer: Medicaid Other

## 2022-08-02 DIAGNOSIS — D6859 Other primary thrombophilia: Secondary | ICD-10-CM

## 2022-08-02 DIAGNOSIS — Z79899 Other long term (current) drug therapy: Secondary | ICD-10-CM | POA: Diagnosis not present

## 2022-08-02 DIAGNOSIS — M3507 Sjogren syndrome with central nervous system involvement: Secondary | ICD-10-CM | POA: Diagnosis not present

## 2022-08-02 DIAGNOSIS — D509 Iron deficiency anemia, unspecified: Secondary | ICD-10-CM | POA: Diagnosis not present

## 2022-08-02 DIAGNOSIS — D500 Iron deficiency anemia secondary to blood loss (chronic): Secondary | ICD-10-CM

## 2022-08-02 LAB — CBC WITH DIFFERENTIAL (CANCER CENTER ONLY)
Abs Immature Granulocytes: 0.04 10*3/uL (ref 0.00–0.07)
Basophils Absolute: 0 10*3/uL (ref 0.0–0.1)
Basophils Relative: 0 %
Eosinophils Absolute: 0.1 10*3/uL (ref 0.0–0.5)
Eosinophils Relative: 1 %
HCT: 40.1 % (ref 36.0–46.0)
Hemoglobin: 12.6 g/dL (ref 12.0–15.0)
Immature Granulocytes: 0 %
Lymphocytes Relative: 20 %
Lymphs Abs: 2 10*3/uL (ref 0.7–4.0)
MCH: 26.6 pg (ref 26.0–34.0)
MCHC: 31.4 g/dL (ref 30.0–36.0)
MCV: 84.6 fL (ref 80.0–100.0)
Monocytes Absolute: 0.8 10*3/uL (ref 0.1–1.0)
Monocytes Relative: 8 %
Neutro Abs: 6.8 10*3/uL (ref 1.7–7.7)
Neutrophils Relative %: 71 %
Platelet Count: 372 10*3/uL (ref 150–400)
RBC: 4.74 MIL/uL (ref 3.87–5.11)
RDW: 19.6 % — ABNORMAL HIGH (ref 11.5–15.5)
WBC Count: 9.8 10*3/uL (ref 4.0–10.5)
nRBC: 0 % (ref 0.0–0.2)

## 2022-08-02 LAB — CMP (CANCER CENTER ONLY)
ALT: 13 U/L (ref 0–44)
AST: 11 U/L — ABNORMAL LOW (ref 15–41)
Albumin: 4.7 g/dL (ref 3.5–5.0)
Alkaline Phosphatase: 80 U/L (ref 38–126)
Anion gap: 8 (ref 5–15)
BUN: 9 mg/dL (ref 6–20)
CO2: 28 mmol/L (ref 22–32)
Calcium: 10.1 mg/dL (ref 8.9–10.3)
Chloride: 104 mmol/L (ref 98–111)
Creatinine: 0.67 mg/dL (ref 0.44–1.00)
GFR, Estimated: >60 mL/min (ref >60)
Glucose, Bld: 90 mg/dL (ref 70–99)
Potassium: 3.8 mmol/L (ref 3.5–5.1)
Sodium: 140 mmol/L (ref 135–145)
Total Bilirubin: 0.2 mg/dL — ABNORMAL LOW (ref 0.3–1.2)
Total Protein: 7.2 g/dL (ref 6.5–8.1)

## 2022-08-02 LAB — IRON AND IRON BINDING CAPACITY (CC-WL,HP ONLY)
Iron: 87 ug/dL (ref 28–170)
Saturation Ratios: 33 % — ABNORMAL HIGH (ref 10.4–31.8)
TIBC: 262 ug/dL (ref 250–450)
UIBC: 175 ug/dL (ref 148–442)

## 2022-08-02 LAB — FERRITIN: Ferritin: 220 ng/mL (ref 11–307)

## 2022-08-02 LAB — SAMPLE TO BLOOD BANK

## 2022-08-02 LAB — PHOSPHORUS: Phosphorus: 2.4 mg/dL — ABNORMAL LOW (ref 2.5–4.6)

## 2022-08-09 DIAGNOSIS — F4312 Post-traumatic stress disorder, chronic: Secondary | ICD-10-CM | POA: Diagnosis not present

## 2022-08-09 DIAGNOSIS — F411 Generalized anxiety disorder: Secondary | ICD-10-CM | POA: Diagnosis not present

## 2022-08-09 DIAGNOSIS — F3131 Bipolar disorder, current episode depressed, mild: Secondary | ICD-10-CM | POA: Diagnosis not present

## 2022-08-13 DIAGNOSIS — R310 Gross hematuria: Secondary | ICD-10-CM | POA: Diagnosis not present

## 2022-08-13 DIAGNOSIS — R6889 Other general symptoms and signs: Secondary | ICD-10-CM | POA: Diagnosis not present

## 2022-08-13 DIAGNOSIS — E039 Hypothyroidism, unspecified: Secondary | ICD-10-CM | POA: Diagnosis not present

## 2022-08-13 DIAGNOSIS — R635 Abnormal weight gain: Secondary | ICD-10-CM | POA: Diagnosis not present

## 2022-08-15 DIAGNOSIS — R635 Abnormal weight gain: Secondary | ICD-10-CM | POA: Diagnosis not present

## 2022-08-15 DIAGNOSIS — R6889 Other general symptoms and signs: Secondary | ICD-10-CM | POA: Diagnosis not present

## 2022-08-15 DIAGNOSIS — E039 Hypothyroidism, unspecified: Secondary | ICD-10-CM | POA: Diagnosis not present

## 2022-08-21 DIAGNOSIS — N939 Abnormal uterine and vaginal bleeding, unspecified: Secondary | ICD-10-CM | POA: Diagnosis not present

## 2022-08-21 DIAGNOSIS — R310 Gross hematuria: Secondary | ICD-10-CM | POA: Diagnosis not present

## 2022-08-22 ENCOUNTER — Ambulatory Visit (HOSPITAL_COMMUNITY): Payer: Medicaid Other | Attending: Vascular Surgery

## 2022-08-22 ENCOUNTER — Ambulatory Visit: Payer: Medicaid Other | Admitting: Vascular Surgery

## 2022-08-22 ENCOUNTER — Encounter (HOSPITAL_COMMUNITY): Payer: Self-pay

## 2022-08-22 ENCOUNTER — Ambulatory Visit (HOSPITAL_COMMUNITY): Payer: Medicaid Other

## 2022-08-23 DIAGNOSIS — F3131 Bipolar disorder, current episode depressed, mild: Secondary | ICD-10-CM | POA: Diagnosis not present

## 2022-08-23 DIAGNOSIS — F4312 Post-traumatic stress disorder, chronic: Secondary | ICD-10-CM | POA: Diagnosis not present

## 2022-08-23 DIAGNOSIS — F411 Generalized anxiety disorder: Secondary | ICD-10-CM | POA: Diagnosis not present

## 2022-08-29 ENCOUNTER — Encounter: Payer: Self-pay | Admitting: Obstetrics and Gynecology

## 2022-08-30 ENCOUNTER — Telehealth: Payer: Self-pay | Admitting: *Deleted

## 2022-08-30 NOTE — Telephone Encounter (Signed)
Returned call from 10:44 AM. Left spouse of patient a message that as of this time today, the office does not have any available appointments prior to her appointment on 09/05/2022.

## 2022-09-03 ENCOUNTER — Ambulatory Visit (HOSPITAL_COMMUNITY)
Admission: RE | Admit: 2022-09-03 | Discharge: 2022-09-03 | Disposition: A | Discharge status: Home / Self Care | Payer: Medicaid Other | Source: Ambulatory Visit | Attending: Vascular Surgery | Admitting: Vascular Surgery

## 2022-09-03 DIAGNOSIS — R0989 Other specified symptoms and signs involving the circulatory and respiratory systems: Secondary | ICD-10-CM | POA: Insufficient documentation

## 2022-09-03 DIAGNOSIS — M7989 Other specified soft tissue disorders: Secondary | ICD-10-CM | POA: Insufficient documentation

## 2022-09-05 ENCOUNTER — Telehealth (INDEPENDENT_AMBULATORY_CARE_PROVIDER_SITE_OTHER): Payer: Medicaid Other | Admitting: Obstetrics and Gynecology

## 2022-09-05 ENCOUNTER — Other Ambulatory Visit: Payer: Self-pay | Admitting: Obstetrics and Gynecology

## 2022-09-05 ENCOUNTER — Encounter: Payer: Self-pay | Admitting: Obstetrics and Gynecology

## 2022-09-05 VITALS — BP 156/85 | HR 92 | Wt 183.0 lb

## 2022-09-05 DIAGNOSIS — N8003 Adenomyosis of the uterus: Secondary | ICD-10-CM

## 2022-09-05 DIAGNOSIS — Z975 Presence of (intrauterine) contraceptive device: Secondary | ICD-10-CM

## 2022-09-05 DIAGNOSIS — N920 Excessive and frequent menstruation with regular cycle: Secondary | ICD-10-CM | POA: Diagnosis not present

## 2022-09-05 MED ORDER — NORETHINDRONE ACETATE 5 MG PO TABS
5.0000 mg | ORAL_TABLET | Freq: Every day | ORAL | 2 refills | Status: DC
Start: 2022-09-05 — End: 2023-02-13

## 2022-09-05 NOTE — Progress Notes (Signed)
GYNECOLOGY VIRTUAL VISIT ENCOUNTER NOTE  Provider location: Center for [facility] [place]   Patient location: Home  I connected with Yesenia Harper on 09/05/22 at  2:30 PM EDT by MyChart Video Encounter and verified that I am speaking with the correct person using two identifiers.   I discussed the limitations, risks, security and privacy concerns of performing an evaluation and management service virtually and the availability of in person appointments. I also discussed with the patient that there may be a patient responsible charge related to this service. The patient expressed understanding and agreed to proceed.   History:  Yesenia Harper is a 37 y.o. [phone] female being evaluated today for follow up after IUD insertion in June 2024. She previously had been on the Nexplanon which historically had worked well for her but then she had the irregular bleeding and BTB. Since the IUD insertion, she had daily bleeding and had it removed on 8/13.  Her periods remain very heavy using multiple pads at a time and having to change them frequently.  Her periods are also painful.  She has been told in the past that she likely has adenomyosis.  Of note she is on Xarelto due to h/o VTE in the setting of May-Thurner Syndrome.  She has a history of two c-sections. She is done with child bearing.   She saw Dr. Marda Stalker Carlyon Shadow, MD who is a Urogyn and had the IUD removed.  Her workup at that time was negative for hematuria and her workup with the nephrologist was negative as well.  She uses the Nexplanon primarily for birth control.  The following portions of the patient's history were reviewed and updated as appropriate: allergies, current medications, past family history, past medical history, past social history, past surgical history and problem list.   Review of Systems:  Pertinent items noted in HPI and remainder of comprehensive ROS otherwise negative.  Physical Exam:    General:  Alert, oriented and cooperative. Patient appears to be in no acute distress.  Mental Status: Normal mood and affect. Normal behavior. Normal judgment and thought content.   Respiratory: Normal respiratory effort, no problems with respiration noted  Rest of physical exam deferred due to type of encounter   Assessment and Plan:  Diagnoses and all orders for this visit:  Menorrhagia with regular cycle Due Xarelto - recent testing was negative for VTE (8/26).  Prior US shows no anatomic cause that would be worsening it.  We discussed her options again discussing in more detail ablation versus uterine artery embolization.  We discussed hysterectomy as a last resort.  I would recommend starting Aygestin to help with the bleeding and in the meantime I would recommend MRI and IR consult with consideration for embolization due to the adenomyosis being likely based on her history. -We discussed I would do this as the next step over ablation as it would be technically less invasive. -If the [place] is unsuccessful I would then recommend ablation at that point. -I will contact IR to confirm there is no issue with Xarelto.  The patient does report she has had no issues with stopping it a couple days or so before IR based procedures in the past.  Adenomyosis -     MR PELVIS W WO CONTRAST; Future -     Ambulatory referral to Interventional Radiology  Breakthrough bleeding on Nexplanon -Start Aygestin.  If she does not have improvement in her bleeding in 2 weeks she will  reach out to me. -     norethindrone (AYGESTIN) 5 MG tablet; Take 1 tablet (5 mg total) by mouth daily.       I discussed the assessment and treatment plan with the patient. The patient was provided an opportunity to ask questions and all were answered. The patient agreed with the plan and demonstrated an understanding of the instructions.   The patient was advised to call back or seek an in-person evaluation/go to the ED if  the symptoms worsen or if the condition fails to improve as anticipated.  I provided 19 minutes of face-to-face time during this encounter.   Milas Hock, MD Center for [facility], [facility]

## 2022-09-06 ENCOUNTER — Telehealth: Payer: Self-pay | Admitting: *Deleted

## 2022-09-06 ENCOUNTER — Encounter: Payer: Self-pay | Admitting: Obstetrics and Gynecology

## 2022-09-06 DIAGNOSIS — F411 Generalized anxiety disorder: Secondary | ICD-10-CM | POA: Diagnosis not present

## 2022-09-06 DIAGNOSIS — F4312 Post-traumatic stress disorder, chronic: Secondary | ICD-10-CM | POA: Diagnosis not present

## 2022-09-06 DIAGNOSIS — F3131 Bipolar disorder, current episode depressed, mild: Secondary | ICD-10-CM | POA: Diagnosis not present

## 2022-09-06 NOTE — Telephone Encounter (Signed)
LM on cell phone voicemail of Dr Lianne Bushy recommendation of no issues with her Xarelto and [place].

## 2022-09-06 NOTE — Telephone Encounter (Signed)
-----   Message from Milas Hock sent at 09/06/2022  3:31 PM EDT ----- Regarding: FW: [place] and Xarelto Can you let her know it would be no issue to have the [place] with her Xarelto.   Thanks,  Pad ----- Message ----- From: Irish Lack, MD Sent: 09/06/2022   8:05 AM EDT To: Milas Hock, MD Subject: RE: [place] and Xarelto                            Hi Gunnar Fusi,  Just a 2 day hold.  Sherrine Maples ----- Message ----- From: Milas Hock, MD Sent: 09/05/2022   3:07 PM EDT To: Irish Lack, MD Subject: [place] and Xarelto                                I wanted to confirm - you would have no specific issues doing a [place] on someone on Xarelto provided they stopped it in advance of the procedure? Do you have a preference for stopping it timing-wise?  Thanks!  Milas Hock

## 2022-09-07 ENCOUNTER — Inpatient Hospital Stay (HOSPITAL_COMMUNITY): Admission: RE | Admit: 2022-09-07 | Payer: Medicaid Other | Source: Ambulatory Visit

## 2022-09-07 ENCOUNTER — Telehealth: Payer: Self-pay | Admitting: *Deleted

## 2022-09-07 ENCOUNTER — Encounter (HOSPITAL_COMMUNITY): Payer: Medicaid Other

## 2022-09-07 NOTE — Telephone Encounter (Signed)
Left patient a message to call the office to discuss referral for MRI.

## 2022-09-12 ENCOUNTER — Ambulatory Visit (INDEPENDENT_AMBULATORY_CARE_PROVIDER_SITE_OTHER): Payer: Medicaid Other | Admitting: Vascular Surgery

## 2022-09-12 ENCOUNTER — Encounter: Payer: Self-pay | Admitting: Vascular Surgery

## 2022-09-12 ENCOUNTER — Ambulatory Visit (HOSPITAL_COMMUNITY)
Admission: RE | Admit: 2022-09-12 | Discharge: 2022-09-12 | Disposition: A | Discharge status: Home / Self Care | Payer: Medicaid Other | Source: Ambulatory Visit | Attending: Vascular Surgery | Admitting: Vascular Surgery

## 2022-09-12 ENCOUNTER — Encounter (HOSPITAL_COMMUNITY): Payer: Medicaid Other

## 2022-09-12 ENCOUNTER — Encounter (HOSPITAL_COMMUNITY): Payer: Self-pay

## 2022-09-12 VITALS — BP 118/87 | HR 90 | Temp 98.1°F | Ht 62.0 in | Wt 183.0 lb

## 2022-09-12 DIAGNOSIS — I771 Stricture of artery: Secondary | ICD-10-CM | POA: Diagnosis not present

## 2022-09-12 DIAGNOSIS — R0989 Other specified symptoms and signs involving the circulatory and respiratory systems: Secondary | ICD-10-CM

## 2022-09-12 DIAGNOSIS — M7989 Other specified soft tissue disorders: Secondary | ICD-10-CM | POA: Diagnosis not present

## 2022-09-12 DIAGNOSIS — I774 Celiac artery compression syndrome: Secondary | ICD-10-CM

## 2022-09-12 MED ORDER — RIVAROXABAN 20 MG PO TABS: 20.0000 mg | ORAL_TABLET | Freq: Every day | ORAL | 11 refills | Status: DC

## 2022-09-12 NOTE — Progress Notes (Signed)
Patient ID: Yesenia Harper, female   DOB: 1985/02/13, 37 y.o.   MRN: [id number]  Reason for Consult: Follow-up   Referred by Christen Butter, NP  Subjective:     HPI:  Yesenia Harper is a 37 y.o. female history of appendectomy and arcuate ligament syndrome now status post median arcuate ligament release [facility].  She also has a history of treatment of [name] with stenting performed by interventional radiology.  She states that she is being worked up for uterine artery embolization given ongoing pelvic pain.  She had initially gained significant weight but now states that she has lost about 15 pounds because she cannot eat.  She remains anticoagulated for history of multiple DVTs and PEs even since treatment of her May-Thurner syndrome.  She is here today to evaluate her left lower extremity which has persistent pain and swelling but no evidence of varicosities and no wounds or tissue loss.  Past Medical History:  Diagnosis Date   Anxiety    Anxiety with depression 08/03/2019   Bilateral carpal tunnel syndrome 02/23/2011   Cervical radiculitis 09/17/2013   Chiari malformation type I (HCC) 04/28/2013   Cognitive impairment, mild, so stated 05/23/2014   Degenerative joint disease of cervical and lumbar spine 05/23/2014   Delivery of pregnancy by cesarean section    Depression    Dry cough 08/03/2019   Epidermal inclusion cyst 04/10/2012   Eye pain 04/24/2013   Fever of unknown origin (FUO) 11/05/2014   Fluid retention in legs 06/06/2021   Gastrointestinal symptoms 06/06/2021   Formatting of this note might be different from the original.     Last Assessment & Plan:  Formatting of this note might be different from the original.   Hematuria 12/18/2019   History of C-section 05/12/2014   Formatting of this note might be different from the original. 2006 LTCS for breech & NRFHR   History of DVT (deep vein thrombosis) 07/05/2020   Hypothyroidism    Lumbar radiculopathy  09/17/2013   May-Thurner syndrome 12/13/2020   MDD (major depressive disorder), recurrent episode, mild (HCC) 04/04/2012   Migraine headache without aura 04/10/2012   Myopia of both eyes 04/24/2013   Neck pain 09/17/2013   Neuropathy, median nerve 09/17/2013   Pain in joint involving left lower leg 07/05/2020   Pain of left lower extremity 07/05/2020   Scoliosis 12/18/2019   Visual disturbance 04/24/2013   Family History  Problem Relation Age of Onset   Hypertension Mother    Diabetes Mother    Colon polyps Mother    Hypertension Father    Diabetes Father    Colon polyps Father    Diabetes Maternal Grandmother    Hypertension Maternal Grandmother    Stroke Maternal Grandmother    Diabetes Maternal Grandfather    Hypertension Maternal Grandfather    Stroke Maternal Grandfather    Diabetes Paternal Grandmother    Hypertension Paternal Grandmother    Stroke Paternal Grandmother    Diabetes Paternal Grandfather    Hypertension Paternal Grandfather    Stroke Paternal Grandfather    Colon cancer Neg Hx    Pancreatic cancer Neg Hx    Stomach cancer Neg Hx    Esophageal cancer Neg Hx    Liver cancer Neg Hx    Past Surgical History:  Procedure Laterality Date   CESAREAN SECTION     x2   IR RADIOLOGIST EVAL & MGMT  08/23/2020   IR RADIOLOGIST EVAL & MGMT  05/30/2021  IR RADIOLOGIST EVAL & MGMT  10/26/2021   VISCERAL ANGIOGRAPHY N/A 10/16/2021   Procedure: MESENTRIC ANGIOGRAPHY;  Surgeon: Maeola Harman, MD;  Location: [facility] INVASIVE CV LAB;  Service: Cardiovascular;  Laterality: N/A;    Short Social History:  Social History   Tobacco Use   Smoking status: Never   Smokeless tobacco: Never  Substance Use Topics   Alcohol use: Not Currently    Allergies  Allergen Reactions   Penicillins Itching and Rash    Patient feels itching in mouth.    Current Outpatient Medications  Medication Sig Dispense Refill   acetaminophen (TYLENOL) 500 MG tablet Take by mouth.      ALPRAZolam (XANAX) 0.25 MG tablet Take 0.25 mg by mouth 2 (two) times daily as needed for anxiety.     buPROPion (WELLBUTRIN XL) 300 MG 24 hr tablet Take 300 mg by mouth.     cyanocobalamin (VITAMIN B12) 1000 MCG/ML injection Inject 1,000 mcg into the muscle every Wednesday.     etonogestrel (NEXPLANON) 68 MG IMPL implant Inject 1 each into the skin once.     furosemide (LASIX) 20 MG tablet Take 20 mg by mouth in the morning.     hydroxychloroquine (PLAQUENIL) 200 MG tablet Take 400 mg by mouth daily.     levothyroxine (SYNTHROID) 75 MCG tablet Take one tablet by mouth daily on Mondays, Tuesdays, Wednesdays, Thursdays and Fridays.  Take one and one-half tablet on Saturdays and Sundays. NEEDS APPOINTMENT FOR FURTHER REFILLS. 102 tablet 0   linaclotide (LINZESS) 290 MCG CAPS capsule Take 290 mcg by mouth daily.     liothyronine (CYTOMEL) 5 MCG tablet Take 5 mcg by mouth daily.     Multiple Vitamin (MULTIVITAMIN ADULT PO) Take 1 tablet by mouth in the morning.     norethindrone (AYGESTIN) 5 MG tablet Take 1 tablet (5 mg total) by mouth daily. 30 tablet 2   NURTEC 75 MG TBDP Take 75 mg by mouth daily as needed for migraine.     ondansetron (ZOFRAN-ODT) 8 MG disintegrating tablet TAKE 1 TABLET BY MOUTH EVERY 8 HOURS AS NEEDED FOR NAUSEA 60 tablet 0   QULIPTA 60 MG TABS Take 60 mg by mouth daily.     REXULTI 1 MG TABS tablet Take 1 mg by mouth daily.     spironolactone (ALDACTONE) 25 MG tablet Take 25 mg by mouth in the morning.     TIADYLT ER 120 MG 24 hr capsule Take 120 mg by mouth daily.     traZODone (DESYREL) 100 MG tablet Take 100 mg by mouth at bedtime.     Vilazodone HCl (VIIBRYD) 40 MG TABS Take 1 tablet (40 mg total) by mouth daily. 90 tablet 1   Vitamin D, Ergocalciferol, (DRISDOL) 1.25 MG (50000 UNIT) CAPS capsule Take 50,000 Units by mouth every Thursday.     XARELTO 20 MG TABS tablet Take 20 mg by mouth in the morning.     zolpidem (AMBIEN) 10 MG tablet Take 10 mg by mouth at bedtime as  needed.     No current facility-administered medications for this visit.    Review of Systems  Constitutional: Positive for unexpected weight change.  HENT: HENT negative.  Eyes: Eyes negative.  Cardiovascular: Positive for leg swelling.  GI: Positive for abdominal pain.  Musculoskeletal: Musculoskeletal negative. Positive for leg pain.  Skin: Skin negative.  Neurological: Neurological negative. Hematologic: Hematologic/lymphatic negative.  Psychiatric: Psychiatric negative.        Objective:  Objective   Vitals:  09/12/22 1230  BP: 118/87  Pulse: 90  Temp: 98.1 F (36.7 C)  SpO2: 97%  Weight: 183 lb (83 kg)  Height: 5\' 2"  (1.575 m)   Body mass index is 33.47 kg/m.  Physical Exam HENT:     Head: Normocephalic.     Nose: Nose normal.  Eyes:     Pupils: Pupils are equal, round, and reactive to light.  Cardiovascular:     Rate and Rhythm: Normal rate.     Pulses: Normal pulses.  Pulmonary:     Effort: Pulmonary effort is normal.  Abdominal:     General: Abdomen is flat.  Musculoskeletal:     Cervical back: Normal range of motion and neck supple.     Right lower leg: No edema.     Left lower leg: No edema.  Skin:    General: Skin is warm.     Capillary Refill: Capillary refill takes less than 2 seconds.  Neurological:     General: No focal deficit present.     Mental Status: She is alert.  Psychiatric:        Mood and Affect: Mood normal.        Thought Content: Thought content normal.        Judgment: Judgment normal.     Data: LEFT          Reflux NoRefluxReflux TimeDiameter cmsComments                          Yes                                   +--------------+---------+------+-----------+------------+--------+  CFV                    yes   >1 second                       +--------------+---------+------+-----------+------------+--------+  FV mid                  yes   >1 second                        +--------------+---------+------+-----------+------------+--------+  Popliteal              yes   >1 second                       +--------------+---------+------+-----------+------------+--------+  GSV at SFJ              yes    >500 ms      0.72              +--------------+---------+------+-----------+------------+--------+  GSV prox thigh          yes    >500 ms      0.51              +--------------+---------+------+-----------+------------+--------+  GSV mid thigh           yes    >500 ms      0.26              +--------------+---------+------+-----------+------------+--------+  GSV dist thigh          yes    >500 ms      0.30              +--------------+---------+------+-----------+------------+--------+  GSV at knee             yes    >500 ms      0.45              +--------------+---------+------+-----------+------------+--------+  GSV prox calf           yes    >500 ms      0.23              +--------------+---------+------+-----------+------------+--------+  GSV mid calf  no                            0.19              +--------------+---------+------+-----------+------------+--------+  SSV Pop Fossa no                            0.27              +--------------+---------+------+-----------+------------+--------+  SSV prox calf           yes    >500 ms      0.25              +--------------+---------+------+-----------+------------+--------+  SSV mid calf  no                            0.29              +--------------+---------+------+-----------+------------+--------+  AASV O        no                            0.24              +--------------+---------+------+-----------+------------+--------+  AASV P        no                            0.20              +--------------+---------+------+-----------+------------+--------+  AASV M        no                            0.19               +--------------+---------+------+-----------+------------+--------+        Summary:  Left:  - No evidence of deep vein thrombosis seen in the left lower extremity,  from the common femoral through the popliteal veins.  - Venous reflux is noted in the left common femoral vein.  - Venous reflux is noted in the left sapheno-femoral junction.  - Venous reflux is noted in the left greater saphenous vein in the thigh.  - Venous reflux is noted in the left greater saphenous vein in the calf.  - Venous reflux is noted in the left femoral vein.  - Venous reflux is noted in the left popliteal vein.  - Venous reflux is noted in the left short saphenous vein.  - No reflux in the AASV.   IVC/Iliac Findings:  +----------+------+--------+--------+    IVC    PatentThrombusComments  +----------+------+--------+--------+  IVC Prox  patent                  +----------+------+--------+--------+  IVC Mid   patent                  +----------+------+--------+--------+  IVC Distalpatent                  +----------+------+--------+--------+     +-------------------+---------+-----------+---------+-----------+--------+         CIV        RT-PatentRT-ThrombusLT-PatentLT-ThrombusComments  +-------------------+---------+-----------+---------+-----------+--------+  Common Iliac Prox   patent              patent                       +-------------------+---------+-----------+---------+-----------+--------+  Common Iliac Mid                        patent                       +-------------------+---------+-----------+---------+-----------+--------+  Common Iliac Distal                     patent                       +-------------------+---------+-----------+---------+-----------+--------+      +-------------------------+---------+-----------+---------+-----------+----  ----+            EIV             RT-PatentRT-ThrombusLT-PatentLT-ThrombusComments  +-------------------------+---------+-----------+---------+-----------+----  ----+  External Iliac Vein Prox                      patent                        +-------------------------+---------+-----------+---------+-----------+----  ----+  External Iliac Vein Mid                       patent                        +-------------------------+---------+-----------+---------+-----------+----  ----+  External Iliac Vein                           patent                        Distal                                                                        Summary:  IVC/Iliac: There is no evidence of thrombus involving the IVC. No evidence  of DVT in the Left common iliac and external iliac vein stents.         Assessment/Plan:    37 year old female with history of multiple vascular issues more recently evaluated for left lower extremity swelling.  She does have some saphenous vein reflux but given her young age and lack of varicosities I would not recommend any intervention particularly with [name].  She is being evaluated for uterine artery embolization.  She does have continued abdominal pain with some concern for celiac artery stenosis after compression release.  We will follow her up in a few months with mesenteric duplex to evaluate for continued celiac artery disease.     Maeola Harman MD [facility]

## 2022-09-13 ENCOUNTER — Encounter: Payer: Self-pay | Admitting: Obstetrics and Gynecology

## 2022-09-17 ENCOUNTER — Other Ambulatory Visit: Payer: Self-pay | Admitting: Obstetrics and Gynecology

## 2022-09-17 DIAGNOSIS — N8003 Adenomyosis of the uterus: Secondary | ICD-10-CM

## 2022-09-25 ENCOUNTER — Other Ambulatory Visit: Payer: Self-pay

## 2022-09-25 DIAGNOSIS — R079 Chest pain, unspecified: Secondary | ICD-10-CM | POA: Diagnosis not present

## 2022-09-25 DIAGNOSIS — R0789 Other chest pain: Secondary | ICD-10-CM | POA: Diagnosis not present

## 2022-09-25 DIAGNOSIS — M546 Pain in thoracic spine: Secondary | ICD-10-CM | POA: Diagnosis not present

## 2022-09-25 DIAGNOSIS — I771 Stricture of artery: Secondary | ICD-10-CM

## 2022-09-25 DIAGNOSIS — K551 Chronic vascular disorders of intestine: Secondary | ICD-10-CM

## 2022-09-26 ENCOUNTER — Telehealth: Payer: Self-pay | Admitting: General Practice

## 2022-09-26 ENCOUNTER — Other Ambulatory Visit: Payer: Medicaid Other

## 2022-09-26 NOTE — Transitions of Care (Post Inpatient/ED Visit) (Signed)
09/26/2022  Name: Yesenia Harper MRN: [id number] DOB: [birth year]  Today's TOC FU Call Status: Today's TOC FU Call Status:: Unsuccessful Call (1st Attempt) Unsuccessful Call (1st Attempt) Date: 09/26/22  Attempted to reach the patient regarding the most recent Inpatient/ED visit.  Follow Up Plan: Additional outreach attempts will be made to reach the patient to complete the Transitions of Care (Post Inpatient/ED visit) call.   Signature [place] Charon, Control and instrumentation engineer

## 2022-09-28 ENCOUNTER — Inpatient Hospital Stay: Payer: Medicaid Other

## 2022-09-28 ENCOUNTER — Inpatient Hospital Stay: Payer: Medicaid Other | Admitting: Family

## 2022-10-01 ENCOUNTER — Ambulatory Visit: Payer: Medicaid Other

## 2022-10-01 ENCOUNTER — Other Ambulatory Visit: Payer: Medicaid Other

## 2022-10-01 DIAGNOSIS — R197 Diarrhea, unspecified: Secondary | ICD-10-CM | POA: Diagnosis not present

## 2022-10-01 DIAGNOSIS — R1011 Right upper quadrant pain: Secondary | ICD-10-CM | POA: Diagnosis not present

## 2022-10-01 DIAGNOSIS — Z8719 Personal history of other diseases of the digestive system: Secondary | ICD-10-CM | POA: Insufficient documentation

## 2022-10-01 NOTE — Transitions of Care (Post Inpatient/ED Visit) (Signed)
10/01/2022  Name: Yesenia Harper MRN: [id number] DOB: 04-Nov-1985  Today's TOC FU Call Status: Today's TOC FU Call Status:: Unsuccessful Call (2nd Attempt) Unsuccessful Call (1st Attempt) Date: 09/26/22 Unsuccessful Call (2nd Attempt) Date: 10/01/22  Attempted to reach the patient regarding the most recent Inpatient/ED visit.  Follow Up Plan: Additional outreach attempts will be made to reach the patient to complete the Transitions of Care (Post Inpatient/ED visit) call.   Signature [place] Charon, Control and instrumentation engineer

## 2022-10-02 NOTE — Transitions of Care (Post Inpatient/ED Visit) (Signed)
10/02/2022  Name: Yesenia Harper MRN: [id number] DOB: [birth year]  Today's TOC FU Call Status: Today's TOC FU Call Status:: Unsuccessful Call (3rd Attempt) Unsuccessful Call (1st Attempt) Date: 09/26/22 Unsuccessful Call (2nd Attempt) Date: 10/01/22 Unsuccessful Call (3rd Attempt) Date: 10/02/22  Attempted to reach the patient regarding the most recent Inpatient/ED visit.  Follow Up Plan: No further outreach attempts will be made at this time. We have been unable to contact the patient.  Signature [place] Charon, Control and instrumentation engineer

## 2022-10-04 ENCOUNTER — Inpatient Hospital Stay: Admission: RE | Admit: 2022-10-04 | Payer: Medicaid Other | Source: Ambulatory Visit

## 2022-10-07 ENCOUNTER — Encounter (HOSPITAL_BASED_OUTPATIENT_CLINIC_OR_DEPARTMENT_OTHER): Payer: Self-pay

## 2022-10-07 ENCOUNTER — Ambulatory Visit (HOSPITAL_BASED_OUTPATIENT_CLINIC_OR_DEPARTMENT_OTHER): Payer: Medicaid Other

## 2022-10-10 DIAGNOSIS — R1011 Right upper quadrant pain: Secondary | ICD-10-CM | POA: Diagnosis not present

## 2022-10-10 DIAGNOSIS — R197 Diarrhea, unspecified: Secondary | ICD-10-CM | POA: Diagnosis not present

## 2022-10-10 DIAGNOSIS — K760 Fatty (change of) liver, not elsewhere classified: Secondary | ICD-10-CM | POA: Diagnosis not present

## 2022-10-11 DIAGNOSIS — L680 Hirsutism: Secondary | ICD-10-CM | POA: Diagnosis not present

## 2022-10-11 DIAGNOSIS — F331 Major depressive disorder, recurrent, moderate: Secondary | ICD-10-CM | POA: Diagnosis not present

## 2022-10-11 DIAGNOSIS — F4312 Post-traumatic stress disorder, chronic: Secondary | ICD-10-CM | POA: Diagnosis not present

## 2022-10-11 DIAGNOSIS — F411 Generalized anxiety disorder: Secondary | ICD-10-CM | POA: Diagnosis not present

## 2022-10-11 DIAGNOSIS — R635 Abnormal weight gain: Secondary | ICD-10-CM | POA: Diagnosis not present

## 2022-10-11 DIAGNOSIS — E039 Hypothyroidism, unspecified: Secondary | ICD-10-CM | POA: Diagnosis not present

## 2022-10-11 NOTE — Progress Notes (Signed)
Fax received from [facility] on 10/05/22 for medical clearance/medication hold for uterine fibroid embolization to be signed by B. Randie Heinz, MD.  Provider signed on 10/10/22, scanned into pt's chart, media routed via MyChart to sender on 10/11/22.

## 2022-10-12 DIAGNOSIS — F331 Major depressive disorder, recurrent, moderate: Secondary | ICD-10-CM | POA: Diagnosis not present

## 2022-10-12 DIAGNOSIS — F4312 Post-traumatic stress disorder, chronic: Secondary | ICD-10-CM | POA: Diagnosis not present

## 2022-10-12 DIAGNOSIS — F411 Generalized anxiety disorder: Secondary | ICD-10-CM | POA: Diagnosis not present

## 2022-10-16 ENCOUNTER — Other Ambulatory Visit: Payer: Medicaid Other

## 2022-10-18 DIAGNOSIS — R109 Unspecified abdominal pain: Secondary | ICD-10-CM | POA: Diagnosis not present

## 2022-10-23 DIAGNOSIS — K828 Other specified diseases of gallbladder: Secondary | ICD-10-CM | POA: Diagnosis not present

## 2022-10-23 DIAGNOSIS — R109 Unspecified abdominal pain: Secondary | ICD-10-CM | POA: Diagnosis not present

## 2022-10-23 DIAGNOSIS — E669 Obesity, unspecified: Secondary | ICD-10-CM | POA: Diagnosis not present

## 2022-10-23 HISTORY — PX: LAPAROSCOPIC CHOLECYSTECTOMY: SUR755

## 2022-10-29 DIAGNOSIS — G4700 Insomnia, unspecified: Secondary | ICD-10-CM | POA: Diagnosis not present

## 2022-10-29 DIAGNOSIS — G5603 Carpal tunnel syndrome, bilateral upper limbs: Secondary | ICD-10-CM | POA: Diagnosis not present

## 2022-10-29 DIAGNOSIS — M542 Cervicalgia: Secondary | ICD-10-CM | POA: Diagnosis not present

## 2022-10-29 DIAGNOSIS — M5412 Radiculopathy, cervical region: Secondary | ICD-10-CM | POA: Diagnosis not present

## 2022-10-29 DIAGNOSIS — M545 Low back pain, unspecified: Secondary | ICD-10-CM | POA: Diagnosis not present

## 2022-10-29 DIAGNOSIS — G43109 Migraine with aura, not intractable, without status migrainosus: Secondary | ICD-10-CM | POA: Diagnosis not present

## 2022-10-29 DIAGNOSIS — M5417 Radiculopathy, lumbosacral region: Secondary | ICD-10-CM | POA: Diagnosis not present

## 2022-11-02 ENCOUNTER — Encounter: Payer: Self-pay | Admitting: Vascular Surgery

## 2022-11-07 DIAGNOSIS — G43719 Chronic migraine without aura, intractable, without status migrainosus: Secondary | ICD-10-CM | POA: Diagnosis not present

## 2022-11-07 DIAGNOSIS — R251 Tremor, unspecified: Secondary | ICD-10-CM | POA: Diagnosis not present

## 2022-11-09 DIAGNOSIS — F3131 Bipolar disorder, current episode depressed, mild: Secondary | ICD-10-CM | POA: Diagnosis not present

## 2022-11-09 DIAGNOSIS — F4312 Post-traumatic stress disorder, chronic: Secondary | ICD-10-CM | POA: Diagnosis not present

## 2022-11-09 DIAGNOSIS — F331 Major depressive disorder, recurrent, moderate: Secondary | ICD-10-CM | POA: Diagnosis not present

## 2022-11-09 DIAGNOSIS — F411 Generalized anxiety disorder: Secondary | ICD-10-CM | POA: Diagnosis not present

## 2022-11-12 ENCOUNTER — Inpatient Hospital Stay (HOSPITAL_BASED_OUTPATIENT_CLINIC_OR_DEPARTMENT_OTHER): Payer: Medicaid Other | Admitting: Medical Oncology

## 2022-11-12 ENCOUNTER — Encounter: Payer: Self-pay | Admitting: Medical Oncology

## 2022-11-12 ENCOUNTER — Other Ambulatory Visit: Payer: Self-pay

## 2022-11-12 ENCOUNTER — Inpatient Hospital Stay: Payer: Medicaid Other | Attending: Hematology & Oncology

## 2022-11-12 VITALS — BP 118/89 | HR 104 | Temp 98.0°F | Resp 18 | Ht 62.0 in | Wt 177.0 lb

## 2022-11-12 DIAGNOSIS — Z7901 Long term (current) use of anticoagulants: Secondary | ICD-10-CM | POA: Insufficient documentation

## 2022-11-12 DIAGNOSIS — D500 Iron deficiency anemia secondary to blood loss (chronic): Secondary | ICD-10-CM

## 2022-11-12 DIAGNOSIS — Z86718 Personal history of other venous thrombosis and embolism: Secondary | ICD-10-CM | POA: Diagnosis not present

## 2022-11-12 DIAGNOSIS — D6859 Other primary thrombophilia: Secondary | ICD-10-CM

## 2022-11-12 DIAGNOSIS — N920 Excessive and frequent menstruation with regular cycle: Secondary | ICD-10-CM | POA: Diagnosis not present

## 2022-11-12 DIAGNOSIS — I871 Compression of vein: Secondary | ICD-10-CM

## 2022-11-12 LAB — CBC WITH DIFFERENTIAL (CANCER CENTER ONLY)
Abs Immature Granulocytes: 0.02 10*3/uL (ref 0.00–0.07)
Basophils Absolute: 0 10*3/uL (ref 0.0–0.1)
Basophils Relative: 0 %
Eosinophils Absolute: 0.1 10*3/uL (ref 0.0–0.5)
Eosinophils Relative: 1 %
HCT: 39 % (ref 36.0–46.0)
Hemoglobin: 12.7 g/dL (ref 12.0–15.0)
Immature Granulocytes: 0 %
Lymphocytes Relative: 30 %
Lymphs Abs: 2.6 10*3/uL (ref 0.7–4.0)
MCH: 29.2 pg (ref 26.0–34.0)
MCHC: 32.6 g/dL (ref 30.0–36.0)
MCV: 89.7 fL (ref 80.0–100.0)
Monocytes Absolute: 0.8 10*3/uL (ref 0.1–1.0)
Monocytes Relative: 9 %
Neutro Abs: 5.2 10*3/uL (ref 1.7–7.7)
Neutrophils Relative %: 60 %
Platelet Count: 403 10*3/uL — ABNORMAL HIGH (ref 150–400)
RBC: 4.35 MIL/uL (ref 3.87–5.11)
RDW: 12.7 % (ref 11.5–15.5)
WBC Count: 8.7 10*3/uL (ref 4.0–10.5)
nRBC: 0 % (ref 0.0–0.2)

## 2022-11-12 LAB — CMP (CANCER CENTER ONLY)
ALT: 17 U/L (ref 0–44)
AST: 13 U/L — ABNORMAL LOW (ref 15–41)
Albumin: 4.9 g/dL (ref 3.5–5.0)
Alkaline Phosphatase: 77 U/L (ref 38–126)
Anion gap: 9 (ref 5–15)
BUN: 10 mg/dL (ref 6–20)
CO2: 28 mmol/L (ref 22–32)
Calcium: 9.9 mg/dL (ref 8.9–10.3)
Chloride: 105 mmol/L (ref 98–111)
Creatinine: 0.82 mg/dL (ref 0.44–1.00)
GFR, Estimated: >60 mL/min (ref >60)
Glucose, Bld: 92 mg/dL (ref 70–99)
Potassium: 3.4 mmol/L — ABNORMAL LOW (ref 3.5–5.1)
Sodium: 142 mmol/L (ref 135–145)
Total Bilirubin: 0.3 mg/dL (ref <1.2)
Total Protein: 7.6 g/dL (ref 6.5–8.1)

## 2022-11-12 LAB — RETICULOCYTES
Immature Retic Fract: 2.8 % (ref 2.3–15.9)
RBC.: 4.27 MIL/uL (ref 3.87–5.11)
Retic Count, Absolute: 41 K/uL (ref 19.0–186.0)
Retic Ct Pct: 1 % (ref 0.4–3.1)

## 2022-11-12 LAB — FERRITIN: Ferritin: 14 ng/mL (ref 11–307)

## 2022-11-12 NOTE — Progress Notes (Signed)
Hematology and Oncology Follow Up Visit  Yesenia Harper [id number] 1985/06/15 37 y.o. 11/12/2022   Principle Diagnosis:  May-Thurner Syndrome Recurrent DVT in the left lower extremity Iron deficiency anemia    Current Therapy:   Xarelto 20 mg PO daily IV iron as indicated- Feraheme 510   Interim History: [name] is here today with her husband and daughter for follow-up.   Since her last visit she has had IV iron. She did well with the IV iron. Symptoms of fatigue, pica improved but have slowly returned. Still having heavy menstrual cycles. She is planning on getting a uterine ablation soon.   She also has long history of hematuria with large clots. She has seen nephrology and urology and this is stable  Her gums will occasionally bleed as well after brushing.  No petechiae.  No fever, chills, n/v, cough, rash, abdominal pain or changes in bowel or bladder habits.  She feels that she has a lot of fluid retention and notes weight gain over the last year.  Appetite comes and goes. She is doing her best to stay well hydrated.  No falls or syncope reported.   ECOG Performance Status: 1 - Symptomatic but completely ambulatory  Medications:  Allergies as of 11/12/2022       Reactions   Penicillins Itching, Rash   Patient feels itching in mouth.        Medication List        Accurate as of November 12, 2022  3:07 PM. If you have any questions, ask your nurse or doctor.          acetaminophen 500 MG tablet Commonly known as: TYLENOL Take by mouth.   ALPRAZolam 0.25 MG tablet Commonly known as: XANAX Take 0.25 mg by mouth 2 (two) times daily as needed for anxiety.   buPROPion 300 MG 24 hr tablet Commonly known as: WELLBUTRIN XL Take 300 mg by mouth.   cyanocobalamin 1000 MCG/ML injection Commonly known as: VITAMIN B12 Inject 1,000 mcg into the muscle every Wednesday.   etonogestrel 68 MG Impl implant Commonly known as: NEXPLANON Inject 1 each into the skin  once.   furosemide 20 MG tablet Commonly known as: LASIX Take 20 mg by mouth in the morning.   hydroxychloroquine 200 MG tablet Commonly known as: PLAQUENIL Take 400 mg by mouth daily.   levothyroxine 75 MCG tablet Commonly known as: SYNTHROID Take one tablet by mouth daily on Mondays, Tuesdays, Wednesdays, Thursdays and Fridays.  Take one and one-half tablet on Saturdays and Sundays. NEEDS APPOINTMENT FOR FURTHER REFILLS.   linaclotide 290 MCG Caps capsule Commonly known as: LINZESS Take 290 mcg by mouth daily.   liothyronine 5 MCG tablet Commonly known as: CYTOMEL Take 5 mcg by mouth daily.   MULTIVITAMIN ADULT PO Take 1 tablet by mouth in the morning.   norethindrone 5 MG tablet Commonly known as: AYGESTIN Take 1 tablet (5 mg total) by mouth daily.   Nurtec 75 MG Tbdp Generic drug: Rimegepant Sulfate Take 75 mg by mouth daily as needed for migraine.   ondansetron 8 MG disintegrating tablet Commonly known as: ZOFRAN-ODT TAKE 1 TABLET BY MOUTH EVERY 8 HOURS AS NEEDED FOR NAUSEA   Qulipta 60 MG Tabs Generic drug: Atogepant Take 60 mg by mouth daily.   Rexulti 1 MG Tabs tablet Generic drug: brexpiprazole Take 1 mg by mouth daily.   rivaroxaban 20 MG Tabs tablet Commonly known as: XARELTO Take 1 tablet (20 mg total) by mouth daily with supper.  What changed: Another medication with the same name was removed. Continue taking this medication, and follow the directions you see here. Changed by: Rushie Chestnut   spironolactone 25 MG tablet Commonly known as: ALDACTONE Take 25 mg by mouth in the morning.   Tiadylt ER 120 MG 24 hr capsule Generic drug: diltiazem Take 120 mg by mouth daily.   traZODone 100 MG tablet Commonly known as: DESYREL Take 100 mg by mouth at bedtime.   Vilazodone HCl 40 MG Tabs Commonly known as: Viibryd Take 1 tablet (40 mg total) by mouth daily.   Vitamin D (Ergocalciferol) 1.25 MG (50000 UNIT) Caps capsule Commonly known as:  DRISDOL Take 50,000 Units by mouth every Thursday.   zolpidem 10 MG tablet Commonly known as: AMBIEN Take 10 mg by mouth at bedtime as needed.        Allergies:  Allergies  Allergen Reactions   Penicillins Itching and Rash    Patient feels itching in mouth.    Past Medical History, Surgical history, Social history, and Family History were reviewed and updated.  Review of Systems: All other 10 point review of systems is negative.   Physical Exam:  height is 5\' 2"  (1.575 m) and weight is 177 lb (80.3 kg). Her oral temperature is 98 F (36.7 C). Her blood pressure is 118/89 and her pulse is 104 (abnormal). Her respiration is 18 and oxygen saturation is 100%.   Wt Readings from Last 3 Encounters:  11/12/22 177 lb (80.3 kg)  09/12/22 183 lb (83 kg)  09/05/22 183 lb (83 kg)    Ocular: Sclerae unicteric, pupils equal, round and reactive to light Ear-nose-throat: Oropharynx clear, dentition fair Lymphatic: No cervical or supraclavicular adenopathy Lungs no rales or rhonchi, good excursion bilaterally Heart regular rate and rhythm, no murmur appreciated Abd soft, nontender, positive bowel sounds MSK no focal spinal tenderness, no joint edema Neuro: non-focal, well-oriented, appropriate affect  Lab Results  Component Value Date   WBC 8.7 11/12/2022   HGB 12.7 11/12/2022   HCT 39.0 11/12/2022   MCV 89.7 11/12/2022   PLT 403 (H) 11/12/2022   Lab Results  Component Value Date   FERRITIN 220 08/02/2022   IRON 87 08/02/2022   TIBC 262 08/02/2022   UIBC 175 08/02/2022   IRONPCTSAT 33 (H) 08/02/2022   Lab Results  Component Value Date   RETICCTPCT 1.0 11/12/2022   RBC 4.35 11/12/2022   RBC 4.27 11/12/2022   No results found for: "KPAFRELGTCHN", "LAMBDASER", "KAPLAMBRATIO" No results found for: "IGGSERUM", "IGA", "IGMSERUM" No results found for: "TOTALPROTELP", "ALBUMINELP", "A1GS", "A2GS", "BETS", "BETA2SER", "GAMS", "MSPIKE", "SPEI"   Chemistry      Component  Value Date/Time   NA 142 11/12/2022 1421   NA 138 04/16/2022 0000   K 3.4 (L) 11/12/2022 1421   CL 105 11/12/2022 1421   CO2 28 11/12/2022 1421   BUN 10 11/12/2022 1421   BUN 13 04/16/2022 0000   CREATININE 0.82 11/12/2022 1421   CREATININE 0.78 05/12/2021 0000   GLU 102 04/16/2022 0000      Component Value Date/Time   CALCIUM 9.9 11/12/2022 1421   ALKPHOS 77 11/12/2022 1421   AST 13 (L) 11/12/2022 1421   ALT 17 11/12/2022 1421   BILITOT 0.3 11/12/2022 1421     Encounter Diagnoses  Name Primary?   Iron deficiency anemia due to chronic blood loss Yes   Primary hypercoagulable state (HCC)    May-Thurner syndrome     Impression and Plan: [name]  is a very pleasant 37 yo caucasian female with history recurrent left lower extremity DVT and [name]. Hyper coag panel was negative. She now appears to have IDA secondary to heavy menses/chronic hematuria. She has been treated with IV iron.    Continue follow up visits with her specialists CBC looks good today but iron labs are pending RTC 3 months APP, labs (CBC w/, CMP, ferritin, iron, retic)-[place]  Rushie Chestnut, PA-C 11/4/20243:07 PM

## 2022-11-13 DIAGNOSIS — R82998 Other abnormal findings in urine: Secondary | ICD-10-CM | POA: Diagnosis not present

## 2022-11-13 DIAGNOSIS — R339 Retention of urine, unspecified: Secondary | ICD-10-CM | POA: Diagnosis not present

## 2022-11-13 DIAGNOSIS — R310 Gross hematuria: Secondary | ICD-10-CM | POA: Diagnosis not present

## 2022-11-13 LAB — IRON AND IRON BINDING CAPACITY (CC-WL,HP ONLY)
Iron: 112 ug/dL (ref 28–170)
Saturation Ratios: 30 % (ref 10.4–31.8)
TIBC: 371 ug/dL (ref 250–450)
UIBC: 259 ug/dL (ref 148–442)

## 2022-11-15 ENCOUNTER — Other Ambulatory Visit: Payer: Self-pay | Admitting: Medical Oncology

## 2022-11-19 ENCOUNTER — Ambulatory Visit: Payer: Medicaid Other

## 2022-11-19 DIAGNOSIS — N8003 Adenomyosis of the uterus: Secondary | ICD-10-CM

## 2022-11-19 DIAGNOSIS — N946 Dysmenorrhea, unspecified: Secondary | ICD-10-CM | POA: Diagnosis not present

## 2022-11-19 DIAGNOSIS — N888 Other specified noninflammatory disorders of cervix uteri: Secondary | ICD-10-CM | POA: Diagnosis not present

## 2022-11-19 MED ORDER — GADOBUTROL 1 MMOL/ML IV SOLN
7.5000 mL | Freq: Once | INTRAVENOUS | Status: AC | PRN
  Administered 2022-11-19: 7.5 mL via INTRAVENOUS

## 2022-11-21 ENCOUNTER — Inpatient Hospital Stay: Payer: Medicaid Other

## 2022-11-21 VITALS — BP 103/72 | HR 105 | Temp 98.2°F | Resp 18

## 2022-11-21 DIAGNOSIS — F411 Generalized anxiety disorder: Secondary | ICD-10-CM | POA: Diagnosis not present

## 2022-11-21 DIAGNOSIS — N920 Excessive and frequent menstruation with regular cycle: Secondary | ICD-10-CM | POA: Diagnosis not present

## 2022-11-21 DIAGNOSIS — D500 Iron deficiency anemia secondary to blood loss (chronic): Secondary | ICD-10-CM | POA: Diagnosis not present

## 2022-11-21 DIAGNOSIS — F331 Major depressive disorder, recurrent, moderate: Secondary | ICD-10-CM | POA: Diagnosis not present

## 2022-11-21 DIAGNOSIS — F3131 Bipolar disorder, current episode depressed, mild: Secondary | ICD-10-CM | POA: Diagnosis not present

## 2022-11-21 DIAGNOSIS — D6859 Other primary thrombophilia: Secondary | ICD-10-CM | POA: Diagnosis not present

## 2022-11-21 DIAGNOSIS — Z86718 Personal history of other venous thrombosis and embolism: Secondary | ICD-10-CM | POA: Diagnosis not present

## 2022-11-21 DIAGNOSIS — Z7901 Long term (current) use of anticoagulants: Secondary | ICD-10-CM | POA: Diagnosis not present

## 2022-11-21 MED ORDER — SODIUM CHLORIDE 0.9 % IV SOLN: Freq: Once | INTRAVENOUS | Status: AC

## 2022-11-21 MED ORDER — SODIUM CHLORIDE 0.9 % IV SOLN
510.0000 mg | Freq: Once | INTRAVENOUS | Status: AC
  Administered 2022-11-21: 510 mg via INTRAVENOUS
  Filled 2022-11-21: qty 510

## 2022-11-21 NOTE — Patient Instructions (Signed)
 Ferumoxytol Injection What is this medication? FERUMOXYTOL (FER ue MOX i tol) treats low levels of iron in your body (iron deficiency anemia). Iron is a mineral that plays an important role in making red blood cells, which carry oxygen from your lungs to the rest of your body. This medicine may be used for other purposes; ask your health care provider or pharmacist if you have questions. COMMON BRAND NAME(S): Feraheme What should I tell my care team before I take this medication? They need to know if you have any of these conditions: Anemia not caused by low iron levels High levels of iron in the blood Magnetic resonance imaging (MRI) test scheduled An unusual or allergic reaction to iron, other medications, foods, dyes, or preservatives Pregnant or trying to get pregnant Breastfeeding How should I use this medication? This medication is injected into a vein. It is given by your care team in a hospital or clinic setting. Talk to your care team the use of this medication in children. Special care may be needed. Overdosage: If you think you have taken too much of this medicine contact a poison control center or emergency room at once. NOTE: This medicine is only for you. Do not share this medicine with others. What if I miss a dose? It is important not to miss your dose. Call your care team if you are unable to keep an appointment. What may interact with this medication? Other iron products This list may not describe all possible interactions. Give your health care provider a list of all the medicines, herbs, non-prescription drugs, or dietary supplements you use. Also tell them if you smoke, drink alcohol, or use illegal drugs. Some items may interact with your medicine. What should I watch for while using this medication? Visit your care team regularly. Tell your care team if your symptoms do not start to get better or if they get worse. You may need blood work done while you are taking this  medication. You may need to follow a special diet. Talk to your care team. Foods that contain iron include: whole grains/cereals, dried fruits, beans, or peas, leafy green vegetables, and organ meats (liver, kidney). What side effects may I notice from receiving this medication? Side effects that you should report to your care team as soon as possible: Allergic reactions--skin rash, itching, hives, swelling of the face, lips, tongue, or throat Low blood pressure--dizziness, feeling faint or lightheaded, blurry vision Shortness of breath Side effects that usually do not require medical attention (report to your care team if they continue or are bothersome): Flushing Headache Joint pain Muscle pain Nausea Pain, redness, or irritation at injection site This list may not describe all possible side effects. Call your doctor for medical advice about side effects. You may report side effects to FDA at 1-800-FDA-1088. Where should I keep my medication? This medication is given in a hospital or clinic. It will not be stored at home. NOTE: This sheet is a summary. It may not cover all possible information. If you have questions about this medicine, talk to your doctor, pharmacist, or health care provider.  2024 Elsevier/Gold Standard (2022-06-01 00:00:00)

## 2022-11-27 DIAGNOSIS — R310 Gross hematuria: Secondary | ICD-10-CM | POA: Diagnosis not present

## 2022-11-27 DIAGNOSIS — M5116 Intervertebral disc disorders with radiculopathy, lumbar region: Secondary | ICD-10-CM | POA: Diagnosis not present

## 2022-11-27 DIAGNOSIS — M5010 Cervical disc disorder with radiculopathy, unspecified cervical region: Secondary | ICD-10-CM | POA: Diagnosis not present

## 2022-11-27 DIAGNOSIS — G5603 Carpal tunnel syndrome, bilateral upper limbs: Secondary | ICD-10-CM | POA: Diagnosis not present

## 2022-11-27 DIAGNOSIS — Z79899 Other long term (current) drug therapy: Secondary | ICD-10-CM | POA: Diagnosis not present

## 2022-11-27 DIAGNOSIS — F3181 Bipolar II disorder: Secondary | ICD-10-CM | POA: Diagnosis not present

## 2022-11-27 DIAGNOSIS — G43719 Chronic migraine without aura, intractable, without status migrainosus: Secondary | ICD-10-CM | POA: Diagnosis not present

## 2022-11-27 DIAGNOSIS — E039 Hypothyroidism, unspecified: Secondary | ICD-10-CM | POA: Diagnosis not present

## 2022-11-27 DIAGNOSIS — Z7901 Long term (current) use of anticoagulants: Secondary | ICD-10-CM | POA: Diagnosis not present

## 2022-11-27 DIAGNOSIS — Z793 Long term (current) use of hormonal contraceptives: Secondary | ICD-10-CM | POA: Diagnosis not present

## 2022-11-27 DIAGNOSIS — G935 Compression of brain: Secondary | ICD-10-CM | POA: Diagnosis not present

## 2022-11-27 DIAGNOSIS — D500 Iron deficiency anemia secondary to blood loss (chronic): Secondary | ICD-10-CM | POA: Diagnosis not present

## 2022-11-27 DIAGNOSIS — Z9049 Acquired absence of other specified parts of digestive tract: Secondary | ICD-10-CM | POA: Diagnosis not present

## 2022-11-27 DIAGNOSIS — F419 Anxiety disorder, unspecified: Secondary | ICD-10-CM | POA: Diagnosis not present

## 2022-11-27 DIAGNOSIS — Z79890 Hormone replacement therapy (postmenopausal): Secondary | ICD-10-CM | POA: Diagnosis not present

## 2022-11-27 DIAGNOSIS — Z86718 Personal history of other venous thrombosis and embolism: Secondary | ICD-10-CM | POA: Diagnosis not present

## 2022-11-27 DIAGNOSIS — E669 Obesity, unspecified: Secondary | ICD-10-CM | POA: Diagnosis not present

## 2022-11-27 DIAGNOSIS — M3507 Sjogren syndrome with central nervous system involvement: Secondary | ICD-10-CM | POA: Diagnosis not present

## 2022-11-27 DIAGNOSIS — N2889 Other specified disorders of kidney and ureter: Secondary | ICD-10-CM | POA: Diagnosis not present

## 2022-11-27 HISTORY — PX: CYSTOSCOPY W/ RETROGRADES: SHX1426

## 2022-11-28 ENCOUNTER — Inpatient Hospital Stay: Payer: Medicaid Other

## 2022-11-28 VITALS — BP 96/77 | HR 95 | Temp 98.2°F | Resp 17

## 2022-11-28 DIAGNOSIS — D500 Iron deficiency anemia secondary to blood loss (chronic): Secondary | ICD-10-CM | POA: Diagnosis not present

## 2022-11-28 DIAGNOSIS — Z86718 Personal history of other venous thrombosis and embolism: Secondary | ICD-10-CM | POA: Diagnosis not present

## 2022-11-28 DIAGNOSIS — N920 Excessive and frequent menstruation with regular cycle: Secondary | ICD-10-CM | POA: Diagnosis not present

## 2022-11-28 DIAGNOSIS — D6859 Other primary thrombophilia: Secondary | ICD-10-CM | POA: Diagnosis not present

## 2022-11-28 DIAGNOSIS — Z7901 Long term (current) use of anticoagulants: Secondary | ICD-10-CM | POA: Diagnosis not present

## 2022-11-28 MED ORDER — SODIUM CHLORIDE 0.9 % IV SOLN
510.0000 mg | Freq: Once | INTRAVENOUS | Status: AC
  Administered 2022-11-28: 510 mg via INTRAVENOUS
  Filled 2022-11-28: qty 17

## 2022-11-28 MED ORDER — SODIUM CHLORIDE 0.9 % IV SOLN: Freq: Once | INTRAVENOUS | Status: AC

## 2022-11-28 NOTE — Patient Instructions (Signed)
 Ferumoxytol Injection What is this medication? FERUMOXYTOL (FER ue MOX i tol) treats low levels of iron in your body (iron deficiency anemia). Iron is a mineral that plays an important role in making red blood cells, which carry oxygen from your lungs to the rest of your body. This medicine may be used for other purposes; ask your health care provider or pharmacist if you have questions. COMMON BRAND NAME(S): Feraheme What should I tell my care team before I take this medication? They need to know if you have any of these conditions: Anemia not caused by low iron levels High levels of iron in the blood Magnetic resonance imaging (MRI) test scheduled An unusual or allergic reaction to iron, other medications, foods, dyes, or preservatives Pregnant or trying to get pregnant Breastfeeding How should I use this medication? This medication is injected into a vein. It is given by your care team in a hospital or clinic setting. Talk to your care team the use of this medication in children. Special care may be needed. Overdosage: If you think you have taken too much of this medicine contact a poison control center or emergency room at once. NOTE: This medicine is only for you. Do not share this medicine with others. What if I miss a dose? It is important not to miss your dose. Call your care team if you are unable to keep an appointment. What may interact with this medication? Other iron products This list may not describe all possible interactions. Give your health care provider a list of all the medicines, herbs, non-prescription drugs, or dietary supplements you use. Also tell them if you smoke, drink alcohol, or use illegal drugs. Some items may interact with your medicine. What should I watch for while using this medication? Visit your care team regularly. Tell your care team if your symptoms do not start to get better or if they get worse. You may need blood work done while you are taking this  medication. You may need to follow a special diet. Talk to your care team. Foods that contain iron include: whole grains/cereals, dried fruits, beans, or peas, leafy green vegetables, and organ meats (liver, kidney). What side effects may I notice from receiving this medication? Side effects that you should report to your care team as soon as possible: Allergic reactions--skin rash, itching, hives, swelling of the face, lips, tongue, or throat Low blood pressure--dizziness, feeling faint or lightheaded, blurry vision Shortness of breath Side effects that usually do not require medical attention (report to your care team if they continue or are bothersome): Flushing Headache Joint pain Muscle pain Nausea Pain, redness, or irritation at injection site This list may not describe all possible side effects. Call your doctor for medical advice about side effects. You may report side effects to FDA at 1-800-FDA-1088. Where should I keep my medication? This medication is given in a hospital or clinic. It will not be stored at home. NOTE: This sheet is a summary. It may not cover all possible information. If you have questions about this medicine, talk to your doctor, pharmacist, or health care provider.  2024 Elsevier/Gold Standard (2022-06-01 00:00:00)

## 2022-12-01 IMAGING — CR DG CHEST 2V
2 series · 2 of 2 positions shown · non-contrast
Comparison: Chest radiograph dated July 01, 2020

CLINICAL DATA: Shortness of breath chest pain.

EXAM:
CHEST - 2 VIEW

[w chest pa]
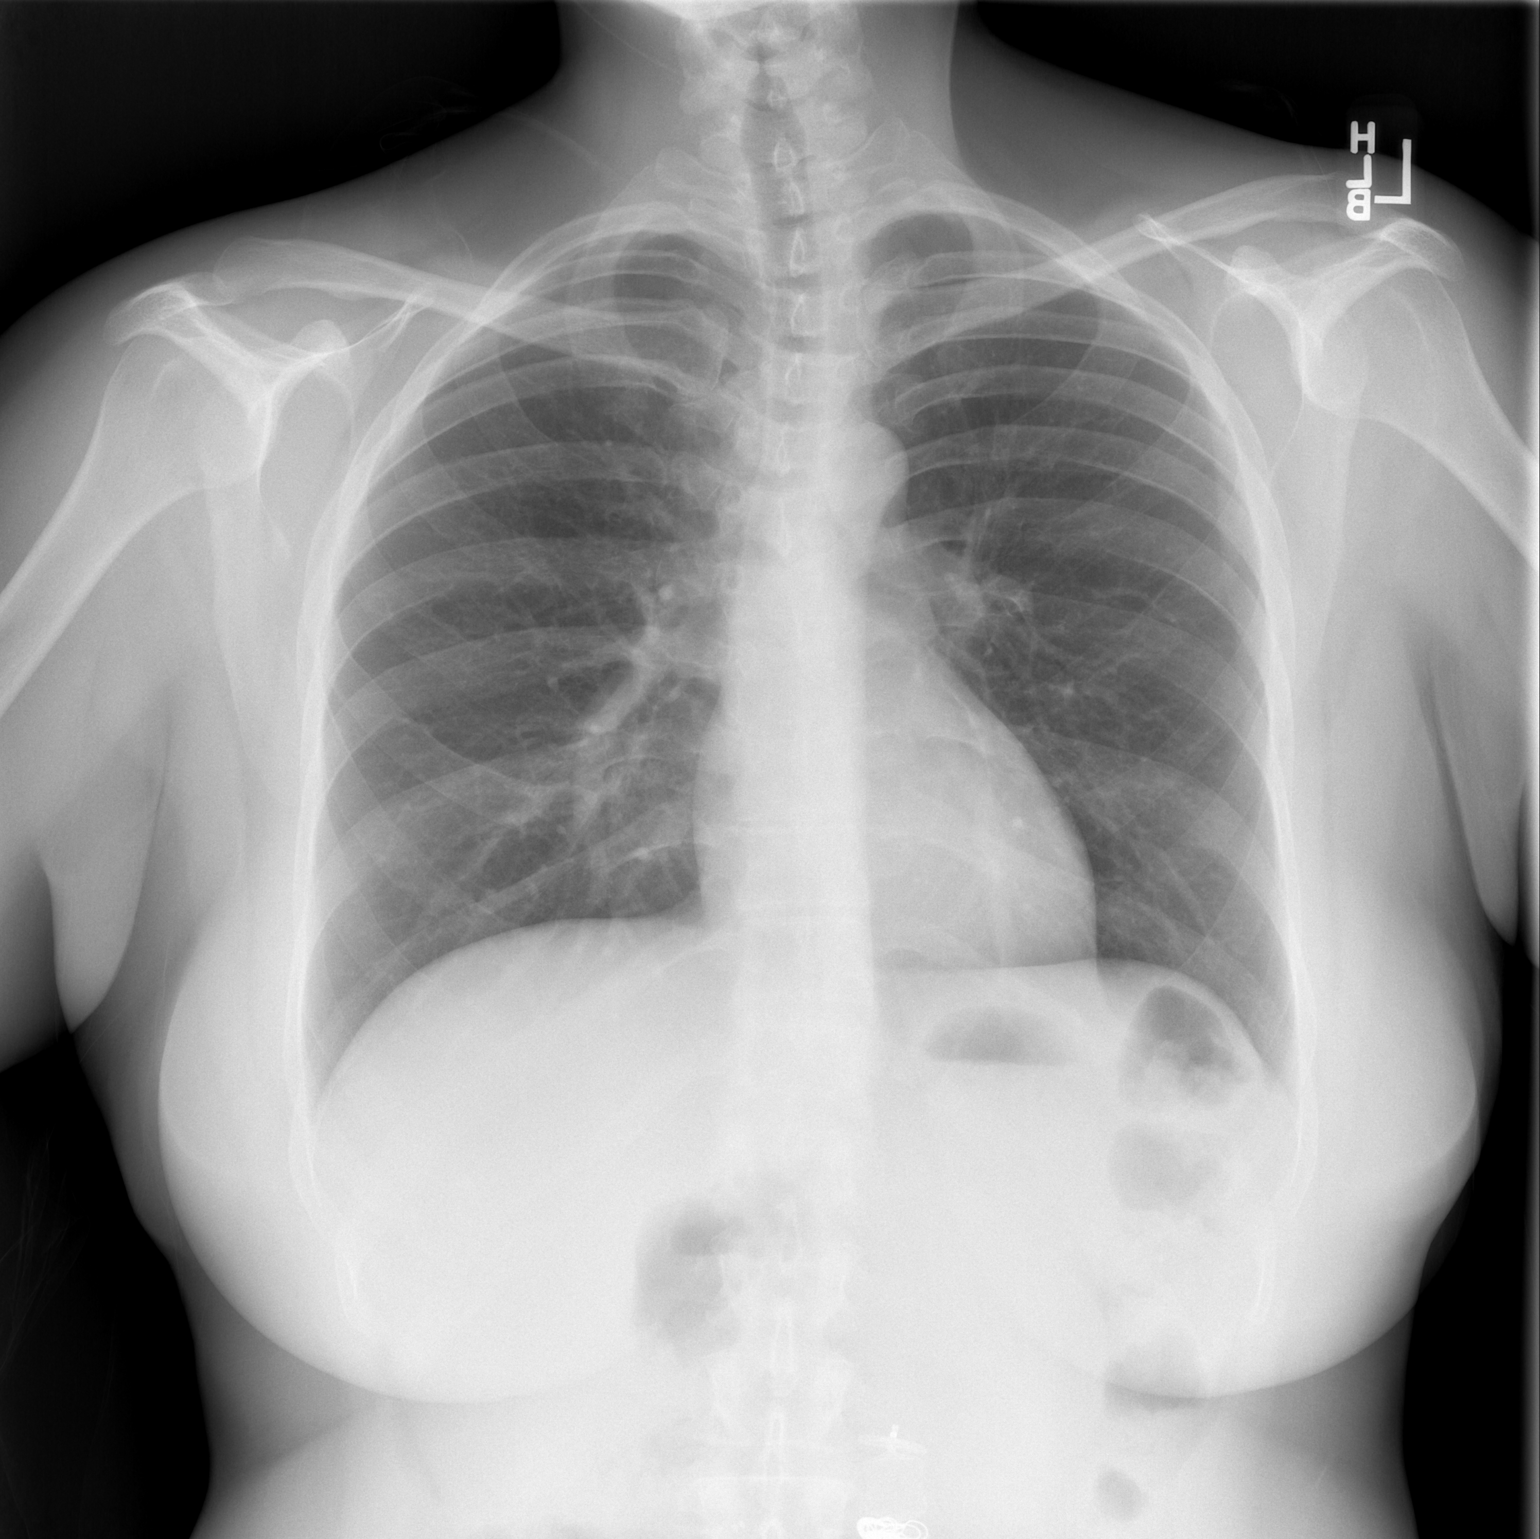

[w chest lat]
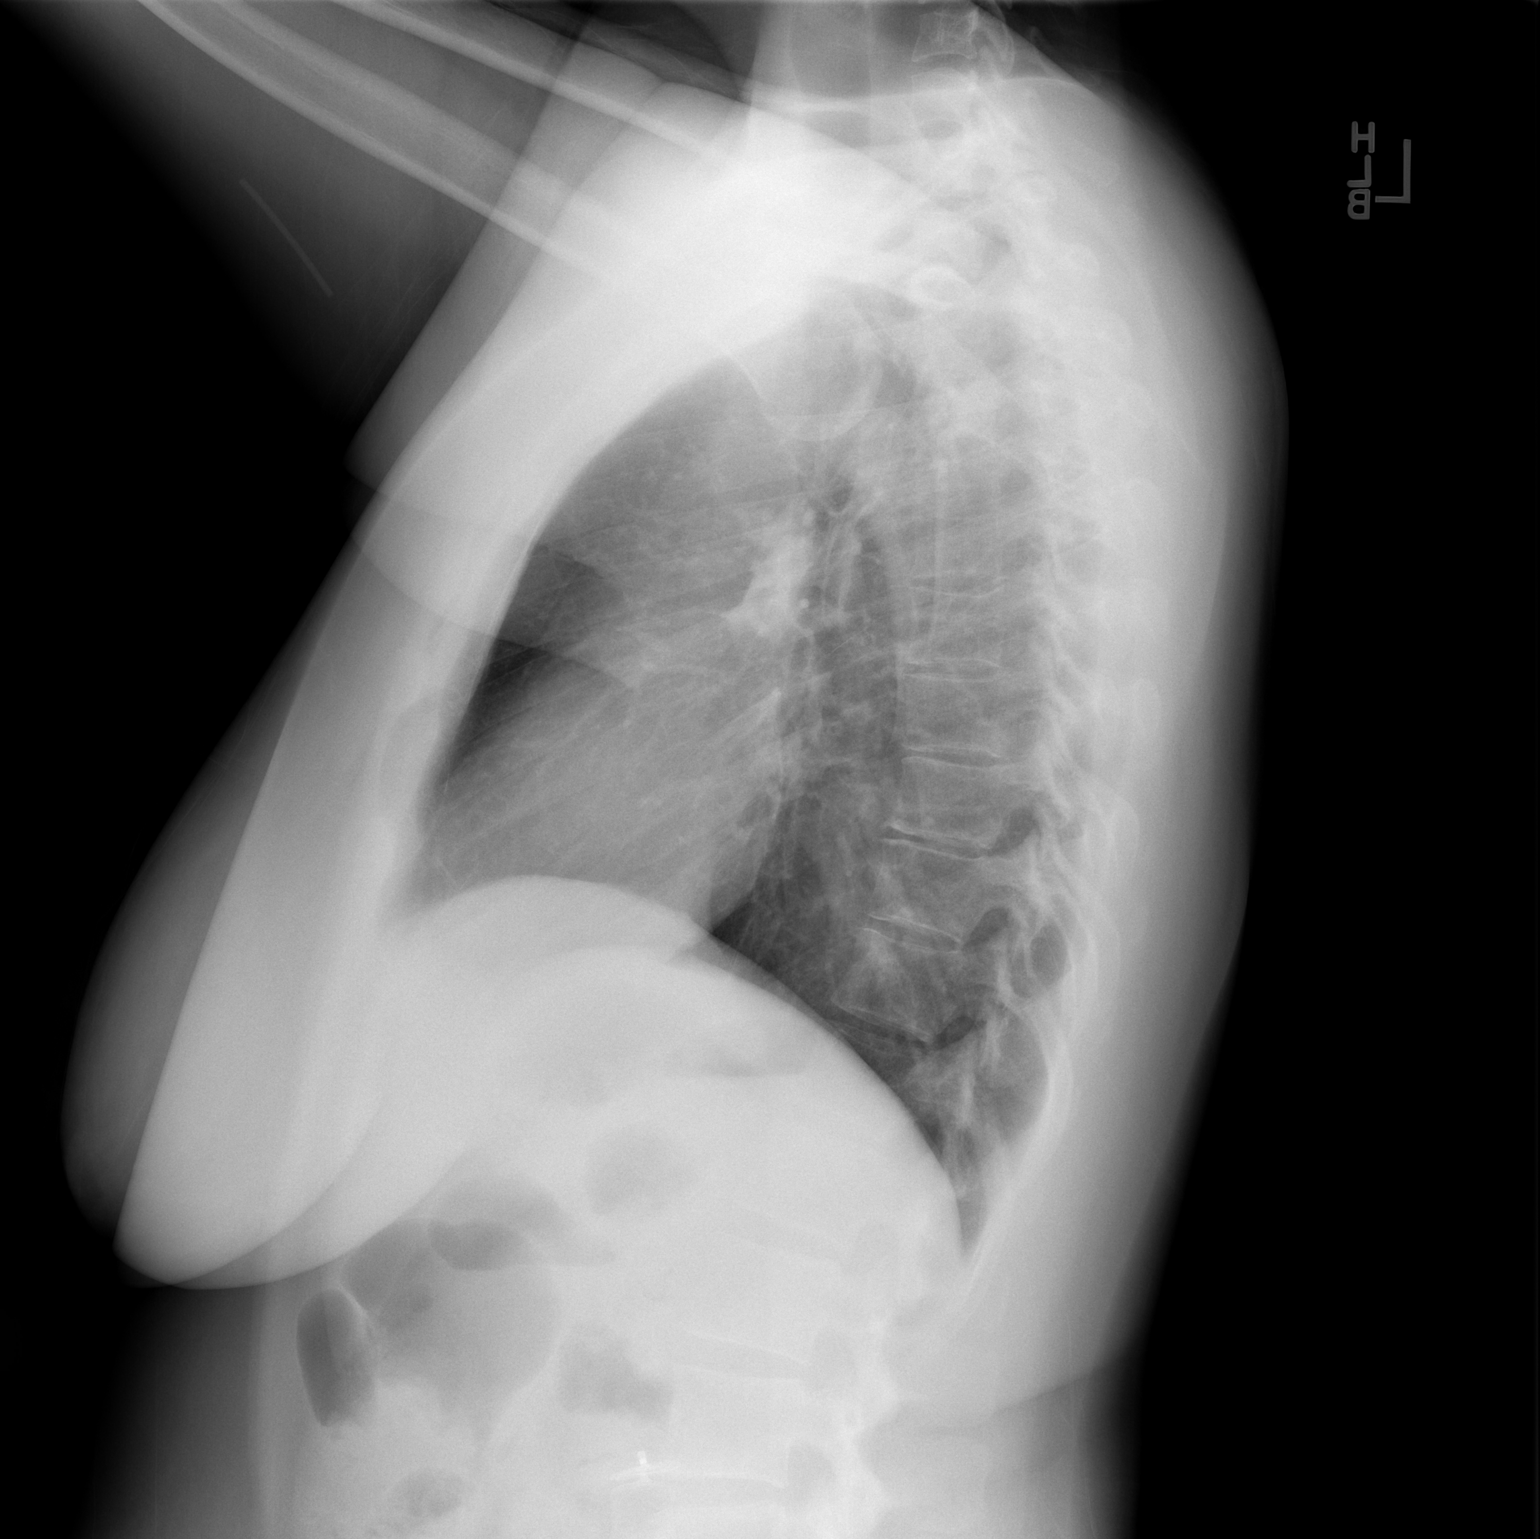

[2 of 2 positions shown; findings below may reference images not displayed]

FINDINGS: The heart size and mediastinal contours are within normal limits.
Both lungs are clear. The visualized skeletal structures are
unremarkable.
IMPRESSION: No active cardiopulmonary disease.

## 2022-12-05 ENCOUNTER — Other Ambulatory Visit: Payer: Self-pay | Admitting: Medical Genetics

## 2022-12-12 ENCOUNTER — Other Ambulatory Visit: Payer: Self-pay | Admitting: Medical Genetics

## 2022-12-24 DIAGNOSIS — F411 Generalized anxiety disorder: Secondary | ICD-10-CM | POA: Diagnosis not present

## 2022-12-24 DIAGNOSIS — F331 Major depressive disorder, recurrent, moderate: Secondary | ICD-10-CM | POA: Diagnosis not present

## 2022-12-24 DIAGNOSIS — F4312 Post-traumatic stress disorder, chronic: Secondary | ICD-10-CM | POA: Diagnosis not present

## 2022-12-24 DIAGNOSIS — F3131 Bipolar disorder, current episode depressed, mild: Secondary | ICD-10-CM | POA: Diagnosis not present

## 2022-12-25 ENCOUNTER — Inpatient Hospital Stay: Admission: RE | Admit: 2022-12-25 | Payer: Medicaid Other | Source: Ambulatory Visit

## 2022-12-29 DIAGNOSIS — G4700 Insomnia, unspecified: Secondary | ICD-10-CM | POA: Diagnosis not present

## 2022-12-29 DIAGNOSIS — M5417 Radiculopathy, lumbosacral region: Secondary | ICD-10-CM | POA: Diagnosis not present

## 2022-12-29 DIAGNOSIS — G43109 Migraine with aura, not intractable, without status migrainosus: Secondary | ICD-10-CM | POA: Diagnosis not present

## 2022-12-29 DIAGNOSIS — M5412 Radiculopathy, cervical region: Secondary | ICD-10-CM | POA: Diagnosis not present

## 2022-12-29 DIAGNOSIS — M545 Low back pain, unspecified: Secondary | ICD-10-CM | POA: Diagnosis not present

## 2022-12-29 DIAGNOSIS — G5603 Carpal tunnel syndrome, bilateral upper limbs: Secondary | ICD-10-CM | POA: Diagnosis not present

## 2022-12-29 DIAGNOSIS — M542 Cervicalgia: Secondary | ICD-10-CM | POA: Diagnosis not present

## 2023-01-03 ENCOUNTER — Ambulatory Visit
Admission: RE | Admit: 2023-01-03 | Discharge: 2023-01-03 | Disposition: A | Discharge status: Home / Self Care | Payer: Medicaid Other | Source: Ambulatory Visit | Attending: Obstetrics and Gynecology | Admitting: Obstetrics and Gynecology

## 2023-01-03 DIAGNOSIS — N8003 Adenomyosis of the uterus: Secondary | ICD-10-CM

## 2023-01-03 HISTORY — PX: IR RADIOLOGIST EVAL & MGMT: IMG5224

## 2023-01-03 NOTE — Consult Note (Signed)
Chief Complaint: Patient was seen in consultation today for menorrhagia at the request of Duncan,Paula  Referring Physician(s): Duncan,Paula  History of Present Illness: Yesenia Harper is a 37 y.o. female With a complex past medical history including [name] and pelvic congestion syndrome for which she was treated with bilateral ovarian vein embolization and stenting of the left iliac vein at [facility].  She is on chronic anticoagulation with Xarelto.  She is currently suffering with severe menorrhagia and dysmenorrhea.  Workup has revealed MRI findings highly suggestive of uterine adenomyosis.  She discussed the possibility of hysterectomy with her gynecologist.  However, due to her need for anticoagulation and her prior iliac vein stenting and ovarian vein embolization she is relatively high risk for hysterectomy.  Therefore, she presents today accompanied by her husband to discuss uterine artery embolization.  Her periods occur regularly.  Bleeding is severe with passage of large clots.  She is currently anemic.  Additionally, she describes severe cramping in the weeks prior to her cycle starting.  Her cramps are fairly disabling.  Past Medical History:  Diagnosis Date   Anxiety    Anxiety with depression 08/03/2019   Bilateral carpal tunnel syndrome 02/23/2011   Cervical radiculitis 09/17/2013   Chiari malformation type I (HCC) 04/28/2013   Cognitive impairment, mild, so stated 05/23/2014   Degenerative joint disease of cervical and lumbar spine 05/23/2014   Delivery of pregnancy by cesarean section    Depression    Dry cough 08/03/2019   Epidermal inclusion cyst 04/10/2012   Eye pain 04/24/2013   Fever of unknown origin (FUO) 11/05/2014   Fluid retention in legs 06/06/2021   Gastrointestinal symptoms 06/06/2021   Formatting of this note might be different from the original.     Last Assessment & Plan:  Formatting of this note might be different from  the original.   Hematuria 12/18/2019   History of C-section 05/12/2014   Formatting of this note might be different from the original. 2006 [facility] for [facility]   History of DVT (deep vein thrombosis) 07/05/2020   Hypothyroidism    Lumbar radiculopathy 09/17/2013   May-Thurner syndrome 12/13/2020   MDD (major depressive disorder), recurrent episode, mild (HCC) 04/04/2012   Migraine headache without aura 04/10/2012   Myopia of both eyes 04/24/2013   Neck pain 09/17/2013   Neuropathy, median nerve 09/17/2013   Pain in joint involving left lower leg 07/05/2020   Pain of left lower extremity 07/05/2020   Scoliosis 12/18/2019   Visual disturbance 04/24/2013    Past Surgical History:  Procedure Laterality Date   CESAREAN SECTION     x2   IR RADIOLOGIST EVAL & MGMT  08/23/2020   IR RADIOLOGIST EVAL & MGMT  05/30/2021   IR RADIOLOGIST EVAL & MGMT  10/26/2021   IR RADIOLOGIST EVAL & MGMT  01/03/2023   VISCERAL ANGIOGRAPHY N/A 10/16/2021   Procedure: MESENTRIC ANGIOGRAPHY;  Surgeon: Maeola Harman, MD;  Location: [facility] INVASIVE CV LAB;  Service: Cardiovascular;  Laterality: N/A;    Allergies: Penicillins  Medications: Prior to Admission medications   Medication Sig Start Date End Date Taking? Authorizing Provider  acetaminophen (TYLENOL) 500 MG tablet Take by mouth.   Yes [provider]  ALPRAZolam (XANAX) 0.25 MG tablet Take 0.25 mg by mouth 2 (two) times daily as needed for anxiety. 09/24/21  Yes [provider]  buPROPion (WELLBUTRIN XL) 300 MG 24 hr tablet Take 300 mg by mouth.   Yes  [provider]  cyanocobalamin (VITAMIN B12) 1000 MCG/ML injection Inject 1,000 mcg into the muscle every Wednesday.   Yes [provider]  etonogestrel (NEXPLANON) 68 MG IMPL implant Inject 1 each into the skin once.   Yes [provider]  furosemide (LASIX) 20 MG tablet Take 20 mg by mouth in the morning. 08/09/21  Yes [provider]   hydroxychloroquine (PLAQUENIL) 200 MG tablet Take 400 mg by mouth daily. 08/29/22  Yes [provider]  levothyroxine (SYNTHROID) 75 MCG tablet Take one tablet by mouth daily on Mondays, Tuesdays, Wednesdays, Thursdays and Fridays.  Take one and one-half tablet on Saturdays and Sundays. NEEDS APPOINTMENT FOR FURTHER REFILLS. 03/05/22  Yes Christen Butter, NP  linaclotide (LINZESS) 290 MCG CAPS capsule Take 290 mcg by mouth daily.   Yes [provider]  liothyronine (CYTOMEL) 5 MCG tablet Take 5 mcg by mouth daily. 08/20/22  Yes [provider]  Multiple Vitamin (MULTIVITAMIN ADULT PO) Take 1 tablet by mouth in the morning.   Yes [provider]  NURTEC 75 MG TBDP Take 75 mg by mouth daily as needed for migraine. 08/23/21  Yes [provider]  ondansetron (ZOFRAN-ODT) 8 MG disintegrating tablet TAKE 1 TABLET BY MOUTH EVERY 8 HOURS AS NEEDED FOR NAUSEA 10/19/21  Yes Cirigliano, Vito V, DO  QULIPTA 60 MG TABS Take 60 mg by mouth daily.   Yes [provider]  rivaroxaban (XARELTO) 20 MG TABS tablet Take 1 tablet (20 mg total) by mouth daily with supper. 09/12/22  Yes Maeola Harman, MD  spironolactone (ALDACTONE) 25 MG tablet Take 25 mg by mouth in the morning. 04/17/21  Yes [provider]  TIADYLT ER 120 MG 24 hr capsule Take 120 mg by mouth daily. 08/25/22  Yes [provider]  traZODone (DESYREL) 100 MG tablet Take 100 mg by mouth at bedtime.   Yes [provider]  Vilazodone HCl (VIIBRYD) 40 MG TABS Take 1 tablet (40 mg total) by mouth daily. 05/02/21  Yes Christen Butter, NP  Vitamin D, Ergocalciferol, (DRISDOL) 1.25 MG (50000 UNIT) CAPS capsule Take 50,000 Units by mouth every Thursday. 08/17/21  Yes [provider]  zolpidem (AMBIEN) 10 MG tablet Take 10 mg by mouth at bedtime as needed. 11/23/21  Yes [provider]  norethindrone (AYGESTIN) 5 MG tablet Take 1 tablet (5 mg total) by mouth daily. Patient  not taking: Reported on 01/03/2023 09/05/22   Milas Hock, MD  REXULTI 1 MG TABS tablet Take 1 mg by mouth daily. Patient not taking: Reported on 01/03/2023 09/09/22   [provider]     Family History  Problem Relation Age of Onset   Hypertension Mother    Diabetes Mother    Colon polyps Mother    Hypertension Father    Diabetes Father    Colon polyps Father    Diabetes Maternal Grandmother    Hypertension Maternal Grandmother    Stroke Maternal Grandmother    Diabetes Maternal Grandfather    Hypertension Maternal Grandfather    Stroke Maternal Grandfather    Diabetes Paternal Grandmother    Hypertension Paternal Grandmother    Stroke Paternal Grandmother    Diabetes Paternal Grandfather    Hypertension Paternal Grandfather    Stroke Paternal Grandfather    Colon cancer Neg Hx    Pancreatic cancer Neg Hx    Stomach cancer Neg Hx    Esophageal cancer Neg Hx    Liver cancer Neg Hx  Social History   Socioeconomic History   Marital status: Married    Spouse name: Not on file   Number of children: 2   Years of education: Not on file   Highest education level: Not on file  Occupational History   Not on file  Tobacco Use   Smoking status: Never   Smokeless tobacco: Never  Vaping Use   Vaping status: Never Used  Substance and Sexual Activity   Alcohol use: Not Currently   Drug use: Never   Sexual activity: Yes    Partners: Male    Birth control/protection: Implant  Other Topics Concern   Not on file  Social History Narrative   Not on file   Social Drivers of Health   Financial Resource Strain: Patient Declined (08/12/2022)   Received from [facility]   Overall Financial Resource Strain (CARDIA)    Difficulty of Paying Living Expenses: Patient declined  Food Insecurity: Patient Declined (08/12/2022)   Received from [facility]   Hunger Vital Sign    Worried About Running Out of Food in the Last Year: Patient declined    Ran Out of Food in the  Last Year: Patient declined  Transportation Needs: Patient Declined (08/12/2022)   Received [facility] - Transportation    Lack of Transportation (Medical): Patient declined    Lack of Transportation (Non-Medical): Patient declined  Physical Activity: Unknown (08/12/2022)   Received from [facility]   Exercise Vital Sign    Days of Exercise per Week: Patient declined    Minutes of Exercise per Session: Not on file  Stress: Patient Declined (08/12/2022)   Received from [facility] Occupational [facility] - Occupational Stress Questionnaire    Feeling of Stress : Patient declined  Social Connections: Unknown (05/08/2021)   Received from [facility]   Social Network    Social Network: Not on file    Review of Systems: A 12 point ROS discussed and pertinent positives are indicated in the HPI above.  All other systems are negative.  Review of Systems  Vital Signs: BP 120/84 (BP Location: Right Arm, Patient Position: Sitting, Cuff Size: Normal)   Pulse 82   Temp 98.5 F (36.9 C) (Oral)   Resp 16   SpO2 98%     Physical Exam Constitutional:      General: She is not in acute distress.    Appearance: Normal appearance.  HENT:     Head: Normocephalic and atraumatic.  Eyes:     General: No scleral icterus. Cardiovascular:     Rate and Rhythm: Normal rate.  Pulmonary:     Effort: Pulmonary effort is normal.  Abdominal:     General: There is no distension.     Palpations: Abdomen is soft.     Tenderness: There is no abdominal tenderness.  Skin:    General: Skin is warm and dry.  Neurological:     General: No focal deficit present.     Mental Status: She is alert and oriented to person, place, and time.  Psychiatric:        Mood and Affect: Mood normal.        Behavior: Behavior normal.       Imaging: IR Radiologist Eval & Mgmt Result Date: 01/03/2023 EXAM: NEW PATIENT OFFICE VISIT CHIEF COMPLAINT: SEE NOTE IN EPIC  HISTORY OF PRESENT ILLNESS: SEE NOTE IN EPIC REVIEW OF SYSTEMS: SEE NOTE IN EPIC PHYSICAL EXAMINATION: SEE NOTE  IN EPIC ASSESSMENT AND PLAN: SEE NOTE IN EPIC Electronically Signed   By: Malachy Moan M.D.   On: 01/03/2023 13:15    Labs:  CBC: Recent Labs    04/16/22 0000 06/29/22 1403 08/02/22 1301 11/12/22 1421  WBC 10.7 11.0* 9.8 8.7  HGB 11.1* 9.9* 12.6 12.7  HCT 32* 33.7* 40.1 39.0  PLT 540* 475* 372 403*    COAGS: No results for input(s): "INR", "APTT" in the last 8760 hours.  BMP: Recent Labs    04/16/22 0000 06/29/22 1403 08/02/22 1301 11/12/22 1421  NA 138 139 140 142  K 3.6 4.2 3.8 3.4*  CL 104 107 104 105  CO2 26* 24 28 28   GLUCOSE  --  91 90 92  BUN 13 11 9 10   CALCIUM  --  9.5 10.1 9.9  CREATININE 0.7 0.70 0.67 0.82  GFRNONAA  --  >60 >60 >60    LIVER FUNCTION TESTS: Recent Labs    04/16/22 0000 06/29/22 1403 08/02/22 1301 11/12/22 1421  BILITOT  --  0.1* 0.2* 0.3  AST 39* 13* 11* 13*  ALT 9 15 13 17   ALKPHOS 20* 86 80 77  PROT  --  6.9 7.2 7.6  ALBUMIN 4.2 4.3 4.7 4.9    TUMOR MARKERS: No results for input(s): "AFPTM", "CEA", "CA199", "CHROMGRNA" in the last 8760 hours.  Assessment and Plan:  37 year old female with severe menorrhagia resulting in anemia and requiring iron supplementations.  MRI evaluation demonstrates no evidence of uterine fibroids, however she does have diffuse thickening of the junctional zone highly concerning for uterine adenomyosis.  She is a relatively suboptimal surgical candidate given her history of prior May-Thurner syndrome with stent placement and bilateral ovarian vein embolization.  She is an acceptable candidate for uterine artery embolization which is a widely recognized treatment for adenomyosis.  I explained the procedure in detail as well as the risks, benefits and alternatives.  Time was given for questions.  She understands and wishes to proceed.  1.)  Please schedule for outpatient superior  hypogastric nerve block and uterine artery embolization.  Thank you for this interesting consult.  I greatly enjoyed meeting Yesenia Harper and look forward to participating in their care.  A copy of this report was sent to the requesting provider on this date.  Electronically Signed: Sterling Big 01/03/2023, 3:05 PM   I spent a total of  40 Minutes  in face to face in clinical consultation, greater than 50% of which was counseling/coordinating care for uterine adenomyosis

## 2023-01-07 ENCOUNTER — Other Ambulatory Visit: Payer: Self-pay | Admitting: Interventional Radiology

## 2023-01-07 DIAGNOSIS — N8003 Adenomyosis of the uterus: Secondary | ICD-10-CM

## 2023-01-10 ENCOUNTER — Other Ambulatory Visit (HOSPITAL_COMMUNITY): Payer: Medicaid Other

## 2023-01-16 ENCOUNTER — Encounter (HOSPITAL_COMMUNITY): Payer: Medicaid Other

## 2023-01-16 ENCOUNTER — Ambulatory Visit: Payer: Medicaid Other | Admitting: Vascular Surgery

## 2023-01-17 ENCOUNTER — Other Ambulatory Visit (HOSPITAL_COMMUNITY): Payer: Medicaid Other | Attending: Medical Genetics

## 2023-01-21 DIAGNOSIS — F3131 Bipolar disorder, current episode depressed, mild: Secondary | ICD-10-CM | POA: Diagnosis not present

## 2023-01-21 DIAGNOSIS — F411 Generalized anxiety disorder: Secondary | ICD-10-CM | POA: Diagnosis not present

## 2023-01-21 DIAGNOSIS — F4312 Post-traumatic stress disorder, chronic: Secondary | ICD-10-CM | POA: Diagnosis not present

## 2023-01-21 DIAGNOSIS — F331 Major depressive disorder, recurrent, moderate: Secondary | ICD-10-CM | POA: Diagnosis not present

## 2023-01-23 ENCOUNTER — Other Ambulatory Visit (HOSPITAL_COMMUNITY)
Admission: RE | Admit: 2023-01-23 | Discharge: 2023-01-23 | Disposition: A | Discharge status: Home / Self Care | Payer: Self-pay | Source: Ambulatory Visit | Attending: Medical Genetics | Admitting: Medical Genetics

## 2023-01-23 ENCOUNTER — Encounter: Payer: Self-pay | Admitting: Vascular Surgery

## 2023-01-23 ENCOUNTER — Ambulatory Visit (HOSPITAL_COMMUNITY)
Admission: RE | Admit: 2023-01-23 | Discharge: 2023-01-23 | Disposition: A | Discharge status: Home / Self Care | Payer: Medicaid Other | Source: Ambulatory Visit | Attending: Vascular Surgery | Admitting: Vascular Surgery

## 2023-01-23 ENCOUNTER — Ambulatory Visit (INDEPENDENT_AMBULATORY_CARE_PROVIDER_SITE_OTHER): Payer: Medicaid Other | Admitting: Vascular Surgery

## 2023-01-23 VITALS — BP 108/78 | HR 100 | Temp 98.4°F | Resp 20 | Ht 62.0 in | Wt 171.5 lb

## 2023-01-23 DIAGNOSIS — I774 Celiac artery compression syndrome: Secondary | ICD-10-CM | POA: Diagnosis not present

## 2023-01-23 DIAGNOSIS — K551 Chronic vascular disorders of intestine: Secondary | ICD-10-CM | POA: Insufficient documentation

## 2023-01-23 DIAGNOSIS — I871 Compression of vein: Secondary | ICD-10-CM | POA: Diagnosis not present

## 2023-01-23 NOTE — Progress Notes (Signed)
 Patient ID: Yesenia Harper, female   DOB: Oct 16, 1985, 38 y.o.   MRN: [id number]  Reason for Consult: Follow-up   Referred by Cherre Cornish, NP  Subjective:     HPI:  Yesenia Harper is a 38 y.o. female history of treatment of May-Thurner syndrome with stenting performed at [facility] via interventional radiology.  She states that she has lost 25 pounds mostly because of pain after eating.  She has had endoscopy in the past without any abnormalities.  Her gallbladder is out.  She remains on Xarelto  for history of May-Thurner syndrome.  She does have history of medial arcuate ligament release.  Past Medical History:  Diagnosis Date   Anxiety    Anxiety with depression 08/03/2019   Bilateral carpal tunnel syndrome 02/23/2011   Cervical radiculitis 09/17/2013   Chiari malformation type I (HCC) 04/28/2013   Cognitive impairment, mild, so stated 05/23/2014   Degenerative joint disease of cervical and lumbar spine 05/23/2014   Delivery of pregnancy by cesarean section    Depression    Dry cough 08/03/2019   Epidermal inclusion cyst 04/10/2012   Eye pain 04/24/2013   Fever of unknown origin (FUO) 11/05/2014   Fluid retention in legs 06/06/2021   Gastrointestinal symptoms 06/06/2021   Formatting of this note might be different from the original.     Last Assessment & Plan:  Formatting of this note might be different from the original.   Hematuria 12/18/2019   History of C-section 05/12/2014   Formatting of this note might be different from the original. 2006 LTCS for breech & NRFHR   History of DVT (deep vein thrombosis) 07/05/2020   Hypothyroidism    Lumbar radiculopathy 09/17/2013   May-Thurner syndrome 12/13/2020   MDD (major depressive disorder), recurrent episode, mild (HCC) 04/04/2012   Migraine headache without aura 04/10/2012   Myopia of both eyes 04/24/2013   Neck pain 09/17/2013   Neuropathy, median nerve 09/17/2013   Pain in joint involving left lower leg 07/05/2020   Pain of left lower  extremity 07/05/2020   Scoliosis 12/18/2019   Visual disturbance 04/24/2013   Family History  Problem Relation Age of Onset   Hypertension Mother    Diabetes Mother    Colon polyps Mother    Hypertension Father    Diabetes Father    Colon polyps Father    Diabetes Maternal Grandmother    Hypertension Maternal Grandmother    Stroke Maternal Grandmother    Diabetes Maternal Grandfather    Hypertension Maternal Grandfather    Stroke Maternal Grandfather    Diabetes Paternal Grandmother    Hypertension Paternal Grandmother    Stroke Paternal Grandmother    Diabetes Paternal Grandfather    Hypertension Paternal Grandfather    Stroke Paternal Grandfather    Colon cancer Neg Hx    Pancreatic cancer Neg Hx    Stomach cancer Neg Hx    Esophageal cancer Neg Hx    Liver cancer Neg Hx    Past Surgical History:  Procedure Laterality Date   CESAREAN SECTION     x2   IR RADIOLOGIST EVAL & MGMT  08/23/2020   IR RADIOLOGIST EVAL & MGMT  05/30/2021   IR RADIOLOGIST EVAL & MGMT  10/26/2021   IR RADIOLOGIST EVAL & MGMT  01/03/2023   VISCERAL ANGIOGRAPHY N/A 10/16/2021   Procedure: MESENTRIC ANGIOGRAPHY;  Surgeon: Adine Hoof, MD;  Location: [facility];  Service: Cardiovascular;  Laterality: N/A;    Short Social History:  Social History   Tobacco Use   Smoking status: Never   Smokeless tobacco: Never  Substance Use Topics   Alcohol use: Not Currently    Allergies  Allergen Reactions   Penicillins Itching and Rash    Patient feels itching in mouth.    Current Outpatient Medications  Medication Sig Dispense Refill   acetaminophen  (TYLENOL ) 500 MG tablet Take by mouth.     ALPRAZolam  (XANAX ) 0.25 MG tablet Take 0.25 mg by mouth 2 (two) times daily as needed for anxiety.     buPROPion  (WELLBUTRIN  XL) 300 MG 24 hr tablet Take 300 mg by mouth.     cyanocobalamin  (VITAMIN B12) 1000 MCG/ML injection Inject 1,000 mcg into the muscle every Wednesday.      etonogestrel  (NEXPLANON ) 68 MG IMPL implant Inject 1 each into the skin once.     furosemide  (LASIX ) 20 MG tablet Take 20 mg by mouth in the morning.     hydroxychloroquine  (PLAQUENIL ) 200 MG tablet Take 400 mg by mouth daily.     levothyroxine  (SYNTHROID ) 75 MCG tablet Take one tablet by mouth daily on Mondays, Tuesdays, Wednesdays, Thursdays and Fridays.  Take one and one-half tablet on Saturdays and Sundays. NEEDS APPOINTMENT FOR FURTHER REFILLS. 102 tablet 0   linaclotide  (LINZESS ) 290 MCG CAPS capsule Take 290 mcg by mouth daily.     liothyronine (CYTOMEL) 5 MCG tablet Take 5 mcg by mouth daily.     Multiple Vitamin (MULTIVITAMIN ADULT PO) Take 1 tablet by mouth in the morning.     norethindrone  (AYGESTIN ) 5 MG tablet Take 1 tablet (5 mg total) by mouth daily. 30 tablet 2   NURTEC 75 MG TBDP Take 75 mg by mouth daily as needed for migraine.     ondansetron  (ZOFRAN -ODT) 8 MG disintegrating tablet TAKE 1 TABLET BY MOUTH EVERY 8 HOURS AS NEEDED FOR NAUSEA 60 tablet 0   QULIPTA  60 MG TABS Take 60 mg by mouth daily.     REXULTI 1 MG TABS tablet Take 1 mg by mouth daily.     rivaroxaban  (XARELTO ) 20 MG TABS tablet Take 1 tablet (20 mg total) by mouth daily with supper. 30 tablet 11   spironolactone  (ALDACTONE ) 25 MG tablet Take 25 mg by mouth in the morning.     TIADYLT  ER 120 MG 24 hr capsule Take 120 mg by mouth daily.     traZODone (DESYREL) 100 MG tablet Take 100 mg by mouth at bedtime.     Vilazodone  HCl (VIIBRYD ) 40 MG TABS Take 1 tablet (40 mg total) by mouth daily. 90 tablet 1   Vitamin D, Ergocalciferol, (DRISDOL) 1.25 MG (50000 UNIT) CAPS capsule Take 50,000 Units by mouth every Thursday.     zolpidem  (AMBIEN ) 10 MG tablet Take 10 mg by mouth at bedtime as needed.     No current facility-administered medications for this visit.    Review of Systems  Constitutional: Positive for unexpected weight change.  HENT: HENT negative.  Eyes: Eyes negative.  Respiratory: Respiratory  negative.  Cardiovascular: Cardiovascular negative.  GI: Positive for abdominal pain.  Musculoskeletal: Musculoskeletal negative.  Skin: Skin negative.       Blue lips Neurological: Neurological negative. Hematologic: Hematologic/lymphatic negative.  Psychiatric: Psychiatric negative.        Objective:  Objective   Vitals:   01/23/23 0941  BP: 108/78  Pulse: 100  Resp: 20  Temp: 98.4 F (36.9 C)  SpO2: 97%  Weight: 171 lb 8 oz (77.8 kg)  Height: 5'  2" (1.575 m)   Body mass index is 31.37 kg/m.  Physical Exam HENT:     Head: Normocephalic.     Nose: Nose normal.  Eyes:     Pupils: Pupils are equal, round, and reactive to light.  Cardiovascular:     Rate and Rhythm: Normal rate.     Pulses: Normal pulses.  Pulmonary:     Effort: Pulmonary effort is normal.  Abdominal:     General: Abdomen is flat.  Musculoskeletal:     Cervical back: Normal range of motion and neck supple.     Right lower leg: No edema.     Left lower leg: No edema.  Skin:    General: Skin is warm.     Capillary Refill: Capillary refill takes less than 2 seconds.  Neurological:     General: No focal deficit present.     Mental Status: She is alert.  Psychiatric:        Mood and Affect: Mood normal.        Thought Content: Thought content normal.     Data: Duplex Findings:  +----------------------+--------+--------+------+--------+  Mesenteric           PSV cm/sEDV cm/sPlaqueComments  +----------------------+--------+--------+------+--------+  Aorta at Celiac         133                           +----------------------+--------+--------+------+--------+  Aorta Prox              102                           +----------------------+--------+--------+------+--------+  Aorta Mid                72                           +----------------------+--------+--------+------+--------+  Celiac Artery Origin    124                            +----------------------+--------+--------+------+--------+  Celiac Artery Proximal  191                           +----------------------+--------+--------+------+--------+  SMA Origin              162                           +----------------------+--------+--------+------+--------+  SMA Proximal            243                           +----------------------+--------+--------+------+--------+  SMA Mid                 112                           +----------------------+--------+--------+------+--------+  Splenic                143                           +----------------------+--------+--------+------+--------+           Summary:  Mesenteric:  Normal Celiac artery and Superior Mesenteric artery findings where  visualized;  however, visualization of the abdominal vasculature was limited due to  overlying  bowel gas.      Assessment/Plan:     38 year old female with the above-noted vascular issues and a history of celiac artery compression release.  She also had interventional radiology treatment of May-Thurner and continues on Xarelto .  She is scheduled for uterine artery embolization with interventional radiology currently.  Given normal celiac and SMA arteries on exam today I would not recommend any vascular intervention she can see me on an as-needed basis.     Adine Hoof MD [facility]

## 2023-01-25 NOTE — Progress Notes (Signed)
Spoke with patient to review pre-UAE information, including stopping Xarelto on Sunday and Monday and Tuesday (1/19-1/21/25). She states an understanding to have nothing to eat or drink after midnight 01/28/23, to have a driver, to shave/prep her groin area and to arrive at 0730 for Vancomycin. The following medications were called in [facility] her [facility] in [place]: Naproxen 500 mg twice a day for 7 days. Colace 100 mg twice a day for 7 days. Zofran 8mg  every 8 hours for three days and then as needed for nausea/vomiting. Phenergan 12.5 mg every four hours as needed for nausea/vomiting. She is aware her driver needs to come into our office with her the day of her [place] to pick up a prescription for Percocet 7.5/325mg  every 4 hours for 5 days for pain. The driver will get this filled while she is having her procedure.  Questions were answered, including confirmation we will place a foley catheter into her bladder prior to the procedure start.

## 2023-01-29 ENCOUNTER — Ambulatory Visit
Admission: RE | Admit: 2023-01-29 | Discharge: 2023-01-29 | Disposition: A | Discharge status: Home / Self Care | Payer: Medicaid Other | Source: Ambulatory Visit | Attending: Interventional Radiology | Admitting: Interventional Radiology

## 2023-01-29 ENCOUNTER — Encounter: Payer: Self-pay | Admitting: Interventional Radiology

## 2023-01-29 ENCOUNTER — Other Ambulatory Visit: Payer: Self-pay | Admitting: Interventional Radiology

## 2023-01-29 ENCOUNTER — Encounter: Payer: Self-pay | Admitting: Family

## 2023-01-29 DIAGNOSIS — N8003 Adenomyosis of the uterus: Secondary | ICD-10-CM

## 2023-01-29 DIAGNOSIS — I459 Conduction disorder, unspecified: Secondary | ICD-10-CM

## 2023-01-29 DIAGNOSIS — G8918 Other acute postprocedural pain: Secondary | ICD-10-CM

## 2023-01-29 HISTORY — PX: IR EMBO TUMOR ORGAN ISCHEMIA INFARCT INC GUIDE ROADMAPPING: IMG5449

## 2023-01-29 HISTORY — PX: IR US GUIDE VASC ACCESS RIGHT: IMG2390

## 2023-01-29 HISTORY — PX: IR FLUORO GUIDED NEEDLE PLC ASPIRATION/INJECTION LOC: IMG2395

## 2023-01-29 HISTORY — PX: IR ANGIOGRAM SELECTIVE EACH ADDITIONAL VESSEL: IMG667

## 2023-01-29 HISTORY — PX: IR ANGIOGRAM PELVIS SELECTIVE OR SUPRASELECTIVE: IMG661

## 2023-01-29 MED ORDER — ONDANSETRON HCL 4 MG/2ML IJ SOLN
8.0000 mg | Freq: Once | INTRAMUSCULAR | Status: AC
  Administered 2023-01-29: 8 mg via INTRAVENOUS

## 2023-01-29 MED ORDER — ACETAMINOPHEN 10 MG/ML IV SOLN
1000.0000 mg | Freq: Once | INTRAVENOUS | Status: AC
  Administered 2023-01-29: 1000 mg via INTRAVENOUS

## 2023-01-29 MED ORDER — DEXAMETHASONE SODIUM PHOSPHATE 10 MG/ML IJ SOLN
10.0000 mg | Freq: Once | INTRAMUSCULAR | Status: AC
  Administered 2023-01-29: 10 mg via INTRAVENOUS

## 2023-01-29 MED ORDER — FENTANYL CITRATE (PF) 100 MCG/2ML IJ SOLN
INTRAMUSCULAR | Status: AC | PRN
  Administered 2023-01-29: 25 ug via INTRAVENOUS
  Administered 2023-01-29: 50 ug via INTRAVENOUS
  Administered 2023-01-29 (×2): 25 ug via INTRAVENOUS
  Administered 2023-01-29 (×2): 50 ug via INTRAVENOUS
  Administered 2023-01-29: 25 ug via INTRAVENOUS

## 2023-01-29 MED ORDER — KETOROLAC TROMETHAMINE 30 MG/ML IJ SOLN
30.0000 mg | Freq: Once | INTRAMUSCULAR | Status: AC
  Administered 2023-01-29: 30 mg via INTRAVENOUS

## 2023-01-29 MED ORDER — FENTANYL CITRATE PF 50 MCG/ML IJ SOSY: 25.0000 ug | PREFILLED_SYRINGE | INTRAMUSCULAR | Status: DC | PRN

## 2023-01-29 MED ORDER — MIDAZOLAM HCL 2 MG/2ML IJ SOLN: 1.0000 mg | INTRAMUSCULAR | Status: DC | PRN

## 2023-01-29 MED ORDER — PROMETHAZINE HCL 12.5 MG PO TABS
12.5000 mg | ORAL_TABLET | Freq: Once | ORAL | Status: AC
Start: 2023-01-29 — End: 2023-01-29
  Administered 2023-01-29: 25 mg via ORAL

## 2023-01-29 MED ORDER — PANTOPRAZOLE SODIUM 40 MG IV SOLR
40.0000 mg | Freq: Once | INTRAVENOUS | Status: AC
Start: 2023-01-29 — End: 2023-01-29
  Administered 2023-01-29: 40 mg via INTRAVENOUS

## 2023-01-29 MED ORDER — HYDROMORPHONE HCL 1 MG/ML IJ SOLN
1.0000 mg | Freq: Once | INTRAMUSCULAR | Status: AC
  Administered 2023-01-29: 1 mg via INTRAVENOUS

## 2023-01-29 MED ORDER — IOPAMIDOL (ISOVUE-300) INJECTION 61%
130.0000 mL | Freq: Once | INTRAVENOUS | Status: AC | PRN
  Administered 2023-01-29: 130 mL via INTRA_ARTERIAL

## 2023-01-29 MED ORDER — VANCOMYCIN HCL IN DEXTROSE 1-5 GM/200ML-% IV SOLN
1000.0000 mg | INTRAVENOUS | Status: AC
  Administered 2023-01-29: 1000 mg via INTRAVENOUS

## 2023-01-29 MED ORDER — KETOROLAC TROMETHAMINE 60 MG/2ML IM SOLN
30.0000 mg | Freq: Once | INTRAMUSCULAR | Status: AC
  Administered 2023-01-29: 30 mg via INTRAMUSCULAR

## 2023-01-29 MED ORDER — MIDAZOLAM HCL 2 MG/2ML IJ SOLN
INTRAMUSCULAR | Status: AC | PRN
  Administered 2023-01-29 (×2): .5 mg via INTRAVENOUS
  Administered 2023-01-29 (×2): 1 mg via INTRAVENOUS
  Administered 2023-01-29 (×2): .5 mg via INTRAVENOUS
  Administered 2023-01-29: 1 mg via INTRAVENOUS

## 2023-01-29 MED ORDER — SODIUM CHLORIDE 0.9 % IV SOLN: INTRAVENOUS | Status: DC

## 2023-01-29 NOTE — Progress Notes (Signed)
Pt back in nursing recovery area at 1115. Pt still drowsy from procedure but will wake up when spoken to. Pt follows commands, talks in complete sentences and has no complaints at this time. Pt will remain in nursing station until discharge.

## 2023-01-29 NOTE — Discharge Instructions (Signed)
Uterine Artery Embolization After Care   What can I expect after the procedure?   After the procedure, it is common to have:   Mild pain or discomfort at the arterial entry site   Uterine Cramping   Cramps can vary in strength from what you would consider to be a bad menstrual cycle all the way up to as severe as labor pains.   The cramping is typically the most severe the afternoon and evening the day of the procedure and begin to improve the next day and each day thereafter.   Vaginal bleeding. Your cycle may become irregular the first several months.   Vaginal discharge. We recommend you wear a panty liner for first 4-6 weeks following your procedure.    Nausea and vomiting.      Follow these instructions at home:   Medicines   Take your medicine exactly as told, at the same time every day. This is vital to helping you with a smooth recovery.   Zofran (ondansetron) is used to prevent nausea before it starts.  You will have a prescription to take 8 mg of this medication every 8 hours.  You should take this even if you don't feel nauseated because it is meant to prevent the nausea from occurring.  Once you get nauseated and start to vomit, you may not be able to keep your other medicines down and your pain can be left untreated.  You can take this with every other dose of the oxycodone/acetaminophen   Naproxen is a non-steroidal anti-inflammatory medicine called and is critical in keeping your inflammation and pain under control.  You must take this every 12 hrs. We recommend 8 am and 8 pm.    Percocet (oxycodone/acetaminophen) is a combination narcotic pain medication with Tylenol.  This is to help with your pain.  Take it every 4 hours regardless of your pain level the first 2 days.  Set an alarm to wake up so you don't miss a dose overnight and get behind on your pain control.  After the first 48 hrs, if your pain is minimal you can take only as needed.    Colace (docusate  sodium) is a stool softener to help prevent constipation.  The pain medications often cause constipation which can be particularly uncomfortable after UAE.  We recommend you take this at 8 am and 8 pm with your naproxen.    Phenergan (promethazine) is another medication for nausea.  If you still feel sick to your stomach or vomit despite the Zofran (ondansetron) take this medicine every 8 hours as needed.    Incision care   Leave your bandage on for 24 hrs and keep the area dry   You may remove the bandage after 24 hrs and then shower as normal.    Do not submerge in a bath, pool or hot tub until the small incision is completely healed (5-7 days).   Activity   Do not lift anything that is heavier than 5 lb (2.3 kg)for the first 3 days.     Return to your normal activities after day 5.  Take it slowly and listen to your body. Ask your health care provider what activities are safe for you.   General instructions   Many women find a hot water bottle or heating pad helpful when placed on the lower abdomen.  This is fine to do if it helps your cramps.    Do not use any products that contain nicotine or tobacco.   These products include cigarettes, chewing tobacco, and vaping devices, such as e-cigarettes. These can delay incision healing. If you need help quitting, ask your health care provider.   Do not have sex or put anything in your vagina for at least 14 days.    Do not drink alcohol until your health care provider says it is okay.   Keep all follow-up visits. This is important.      Please contact our office at [phone] for the following symptoms:   You have a fever.   You have more redness, swelling, or pain around your incision.   You have more fluid or blood coming from your incision site.   Your incision feels warm to the touch.   You have pus or a bad smell coming from your incision or vagina.   You have a rash.   You have nausea, or you cannot eat or drink  anything without vomiting.   You have a vaginal discharge that is not getting lighter.   Get help right away if:   You have trouble breathing.   You have chest pain.   You have severe pain in your abdomen, and it does not get better with medicine.   You have leg pain or leg swelling.   You feel dizzy, or you faint.   Do not wait to see if the symptoms will go away.   Do not drive yourself to the hospital.      These symptoms may be an emergency.    Get help right away. Call 911.   Summary   After the procedure, it is common to have cramps, or pain or discomfort at the incision site. You will be given pain medicine.   Follow instructions from your health care provider about how to take care of your incision. Check your incision area every day for signs of infection.   Take over-the-counter and prescription medicines only as told by your health care provider.   Contact your health care provider if you have symptoms of infection or other symptoms that do not get better with treatment.   Thanks for visiting [facility] ! 

## 2023-02-01 DIAGNOSIS — Z5181 Encounter for therapeutic drug level monitoring: Secondary | ICD-10-CM | POA: Diagnosis not present

## 2023-02-01 DIAGNOSIS — M7918 Myalgia, other site: Secondary | ICD-10-CM | POA: Diagnosis not present

## 2023-02-01 DIAGNOSIS — G894 Chronic pain syndrome: Secondary | ICD-10-CM | POA: Diagnosis not present

## 2023-02-01 DIAGNOSIS — Z79899 Other long term (current) drug therapy: Secondary | ICD-10-CM | POA: Diagnosis not present

## 2023-02-04 LAB — GENECONNECT MOLECULAR SCREEN: Genetic Analysis Overall Interpretation: NEGATIVE

## 2023-02-06 NOTE — Progress Notes (Signed)
Spoke with Dr. Archer Asa regarding patient's concerns about her unchanged bleeding and passing clots. He stated this is normal and it may take it up to 3 cycles to see a difference. I called and left a message stating that as well as to give me a call back if she has any further questions or concerns

## 2023-02-06 NOTE — Progress Notes (Signed)
Phone call to pt to follow up from her [place] on 01/29/23. Pt. Reports that her pain overnight was manageable and the highest it has got between pain medication is a 5 which she has managed with a heating pad. Pt. Reports being able to keep liquids and some solid foods down despite a low appetite. Pt. Denies any signs of bleeding, infection, redness at the right femoral site, drainage at the site, fever, nausea, or vomiting.  However she stated her bleeding and passing clots has not changed it is same as it was prior to her procedure. I asked if she had called to speak with a nurse or Dr. Archer Asa and she stated no, I advised her that I would make him aware.  Pt verbalized understanding.

## 2023-02-07 DIAGNOSIS — F4312 Post-traumatic stress disorder, chronic: Secondary | ICD-10-CM | POA: Diagnosis not present

## 2023-02-07 DIAGNOSIS — F331 Major depressive disorder, recurrent, moderate: Secondary | ICD-10-CM | POA: Diagnosis not present

## 2023-02-07 DIAGNOSIS — F411 Generalized anxiety disorder: Secondary | ICD-10-CM | POA: Diagnosis not present

## 2023-02-07 DIAGNOSIS — F3131 Bipolar disorder, current episode depressed, mild: Secondary | ICD-10-CM | POA: Diagnosis not present

## 2023-02-08 ENCOUNTER — Encounter: Payer: Self-pay | Admitting: Obstetrics and Gynecology

## 2023-02-08 NOTE — Progress Notes (Signed)
Mr. Odom called concerned about his wife's bleeding, I reiterated what Dr. Archer Asa stated previously; that it could take up to 3 cycles before she may see any positive results from the procedure. I also stated that if he feels she is losing a lot of blood and wants her to be seen to contact her referring provider to make them aware also; as well as ensuring they had the after hour on call IR MD number [phone].

## 2023-02-09 DIAGNOSIS — N939 Abnormal uterine and vaginal bleeding, unspecified: Secondary | ICD-10-CM | POA: Diagnosis not present

## 2023-02-09 DIAGNOSIS — Z86718 Personal history of other venous thrombosis and embolism: Secondary | ICD-10-CM | POA: Diagnosis not present

## 2023-02-09 DIAGNOSIS — R102 Pelvic and perineal pain: Secondary | ICD-10-CM | POA: Diagnosis not present

## 2023-02-12 ENCOUNTER — Encounter (HOSPITAL_COMMUNITY): Payer: Self-pay

## 2023-02-12 ENCOUNTER — Encounter: Payer: Self-pay | Admitting: Medical Oncology

## 2023-02-12 ENCOUNTER — Inpatient Hospital Stay (HOSPITAL_BASED_OUTPATIENT_CLINIC_OR_DEPARTMENT_OTHER): Payer: Medicaid Other | Admitting: Medical Oncology

## 2023-02-12 ENCOUNTER — Inpatient Hospital Stay: Payer: Medicaid Other | Attending: Medical Oncology

## 2023-02-12 ENCOUNTER — Other Ambulatory Visit: Payer: Self-pay

## 2023-02-12 ENCOUNTER — Emergency Department (HOSPITAL_COMMUNITY)
Admission: EM | Admit: 2023-02-12 | Discharge: 2023-02-13 | Discharge status: Against Medical Advice | Payer: Medicaid Other | Attending: Emergency Medicine | Admitting: Emergency Medicine

## 2023-02-12 VITALS — BP 109/64 | HR 116 | Temp 98.6°F | Resp 18 | Ht 62.0 in | Wt 172.4 lb

## 2023-02-12 DIAGNOSIS — N920 Excessive and frequent menstruation with regular cycle: Secondary | ICD-10-CM | POA: Diagnosis not present

## 2023-02-12 DIAGNOSIS — Z5321 Procedure and treatment not carried out due to patient leaving prior to being seen by health care provider: Secondary | ICD-10-CM | POA: Insufficient documentation

## 2023-02-12 DIAGNOSIS — Z7901 Long term (current) use of anticoagulants: Secondary | ICD-10-CM | POA: Diagnosis not present

## 2023-02-12 DIAGNOSIS — D500 Iron deficiency anemia secondary to blood loss (chronic): Secondary | ICD-10-CM | POA: Insufficient documentation

## 2023-02-12 DIAGNOSIS — R110 Nausea: Secondary | ICD-10-CM | POA: Diagnosis not present

## 2023-02-12 DIAGNOSIS — Z86718 Personal history of other venous thrombosis and embolism: Secondary | ICD-10-CM

## 2023-02-12 DIAGNOSIS — D6859 Other primary thrombophilia: Secondary | ICD-10-CM

## 2023-02-12 DIAGNOSIS — I871 Compression of vein: Secondary | ICD-10-CM | POA: Diagnosis not present

## 2023-02-12 DIAGNOSIS — R42 Dizziness and giddiness: Secondary | ICD-10-CM | POA: Diagnosis not present

## 2023-02-12 DIAGNOSIS — Z793 Long term (current) use of hormonal contraceptives: Secondary | ICD-10-CM | POA: Diagnosis not present

## 2023-02-12 DIAGNOSIS — Z7902 Long term (current) use of antithrombotics/antiplatelets: Secondary | ICD-10-CM | POA: Diagnosis not present

## 2023-02-12 DIAGNOSIS — R0602 Shortness of breath: Secondary | ICD-10-CM | POA: Insufficient documentation

## 2023-02-12 DIAGNOSIS — N939 Abnormal uterine and vaginal bleeding, unspecified: Secondary | ICD-10-CM | POA: Diagnosis not present

## 2023-02-12 DIAGNOSIS — Z79890 Hormone replacement therapy (postmenopausal): Secondary | ICD-10-CM | POA: Diagnosis not present

## 2023-02-12 DIAGNOSIS — Z79899 Other long term (current) drug therapy: Secondary | ICD-10-CM | POA: Diagnosis not present

## 2023-02-12 DIAGNOSIS — R002 Palpitations: Secondary | ICD-10-CM | POA: Diagnosis not present

## 2023-02-12 DIAGNOSIS — I50813 Acute on chronic right heart failure: Secondary | ICD-10-CM | POA: Diagnosis not present

## 2023-02-12 LAB — CBC WITH DIFFERENTIAL (CANCER CENTER ONLY)
Abs Immature Granulocytes: 0.09 10*3/uL — ABNORMAL HIGH (ref 0.00–0.07)
Basophils Absolute: 0.1 10*3/uL (ref 0.0–0.1)
Basophils Relative: 1 %
Eosinophils Absolute: 0.2 10*3/uL (ref 0.0–0.5)
Eosinophils Relative: 2 %
HCT: 28 % — ABNORMAL LOW (ref 36.0–46.0)
Hemoglobin: 9.4 g/dL — ABNORMAL LOW (ref 12.0–15.0)
Immature Granulocytes: 1 %
Lymphocytes Relative: 24 %
Lymphs Abs: 2.8 10*3/uL (ref 0.7–4.0)
MCH: 31.1 pg (ref 26.0–34.0)
MCHC: 33.6 g/dL (ref 30.0–36.0)
MCV: 92.7 fL (ref 80.0–100.0)
Monocytes Absolute: 0.7 10*3/uL (ref 0.1–1.0)
Monocytes Relative: 6 %
Neutro Abs: 8.2 10*3/uL — ABNORMAL HIGH (ref 1.7–7.7)
Neutrophils Relative %: 66 %
Platelet Count: 421 10*3/uL — ABNORMAL HIGH (ref 150–400)
RBC: 3.02 MIL/uL — ABNORMAL LOW (ref 3.87–5.11)
RDW: 13.3 % (ref 11.5–15.5)
WBC Count: 12.1 10*3/uL — ABNORMAL HIGH (ref 4.0–10.5)
nRBC: 0 % (ref 0.0–0.2)

## 2023-02-12 LAB — CMP (CANCER CENTER ONLY)
ALT: 42 U/L (ref 0–44)
AST: 24 U/L (ref 15–41)
Albumin: 3.9 g/dL (ref 3.5–5.0)
Alkaline Phosphatase: 75 U/L (ref 38–126)
Anion gap: 5 (ref 5–15)
BUN: 11 mg/dL (ref 6–20)
CO2: 27 mmol/L (ref 22–32)
Calcium: 8.8 mg/dL — ABNORMAL LOW (ref 8.9–10.3)
Chloride: 108 mmol/L (ref 98–111)
Creatinine: 0.7 mg/dL (ref 0.44–1.00)
GFR, Estimated: >60 mL/min (ref >60)
Glucose, Bld: 99 mg/dL (ref 70–99)
Potassium: 4 mmol/L (ref 3.5–5.1)
Sodium: 140 mmol/L (ref 135–145)
Total Bilirubin: 0.3 mg/dL (ref 0.0–1.2)
Total Protein: 6.5 g/dL (ref 6.5–8.1)

## 2023-02-12 LAB — CBC WITH DIFFERENTIAL/PLATELET
Abs Immature Granulocytes: 0.03 10*3/uL (ref 0.00–0.07)
Basophils Absolute: 0.1 10*3/uL (ref 0.0–0.1)
Basophils Relative: 1 %
Eosinophils Absolute: 0.1 10*3/uL (ref 0.0–0.5)
Eosinophils Relative: 1 %
HCT: 27.8 % — ABNORMAL LOW (ref 36.0–46.0)
Hemoglobin: 9.3 g/dL — ABNORMAL LOW (ref 12.0–15.0)
Immature Granulocytes: 0 %
Lymphocytes Relative: 29 %
Lymphs Abs: 3 10*3/uL (ref 0.7–4.0)
MCH: 31.4 pg (ref 26.0–34.0)
MCHC: 33.5 g/dL (ref 30.0–36.0)
MCV: 93.9 fL (ref 80.0–100.0)
Monocytes Absolute: 0.7 10*3/uL (ref 0.1–1.0)
Monocytes Relative: 7 %
Neutro Abs: 6.6 10*3/uL (ref 1.7–7.7)
Neutrophils Relative %: 62 %
Platelets: 432 10*3/uL — ABNORMAL HIGH (ref 150–400)
RBC: 2.96 MIL/uL — ABNORMAL LOW (ref 3.87–5.11)
RDW: 13.5 % (ref 11.5–15.5)
WBC: 10.5 10*3/uL (ref 4.0–10.5)
nRBC: 0 % (ref 0.0–0.2)

## 2023-02-12 LAB — TYPE AND SCREEN
ABO/RH(D): O POS
Antibody Screen: NEGATIVE

## 2023-02-12 LAB — RETICULOCYTES
Immature Retic Fract: 30.3 % — ABNORMAL HIGH (ref 2.3–15.9)
RBC.: 3.09 MIL/uL — ABNORMAL LOW (ref 3.87–5.11)
Retic Count, Absolute: 127 10*3/uL (ref 19.0–186.0)
Retic Ct Pct: 4.1 % — ABNORMAL HIGH (ref 0.4–3.1)

## 2023-02-12 LAB — FERRITIN: Ferritin: 65 ng/mL (ref 11–307)

## 2023-02-12 NOTE — ED Provider Triage Note (Signed)
 Emergency Medicine Provider Triage Evaluation Note  Yesenia Harper , a 38 y.o. female  was evaluated in triage.  Patient reports heavy bleeding for the past 4 days, increasingly worsening.  She recently had uterine artery embolization for adenomyosis.  Denies any chest pain but does note some shortness of breath.  Review of Systems  Positive: As above Negative: As above  Physical Exam  BP (!) 122/90   Pulse (!) 114   Temp 98 F (36.7 C)   Resp 17   Ht 5' 2 (1.575 m)   Wt 78 kg   LMP 01/22/2023   SpO2 100%   BMI 31.46 kg/m  Gen:   Awake, no distress   Resp:  Normal effort  MSK:   Moves extremities without difficulty    Medical Decision Making  Medically screening exam initiated at 5:04 PM.  Appropriate orders placed.  Arly Salminen was informed that the remainder of the evaluation will be completed by another provider, this initial triage assessment does not replace that evaluation, and the importance of remaining in the ED until their evaluation is complete.     Veta Palma, PA-C 02/12/23 1705

## 2023-02-12 NOTE — Progress Notes (Signed)
 Hematology and Oncology Follow Up Visit  Yesenia Harper [id number] 1985/08/16 38 y.o. 02/12/2023   Principle Diagnosis:  May-Thurner Syndrome Recurrent DVT in the left lower extremity Iron deficiency anemia    Current Therapy:   Xarelto  20 mg PO daily IV iron as indicated- Feraheme 510 B12 IM weekly    Interim History: Yesenia Harper is here today for follow up.   She is not feeling particularity well. She is having fatigue, SOB, tachycardia. All symptoms that she had when her iron was low previously.  She had a Uterine ablation performed on the 01/29/2023. She did well without bleeding for about 1 week. Now bleeding heavily with large clots. She went to the ER on 02/09/2023 for this blood loss where a pelvic US  and exam was performed. Her Hgb at this time was 11.9. She was discharged home and asked to contact IR who performed the procedure.   She also has long history of hematuria with large clots. She has seen nephrology and urology and this is stable.  No petechiae.  No fever, chills, n/v, cough, rash, abdominal pain or changes in bowel or bladder habits.  She feels that she has a lot of fluid retention and notes weight gain over the last year.  Appetite comes and goes. She is doing her best to stay well hydrated.  No falls or syncope reported.   ECOG Performance Status: 1 - Symptomatic but completely ambulatory  Medications:  Allergies as of 02/12/2023       Reactions   Penicillins Itching, Rash   Patient feels itching in mouth.        Medication List        Accurate as of February 12, 2023  3:49 PM. If you have any questions, ask your nurse or doctor.          STOP taking these medications    Rexulti 1 MG Tabs tablet Generic drug: brexpiprazole Stopped by: Lauraine CHRISTELLA Dais       TAKE these medications    acetaminophen  500 MG tablet Commonly known as: TYLENOL  Take by mouth.   ALPRAZolam  0.25 MG tablet Commonly known as: XANAX  Take 0.25 mg by mouth 2  (two) times daily as needed for anxiety.   B-D 3CC LUER-LOK SYR 25GX1/2 25G X 1-1/2 3 ML Misc Generic drug: SYRINGE-NEEDLE (DISP) 3 ML Inject 1 each into the muscle once a week.   Botox 200 units injection Generic drug: botulinum toxin Type A Inject 200 Units into the muscle every 3 (three) months.   buPROPion  HCl ER (XL) 450 MG Tb24 Take 1 tablet by mouth daily. What changed: Another medication with the same name was removed. Continue taking this medication, and follow the directions you see here. Changed by: Lauraine CHRISTELLA Dais   cyanocobalamin  1000 MCG/ML injection Commonly known as: VITAMIN B12 Inject 1,000 mcg into the muscle every Wednesday.   etonogestrel  68 MG Impl implant Commonly known as: NEXPLANON  Inject 1 each into the skin once.   furosemide  20 MG tablet Commonly known as: LASIX  Take 20 mg by mouth in the morning.   hydroxychloroquine  200 MG tablet Commonly known as: PLAQUENIL  Take 400 mg by mouth daily.   levothyroxine  75 MCG tablet Commonly known as: SYNTHROID  Take one tablet by mouth daily on Mondays, Tuesdays, Wednesdays, Thursdays and Fridays.  Take one and one-half tablet on Saturdays and Sundays. NEEDS APPOINTMENT FOR FURTHER REFILLS.   linaclotide  290 MCG Caps capsule Commonly known as: LINZESS  Take 290 mcg by mouth daily.  liothyronine 5 MCG tablet Commonly known as: CYTOMEL Take 5 mcg by mouth daily.   lurasidone  40 MG Tabs tablet Commonly known as: LATUDA  Take 40 mg by mouth every evening.   meclizine  25 MG tablet Commonly known as: ANTIVERT  Take 25 mg by mouth 3 (three) times daily as needed.   MULTIVITAMIN ADULT PO Take 1 tablet by mouth in the morning.   norethindrone  5 MG tablet Commonly known as: AYGESTIN  Take 1 tablet (5 mg total) by mouth daily.   Nurtec 75 MG Tbdp Generic drug: Rimegepant Sulfate Take 75 mg by mouth daily as needed for migraine.   ondansetron  8 MG disintegrating tablet Commonly known as: ZOFRAN -ODT TAKE  1 TABLET BY MOUTH EVERY 8 HOURS AS NEEDED FOR NAUSEA   promethazine  12.5 MG tablet Commonly known as: PHENERGAN  Take 12.5 mg by mouth every 4 (four) hours as needed.   Qulipta  60 MG Tabs Generic drug: Atogepant  Take 60 mg by mouth daily.   rivaroxaban  20 MG Tabs tablet Commonly known as: XARELTO  Take 1 tablet (20 mg total) by mouth daily with supper.   spironolactone  25 MG tablet Commonly known as: ALDACTONE  Take 25 mg by mouth in the morning.   Tiadylt  ER 120 MG 24 hr capsule Generic drug: diltiazem  Take 120 mg by mouth daily.   Tiadylt  ER 120 MG 24 hr capsule Generic drug: diltiazem  Take 2 capsules by mouth daily.   traZODone 100 MG tablet Commonly known as: DESYREL Take 100 mg by mouth at bedtime.   Vilazodone  HCl 40 MG Tabs Commonly known as: Viibryd  Take 1 tablet (40 mg total) by mouth daily.   Vitamin D (Ergocalciferol) 1.25 MG (50000 UNIT) Caps capsule Commonly known as: DRISDOL Take 50,000 Units by mouth every Thursday.   zolpidem  10 MG tablet Commonly known as: AMBIEN  Take 10 mg by mouth at bedtime as needed.        Allergies:  Allergies  Allergen Reactions   Penicillins Itching and Rash    Patient feels itching in mouth.    Past Medical History, Surgical history, Social history, and Family History were reviewed and updated.  Review of Systems: All other 10 point review of systems is negative.   Physical Exam:  height is 5' 2 (1.575 m) and weight is 172 lb 6.4 oz (78.2 kg). Her oral temperature is 98.6 F (37 C). Her blood pressure is 109/64 and her pulse is 116 (abnormal). Her respiration is 18 and oxygen saturation is 100%.   Wt Readings from Last 3 Encounters:  02/12/23 172 lb 6.4 oz (78.2 kg)  01/23/23 171 lb 8 oz (77.8 kg)  11/12/22 177 lb (80.3 kg)   Constitutional: Well appearing.  Ocular: Sclerae unicteric, pupils equal, round and reactive to light Ear-nose-throat: Oropharynx clear, dentition fair Lymphatic: No cervical or  supraclavicular adenopathy Lungs no rales or rhonchi, good excursion bilaterally Heart regular rate and rhythm, no murmur appreciated Abd soft, nontender, positive bowel sounds MSK no focal spinal tenderness, no joint edema Neuro: non-focal, well-oriented, appropriate affect  Lab Results  Component Value Date   WBC 12.1 (H) 02/12/2023   HGB 9.4 (L) 02/12/2023   HCT 28.0 (L) 02/12/2023   MCV 92.7 02/12/2023   PLT 421 (H) 02/12/2023   Lab Results  Component Value Date   FERRITIN 14 11/12/2022   IRON 112 11/12/2022   TIBC 371 11/12/2022   UIBC 259 11/12/2022   IRONPCTSAT 30 11/12/2022   Lab Results  Component Value Date   RETICCTPCT 4.1 (H) 02/12/2023  RBC 3.09 (L) 02/12/2023   No results found for: KPAFRELGTCHN, LAMBDASER, KAPLAMBRATIO No results found for: IGGSERUM, IGA, IGMSERUM No results found for: STEPHANY CARLOTA BENSON MARKEL EARLA JOANNIE DOC VICK, SPEI   Chemistry      Component Value Date/Time   NA 140 02/12/2023 1504   NA 138 04/16/2022 0000   K 4.0 02/12/2023 1504   CL 108 02/12/2023 1504   CO2 27 02/12/2023 1504   BUN 11 02/12/2023 1504   BUN 13 04/16/2022 0000   CREATININE 0.70 02/12/2023 1504   CREATININE 0.78 05/12/2021 0000   GLU 102 04/16/2022 0000      Component Value Date/Time   CALCIUM 8.8 (L) 02/12/2023 1504   ALKPHOS 75 02/12/2023 1504   AST 24 02/12/2023 1504   ALT 42 02/12/2023 1504   BILITOT 0.3 02/12/2023 1504     Encounter Diagnoses  Name Primary?   Iron deficiency anemia due to chronic blood loss Yes   Primary hypercoagulable state (HCC)    May-Thurner syndrome    History of DVT (deep vein thrombosis)    Impression and Plan: Ms. Derryberry is a very pleasant 38 yo caucasian female with history recurrent left lower extremity DVT and [name]. Hyper coag panel was negative. She now appears to have IDA secondary to heavy menses/chronic hematuria. She has been treated with IV iron.     Vitals stable with exception of some mild tachycardia.  Lower suspicion for PE causing tachycardia given her O2 of 100% and symptoms being similar to past anemia episodes.  CBC shows a fall in her Hgb from 12.7 to 9.4 secondary to her heavy bleeding which has continued. I am concerned with the amount of loss she has had over a 3 day time period along with her symptoms. I would like her worked up further. I have asked that she either have her husband take her to the emergency room as she is clinically stable OR call GYN on call to ask for further advisement.  She will contact us  tomorrow to update us  I would like to see her on Thursday for follow up and lab recheck.  Reviewed red flags.  Iron studied pending. Likely will need replacement. We will get this set up once confirmed    RTC Thursday APP , labs (CBC, sample to blood bank) RTC Friday Possible 1 unit of blood   Lauraine HERO [place], [place] 2/4/20253:49 PM

## 2023-02-12 NOTE — ED Triage Notes (Signed)
Pt had urinary artery embolization Jan 21. Pt states she started bleeding 4 days ago and has to change pad every hour. Pt states HBG has dropped. Denies CP. C/O SHOB/dizziness/ nausea. Denies painful urination.

## 2023-02-13 ENCOUNTER — Other Ambulatory Visit: Payer: Self-pay | Admitting: Medical Oncology

## 2023-02-13 ENCOUNTER — Encounter: Payer: Self-pay | Admitting: Family

## 2023-02-13 ENCOUNTER — Ambulatory Visit (INDEPENDENT_AMBULATORY_CARE_PROVIDER_SITE_OTHER): Payer: Medicaid Other | Admitting: Obstetrics and Gynecology

## 2023-02-13 ENCOUNTER — Encounter: Payer: Self-pay | Admitting: Medical Oncology

## 2023-02-13 ENCOUNTER — Encounter: Payer: Self-pay | Admitting: Obstetrics and Gynecology

## 2023-02-13 ENCOUNTER — Encounter: Payer: Self-pay | Admitting: Interventional Radiology

## 2023-02-13 VITALS — BP 113/79 | HR 94 | Ht 62.0 in | Wt 171.0 lb

## 2023-02-13 DIAGNOSIS — Z975 Presence of (intrauterine) contraceptive device: Secondary | ICD-10-CM | POA: Diagnosis not present

## 2023-02-13 DIAGNOSIS — N921 Excessive and frequent menstruation with irregular cycle: Secondary | ICD-10-CM

## 2023-02-13 DIAGNOSIS — N939 Abnormal uterine and vaginal bleeding, unspecified: Secondary | ICD-10-CM

## 2023-02-13 DIAGNOSIS — D500 Iron deficiency anemia secondary to blood loss (chronic): Secondary | ICD-10-CM

## 2023-02-13 DIAGNOSIS — N8003 Adenomyosis of the uterus: Secondary | ICD-10-CM

## 2023-02-13 LAB — IRON AND IRON BINDING CAPACITY (CC-WL,HP ONLY)
Iron: 36 ug/dL (ref 28–170)
Saturation Ratios: 13 % (ref 10.4–31.8)
TIBC: 280 ug/dL (ref 250–450)
UIBC: 244 ug/dL (ref 148–442)

## 2023-02-13 MED ORDER — NORETHINDRONE ACETATE 5 MG PO TABS: 15.0000 mg | ORAL_TABLET | Freq: Every day | ORAL | 2 refills | Status: DC

## 2023-02-13 NOTE — ED Notes (Signed)
Pt called for vitals with no answer.  

## 2023-02-13 NOTE — Patient Instructions (Signed)
 Increase the aygestin  to 15mg  daily x 2 weeks We will follow up

## 2023-02-13 NOTE — Progress Notes (Signed)
Pulse ox 97

## 2023-02-13 NOTE — Progress Notes (Signed)
   RETURN GYNECOLOGY VISIT  Subjective:  Yesenia Harper is a 38 y.o. [phone] with AUB due to adenomyosis and anticoagulation now 2 weeks s/p UAE presenting for heavy vaginal bleeding.  Follows with Dr. Cleatus for painful, heavy periods likely related to adenomyosis and xarelto  (hx VTE, May-Thurner Syndrome). She has a nexplanon  in place and is s/p UAE on 01/29/23. She continues on aygestin  10mg  daily  Since her UAE, she reports ongoing HVB. Was soaking pads in less than 1 hour, but has started to improve recently. Is now changing pads q2-3 hours. No significant pain, no fevers. Reports significant fatigue and dizziness.  Saw heme yesterday who obtained CBC and iron studies. CBC shows anemia with Hgb 9.4 from baseline in 11 range. Ferritin, TIBC normal. She is planned for heme appointment tomorrow and reports they are going to recheck CBC then.   I personally reviewed the following: - CBC 02/09/23 Hgb 11.9 - INR 02/09/23 1.6 - HCG 02/09/23 negative - CBC 02/12/23 Hgb 9.4 - Ferritin 02/12/23 65, TIBC 280  Objective:   Vitals:   02/13/23 1411  BP: 113/79  Pulse: 94  Weight: 171 lb (77.6 kg)  Height: 5' 2 (1.575 m)   General:  Alert, oriented and cooperative. Patient is in no acute distress.  Skin: Skin is warm and dry. No rash noted.   Cardiovascular: Normal heart rate noted  Respiratory: Normal respiratory effort, no problems with respiration noted  Abdomen: Soft, non-tender, non-distended   Pelvic: NEFG. No active bleeding from cervix ~5cc of blood and clot in vaginal vault. Cervix visually normal.   Exam performed in the presence of a chaperone  Assessment and Plan:  Yesenia Harper is a 38 y.o. with AUB and persistent heavy bleeding s/p UAE  Increase to aygestin  15mg  daily x 2 weeks to help temporize bleeding, then taper back to 10mg  daily Discussed goal of ultimately tapering off aygestin  but need to get through this transition phase Follow up CBC with heme tomorrow If still no  improvement in overall bleeding pattern after 3 months, would consider ablation or hysterectomy   Return if symptoms worsen or fail to improve.  Future Appointments  Date Time Provider Department Center  02/14/2023  2:00 PM [facility] None  02/14/2023  2:15 PM [place], Lauraine HERO, PA-C [facility] None   Kieth JAYSON Carolin, MD

## 2023-02-14 ENCOUNTER — Inpatient Hospital Stay: Payer: Medicaid Other | Admitting: Medical Oncology

## 2023-02-14 ENCOUNTER — Encounter: Payer: Self-pay | Admitting: Medical Oncology

## 2023-02-14 ENCOUNTER — Inpatient Hospital Stay: Payer: Medicaid Other

## 2023-02-14 VITALS — BP 105/79 | HR 101 | Temp 98.3°F | Resp 19 | Ht 62.0 in | Wt 174.0 lb

## 2023-02-14 DIAGNOSIS — D500 Iron deficiency anemia secondary to blood loss (chronic): Secondary | ICD-10-CM

## 2023-02-14 DIAGNOSIS — N939 Abnormal uterine and vaginal bleeding, unspecified: Secondary | ICD-10-CM

## 2023-02-14 DIAGNOSIS — D6859 Other primary thrombophilia: Secondary | ICD-10-CM | POA: Diagnosis not present

## 2023-02-14 DIAGNOSIS — G43719 Chronic migraine without aura, intractable, without status migrainosus: Secondary | ICD-10-CM | POA: Diagnosis not present

## 2023-02-14 LAB — CMP (CANCER CENTER ONLY)
ALT: 30 U/L (ref 0–44)
AST: 13 U/L — ABNORMAL LOW (ref 15–41)
Albumin: 4.2 g/dL (ref 3.5–5.0)
Alkaline Phosphatase: 81 U/L (ref 38–126)
Anion gap: 8 (ref 5–15)
BUN: 10 mg/dL (ref 6–20)
CO2: 28 mmol/L (ref 22–32)
Calcium: 9.4 mg/dL (ref 8.9–10.3)
Chloride: 106 mmol/L (ref 98–111)
Creatinine: 0.7 mg/dL (ref 0.44–1.00)
GFR, Estimated: >60 mL/min (ref >60)
Glucose, Bld: 79 mg/dL (ref 70–99)
Potassium: 3.7 mmol/L (ref 3.5–5.1)
Sodium: 142 mmol/L (ref 135–145)
Total Bilirubin: 0.2 mg/dL (ref 0.0–1.2)
Total Protein: 6.4 g/dL — ABNORMAL LOW (ref 6.5–8.1)

## 2023-02-14 LAB — SAMPLE TO BLOOD BANK

## 2023-02-14 LAB — CBC WITH DIFFERENTIAL (CANCER CENTER ONLY)
Abs Immature Granulocytes: 0.03 10*3/uL (ref 0.00–0.07)
Basophils Absolute: 0.1 10*3/uL (ref 0.0–0.1)
Basophils Relative: 1 %
Eosinophils Absolute: 0.2 10*3/uL (ref 0.0–0.5)
Eosinophils Relative: 2 %
HCT: 28.8 % — ABNORMAL LOW (ref 36.0–46.0)
Hemoglobin: 9.4 g/dL — ABNORMAL LOW (ref 12.0–15.0)
Immature Granulocytes: 0 %
Lymphocytes Relative: 26 %
Lymphs Abs: 2.6 10*3/uL (ref 0.7–4.0)
MCH: 31 pg (ref 26.0–34.0)
MCHC: 32.6 g/dL (ref 30.0–36.0)
MCV: 95 fL (ref 80.0–100.0)
Monocytes Absolute: 0.7 10*3/uL (ref 0.1–1.0)
Monocytes Relative: 7 %
Neutro Abs: 6.5 10*3/uL (ref 1.7–7.7)
Neutrophils Relative %: 64 %
Platelet Count: 416 10*3/uL — ABNORMAL HIGH (ref 150–400)
RBC: 3.03 MIL/uL — ABNORMAL LOW (ref 3.87–5.11)
RDW: 13.9 % (ref 11.5–15.5)
WBC Count: 10.1 10*3/uL (ref 4.0–10.5)
nRBC: 0 % (ref 0.0–0.2)

## 2023-02-14 LAB — FERRITIN: Ferritin: 46 ng/mL (ref 11–307)

## 2023-02-14 LAB — FOLATE: Folate: 3.4 ng/mL — ABNORMAL LOW (ref >5.9)

## 2023-02-14 LAB — VITAMIN B12: Vitamin B-12: 802 pg/mL (ref 180–914)

## 2023-02-14 MED ORDER — RIVAROXABAN 10 MG PO TABS
10.0000 mg | ORAL_TABLET | Freq: Every day | ORAL | 0 refills | Status: DC
Start: 2023-02-14 — End: 2023-03-05

## 2023-02-14 NOTE — Progress Notes (Signed)
 Hematology and Oncology Follow Up Visit  Yesenia Harper [id number] July 25, 1985 37 y.o. 02/14/2023   Principle Diagnosis:  May-Thurner Syndrome Recurrent DVT in the left lower extremity Iron deficiency anemia    Current Therapy:   Xarelto  20 mg PO daily IV iron as indicated- Feraheme 510 B12 IM weekly    Interim History: [name] is here today for follow up.   She is here for follow up of abnormal uterine bleeding following uterine ablation procedure.  She was last seen by our office on 02/12/2023 at which time she was passing many blood clots vaginally. Her Hgb had dropped from 11 to 9.4 over the course of 3 days. I advised her to contact the on call GYN or go to the ER as she was symptomatic. Given her history of hypercoagulable state and recurrent DVT we were hesitant to stop her xarelto  in hopes that GYN could advise of medication to slow her bleeding while maintaining her anticoagulant protection.   Since her last visit she reports that her GYN increased her Agestin from 2 tablets daily to 3 tablets daily. Bleeding has reduced with this but has not stopped completely. Symptoms are stable.   No petechiae.  No fever, chills, n/v, cough, rash, abdominal pain or changes in bowel or bladder habits.  She feels that she has a lot of fluid retention and notes weight gain over the last year.  Appetite comes and goes. She is doing her best to stay well hydrated.  No falls or syncope reported.   ECOG Performance Status: 1 - Symptomatic but completely ambulatory  Medications:  Allergies as of 02/14/2023       Reactions   Penicillins Itching, Rash   Patient feels itching in mouth.        Medication List        Accurate as of February 14, 2023  2:38 PM. If you have any questions, ask your nurse or doctor.          acetaminophen  500 MG tablet Commonly known as: TYLENOL  Take by mouth.   ALPRAZolam  0.25 MG tablet Commonly known as: XANAX  Take 0.25 mg by mouth 2 (two) times  daily as needed for anxiety.   B-D 3CC LUER-LOK SYR 25GX1/2 25G X 1-1/2 3 ML Misc Generic drug: SYRINGE-NEEDLE (DISP) 3 ML Inject 1 each into the muscle once a week.   Botox 200 units injection Generic drug: botulinum toxin Type A Inject 200 Units into the muscle every 3 (three) months.   buPROPion  HCl ER (XL) 450 MG Tb24 Take 1 tablet by mouth daily.   cyanocobalamin  1000 MCG/ML injection Commonly known as: VITAMIN B12 Inject 1,000 mcg into the muscle every Wednesday.   etonogestrel  68 MG Impl implant Commonly known as: NEXPLANON  Inject 1 each into the skin once.   furosemide  20 MG tablet Commonly known as: LASIX  Take 20 mg by mouth in the morning.   hydroxychloroquine  200 MG tablet Commonly known as: PLAQUENIL  Take 400 mg by mouth daily.   levothyroxine  75 MCG tablet Commonly known as: SYNTHROID  Take one tablet by mouth daily on Mondays, Tuesdays, Wednesdays, Thursdays and Fridays.  Take one and one-half tablet on Saturdays and Sundays. NEEDS APPOINTMENT FOR FURTHER REFILLS.   linaclotide  290 MCG Caps capsule Commonly known as: LINZESS  Take 290 mcg by mouth daily.   liothyronine 5 MCG tablet Commonly known as: CYTOMEL Take 5 mcg by mouth daily.   lurasidone  40 MG Tabs tablet Commonly known as: LATUDA  Take 40 mg by mouth every  evening.   meclizine  25 MG tablet Commonly known as: ANTIVERT  Take 25 mg by mouth 3 (three) times daily as needed.   MULTIVITAMIN ADULT PO Take 1 tablet by mouth in the morning.   norethindrone  5 MG tablet Commonly known as: AYGESTIN  Take 3 tablets (15 mg total) by mouth daily.   Nurtec 75 MG Tbdp Generic drug: Rimegepant Sulfate Take 75 mg by mouth daily as needed for migraine.   ondansetron  8 MG disintegrating tablet Commonly known as: ZOFRAN -ODT TAKE 1 TABLET BY MOUTH EVERY 8 HOURS AS NEEDED FOR NAUSEA   promethazine  12.5 MG tablet Commonly known as: PHENERGAN  Take 12.5 mg by mouth every 4 (four) hours as needed.    Qulipta  60 MG Tabs Generic drug: Atogepant  Take 60 mg by mouth daily.   rivaroxaban  20 MG Tabs tablet Commonly known as: XARELTO  Take 1 tablet (20 mg total) by mouth daily with supper.   spironolactone  25 MG tablet Commonly known as: ALDACTONE  Take 25 mg by mouth in the morning.   Tiadylt  ER 120 MG 24 hr capsule Generic drug: diltiazem  Take 120 mg by mouth daily.   Tiadylt  ER 120 MG 24 hr capsule Generic drug: diltiazem  Take 2 capsules by mouth daily.   traZODone 100 MG tablet Commonly known as: DESYREL Take 100 mg by mouth at bedtime.   Vilazodone  HCl 40 MG Tabs Commonly known as: Viibryd  Take 1 tablet (40 mg total) by mouth daily.   Vitamin D (Ergocalciferol) 1.25 MG (50000 UNIT) Caps capsule Commonly known as: DRISDOL Take 50,000 Units by mouth every Thursday.   zolpidem  10 MG tablet Commonly known as: AMBIEN  Take 10 mg by mouth at bedtime as needed.        Allergies:  Allergies  Allergen Reactions   Penicillins Itching and Rash    Patient feels itching in mouth.    Past Medical History, Surgical history, Social history, and Family History were reviewed and updated.  Review of Systems: All other 10 point review of systems is negative.   Physical Exam:  height is 5' 2 (1.575 m) and weight is 174 lb (78.9 kg). Her oral temperature is 98.3 F (36.8 C). Her blood pressure is 105/79 and her pulse is 101 (abnormal). Her respiration is 19 and oxygen saturation is 100%.   Wt Readings from Last 3 Encounters:  02/14/23 174 lb (78.9 kg)  02/13/23 171 lb (77.6 kg)  02/12/23 172 lb (78 kg)   Constitutional: Well appearing.  Ocular: Sclerae unicteric, pupils equal, round and reactive to light Ear-nose-throat: Oropharynx clear, dentition fair Lymphatic: No cervical or supraclavicular adenopathy Lungs no rales or rhonchi, good excursion bilaterally Heart regular rate and rhythm, no murmur appreciated Abd soft, nontender, positive bowel sounds MSK no focal  spinal tenderness, no joint edema Neuro: non-focal, well-oriented, appropriate affect  Lab Results  Component Value Date   WBC 10.1 02/14/2023   HGB 9.4 (L) 02/14/2023   HCT 28.8 (L) 02/14/2023   MCV 95.0 02/14/2023   PLT 416 (H) 02/14/2023   Lab Results  Component Value Date   FERRITIN 65 02/12/2023   IRON 36 02/12/2023   TIBC 280 02/12/2023   UIBC 244 02/12/2023   IRONPCTSAT 13 02/12/2023   Lab Results  Component Value Date   RETICCTPCT 4.1 (H) 02/12/2023   RBC 3.03 (L) 02/14/2023   No results found for: KPAFRELGTCHN, LAMBDASER, KAPLAMBRATIO No results found for: IGGSERUM, IGA, IGMSERUM No results found for: TOTALPROTELP, ALBUMINELP, A1GS, A2GS, BETS, BETA2SER, GAMS, MSPIKE, SPEI   Chemistry  Component Value Date/Time   NA 140 02/12/2023 1504   NA 138 04/16/2022 0000   K 4.0 02/12/2023 1504   CL 108 02/12/2023 1504   CO2 27 02/12/2023 1504   BUN 11 02/12/2023 1504   BUN 13 04/16/2022 0000   CREATININE 0.70 02/12/2023 1504   CREATININE 0.78 05/12/2021 0000   GLU 102 04/16/2022 0000      Component Value Date/Time   CALCIUM 8.8 (L) 02/12/2023 1504   ALKPHOS 75 02/12/2023 1504   AST 24 02/12/2023 1504   ALT 42 02/12/2023 1504   BILITOT 0.3 02/12/2023 1504     Encounter Diagnoses  Name Primary?   Iron deficiency anemia due to chronic blood loss Yes   Abnormal uterine bleeding    Primary hypercoagulable state (HCC)     Impression and Plan: Ms. Person is a very pleasant 38 yo caucasian female with history recurrent left lower extremity DVT and [name]. Hyper coag panel was negative. She now appears to have IDA secondary to heavy menses/chronic hematuria. She has been treated with IV iron.    Very happy to see her Hgb stabilizing and that bleeding has slowed. At this time we discussed risks/benefits for lowering her Xarelto  dose to 10 mg daily until uterine bleeding has stopped for 48 hours. She then will return to her  20 mg dose and alert me if bleeding restarts. We will continue to monitor her and administer IV iron/blood transfusions as needed.    RTC 1 week APP, labs (CBC)  Lauraine CHRISTELLA Dais, PA-C 2/6/20252:38 PM

## 2023-02-15 LAB — IRON AND IRON BINDING CAPACITY (CC-WL,HP ONLY)
Iron: 26 ug/dL — ABNORMAL LOW (ref 28–170)
Saturation Ratios: 9 % — ABNORMAL LOW (ref 10.4–31.8)
TIBC: 281 ug/dL (ref 250–450)
UIBC: 255 ug/dL (ref 148–442)

## 2023-02-19 ENCOUNTER — Encounter: Payer: Self-pay | Admitting: Medical Oncology

## 2023-02-19 ENCOUNTER — Other Ambulatory Visit: Payer: Self-pay | Admitting: Medical Oncology

## 2023-02-19 ENCOUNTER — Telehealth: Payer: Self-pay | Admitting: Hematology & Oncology

## 2023-02-19 DIAGNOSIS — D500 Iron deficiency anemia secondary to blood loss (chronic): Secondary | ICD-10-CM | POA: Diagnosis not present

## 2023-02-19 MED ORDER — FOLIC ACID 1 MG PO TABS: 1.0000 mg | ORAL_TABLET | Freq: Every day | ORAL | 6 refills | Status: DC

## 2023-02-19 NOTE — Telephone Encounter (Signed)
Called pt and left a vm to schedule 2 does of iv iron weekly per in basket.

## 2023-02-20 ENCOUNTER — Encounter: Payer: Self-pay | Admitting: Medical Oncology

## 2023-02-20 ENCOUNTER — Inpatient Hospital Stay (HOSPITAL_BASED_OUTPATIENT_CLINIC_OR_DEPARTMENT_OTHER): Payer: Medicaid Other | Admitting: Medical Oncology

## 2023-02-20 ENCOUNTER — Inpatient Hospital Stay: Payer: Medicaid Other

## 2023-02-20 VITALS — BP 113/79 | HR 105 | Temp 98.8°F | Resp 18 | Ht 62.0 in | Wt 173.0 lb

## 2023-02-20 DIAGNOSIS — N939 Abnormal uterine and vaginal bleeding, unspecified: Secondary | ICD-10-CM

## 2023-02-20 DIAGNOSIS — D6859 Other primary thrombophilia: Secondary | ICD-10-CM

## 2023-02-20 DIAGNOSIS — E538 Deficiency of other specified B group vitamins: Secondary | ICD-10-CM | POA: Diagnosis not present

## 2023-02-20 DIAGNOSIS — D500 Iron deficiency anemia secondary to blood loss (chronic): Secondary | ICD-10-CM

## 2023-02-20 LAB — CBC
HCT: 31.5 % — ABNORMAL LOW (ref 36.0–46.0)
Hemoglobin: 10.2 g/dL — ABNORMAL LOW (ref 12.0–15.0)
MCH: 30.4 pg (ref 26.0–34.0)
MCHC: 32.4 g/dL (ref 30.0–36.0)
MCV: 93.8 fL (ref 80.0–100.0)
Platelets: 447 10*3/uL — ABNORMAL HIGH (ref 150–400)
RBC: 3.36 MIL/uL — ABNORMAL LOW (ref 3.87–5.11)
RDW: 13.4 % (ref 11.5–15.5)
WBC: 10.3 10*3/uL (ref 4.0–10.5)
nRBC: 0 % (ref 0.0–0.2)

## 2023-02-20 NOTE — Progress Notes (Signed)
Hematology and Oncology Follow Up Visit  Yesenia Harper [id number] September 27, 1985 37 y.o. 02/20/2023   Principle Diagnosis:  May-Thurner Syndrome Recurrent DVT in the left lower extremity Iron deficiency anemia    Current Therapy:   Xarelto 20 mg PO daily IV iron as indicated- Feraheme 510 B12 IM weekly    Interim History: Yesenia Harper is here today for follow up.   She is here for follow up of abnormal uterine bleeding following uterine ablation procedure.  She was seen by our office on 02/12/2023 at which time she was passing many blood clots vaginally. Her Hgb had dropped from 11 to 9.4 over the course of 3 days. I advised her to contact the on call GYN or go to the ER as she was symptomatic. Given her history of hypercoagulable state and recurrent DVT we were hesitant to stop her xarelto in hopes that GYN could advise of medication to slow her bleeding while maintaining her anticoagulant protection.   At her 02/14/2023 visit, she reports that her GYN increased her Agestin from 2 tablets daily to 3 tablets daily. Bleeding had reduced with this but has not stopped completely. It was decided to have her reduce her Xarelto from 20 mg to 10 mg at that time until bleeding had stopped for 24 hours. She was also started on folate given her very low folate level.   Today she states that she is feeling better than her last visit. Energy, SOB have reduced. Still having tachycardia when she stands. She turned in her holter monitor yesterday to her cardiologist.   Uterine bleeding has slowed significantly but is still present. She has tolerated the reduced dose of Xarelto well.   No petechiae.  No fever, chills, n/v, cough, rash, abdominal pain or changes in bowel or bladder habits.  Appetite comes and goes. She is doing her best to Harper well hydrated.  No falls or syncope reported.   ECOG Performance Status: 1 - Symptomatic but completely ambulatory  Medications:  Allergies as of 02/20/2023        Reactions   Penicillins Itching, Rash   Patient feels itching in mouth.        Medication List        Accurate as of February 20, 2023  2:48 PM. If you have any questions, ask your nurse or doctor.          acetaminophen 500 MG tablet Commonly known as: TYLENOL Take by mouth.   ALPRAZolam 0.25 MG tablet Commonly known as: XANAX Take 0.25 mg by mouth 2 (two) times daily as needed for anxiety.   B-D 3CC LUER-LOK SYR 25GX1/2" 25G X 1-1/2" 3 ML Misc Generic drug: SYRINGE-NEEDLE (DISP) 3 ML Inject 1 each into the muscle once a week.   Botox 200 units injection Generic drug: botulinum toxin Type A Inject 200 Units into the muscle every 3 (three) months.   buPROPion HCl ER (XL) 450 MG Tb24 Take 1 tablet by mouth daily.   cyanocobalamin 1000 MCG/ML injection Commonly known as: VITAMIN B12 Inject 1,000 mcg into the muscle every Wednesday.   etonogestrel 68 MG Impl implant Commonly known as: NEXPLANON Inject 1 each into the skin once.   folic acid 1 MG tablet Commonly known as: FOLVITE Take 1 tablet (1 mg total) by mouth daily.   furosemide 20 MG tablet Commonly known as: LASIX Take 20 mg by mouth in the morning.   hydroxychloroquine 200 MG tablet Commonly known as: PLAQUENIL Take 400 mg by mouth daily.  levothyroxine 75 MCG tablet Commonly known as: SYNTHROID Take one tablet by mouth daily on Mondays, Tuesdays, Wednesdays, Thursdays and Fridays.  Take one and one-half tablet on Saturdays and Sundays. NEEDS APPOINTMENT FOR FURTHER REFILLS.   linaclotide 290 MCG Caps capsule Commonly known as: LINZESS Take 290 mcg by mouth daily.   liothyronine 5 MCG tablet Commonly known as: CYTOMEL Take 5 mcg by mouth daily.   lurasidone 40 MG Tabs tablet Commonly known as: LATUDA Take 40 mg by mouth every evening.   meclizine 25 MG tablet Commonly known as: ANTIVERT Take 25 mg by mouth 3 (three) times daily as needed.   MULTIVITAMIN ADULT PO Take 1 tablet  by mouth in the morning.   norethindrone 5 MG tablet Commonly known as: AYGESTIN Take 3 tablets (15 mg total) by mouth daily.   Nurtec 75 MG Tbdp Generic drug: Rimegepant Sulfate Take 75 mg by mouth daily as needed for migraine.   ondansetron 8 MG disintegrating tablet Commonly known as: ZOFRAN-ODT TAKE 1 TABLET BY MOUTH EVERY 8 HOURS AS NEEDED FOR NAUSEA   promethazine 12.5 MG tablet Commonly known as: PHENERGAN Take 12.5 mg by mouth every 4 (four) hours as needed.   Qulipta 60 MG Tabs Generic drug: Atogepant Take 60 mg by mouth daily.   rivaroxaban 20 MG Tabs tablet Commonly known as: XARELTO Take 1 tablet (20 mg total) by mouth daily with supper.   rivaroxaban 10 MG Tabs tablet Commonly known as: XARELTO Take 1 tablet (10 mg total) by mouth daily. When bleeding has resolved for 48 hours return to 20 mg daily dose   spironolactone 25 MG tablet Commonly known as: ALDACTONE Take 25 mg by mouth in the morning.   Tiadylt ER 120 MG 24 hr capsule Generic drug: diltiazem Take 120 mg by mouth daily.   Tiadylt ER 120 MG 24 hr capsule Generic drug: diltiazem Take 2 capsules by mouth daily.   traZODone 100 MG tablet Commonly known as: DESYREL Take 100 mg by mouth at bedtime.   Vilazodone HCl 40 MG Tabs Commonly known as: Viibryd Take 1 tablet (40 mg total) by mouth daily.   Vitamin D (Ergocalciferol) 1.25 MG (50000 UNIT) Caps capsule Commonly known as: DRISDOL Take 50,000 Units by mouth every Thursday.   zolpidem 10 MG tablet Commonly known as: AMBIEN Take 10 mg by mouth at bedtime as needed.        Allergies:  Allergies  Allergen Reactions   Penicillins Itching and Rash    Patient feels itching in mouth.    Past Medical History, Surgical history, Social history, and Family History were reviewed and updated.  Review of Systems: All other 10 point review of systems is negative.   Physical Exam:  height is 5\' 2"  (1.575 m) and weight is 173 lb (78.5 kg).  Her oral temperature is 98.8 F (37.1 C). Her blood pressure is 113/79 and her pulse is 105 (abnormal). Her respiration is 18 and oxygen saturation is 99%.   Wt Readings from Last 3 Encounters:  02/20/23 173 lb (78.5 kg)  02/14/23 174 lb (78.9 kg)  02/13/23 171 lb (77.6 kg)   Constitutional: Well appearing.  Ocular: Sclerae unicteric, pupils equal, round and reactive to light Ear-nose-throat: Oropharynx clear, dentition fair Lymphatic: No cervical or supraclavicular adenopathy Lungs no rales or rhonchi, good excursion bilaterally Heart regular rate and rhythm, no murmur appreciated Abd soft, nontender, positive bowel sounds MSK no focal spinal tenderness, no joint edema Neuro: non-focal, well-oriented, appropriate affect  Lab Results  Component Value Date   WBC 10.3 02/20/2023   HGB 10.2 (L) 02/20/2023   HCT 31.5 (L) 02/20/2023   MCV 93.8 02/20/2023   PLT 447 (H) 02/20/2023   Lab Results  Component Value Date   FERRITIN 46 02/14/2023   IRON 26 (L) 02/14/2023   TIBC 281 02/14/2023   UIBC 255 02/14/2023   IRONPCTSAT 9 (L) 02/14/2023   Lab Results  Component Value Date   RETICCTPCT 4.1 (H) 02/12/2023   RBC 3.36 (L) 02/20/2023   No results found for: "KPAFRELGTCHN", "LAMBDASER", "KAPLAMBRATIO" No results found for: "IGGSERUM", "IGA", "IGMSERUM" No results found for: "TOTALPROTELP", "ALBUMINELP", "A1GS", "A2GS", "BETS", "BETA2SER", "GAMS", "MSPIKE", "SPEI"   Chemistry      Component Value Date/Time   NA 142 02/14/2023 1412   NA 138 04/16/2022 0000   K 3.7 02/14/2023 1412   CL 106 02/14/2023 1412   CO2 28 02/14/2023 1412   BUN 10 02/14/2023 1412   BUN 13 04/16/2022 0000   CREATININE 0.70 02/14/2023 1412   CREATININE 0.78 05/12/2021 0000   GLU 102 04/16/2022 0000      Component Value Date/Time   CALCIUM 9.4 02/14/2023 1412   ALKPHOS 81 02/14/2023 1412   AST 13 (L) 02/14/2023 1412   ALT 30 02/14/2023 1412   BILITOT 0.2 02/14/2023 1412     Encounter Diagnoses   Name Primary?   Iron deficiency anemia due to chronic blood loss Yes   Abnormal uterine bleeding    Low folate    Impression and Plan: Ms. Bugay is a very pleasant 38 yo caucasian female with history recurrent left lower extremity DVT and [name]. Hyper coag panel was negative. She now appears to have IDA secondary to heavy menses/chronic hematuria. She has been treated with IV iron.  She also was found to have a low folate level and has been started on oral folic acid.   Today Hgb is up from 9.4 to 10.2. Bleeding has slowed. She will continue the 10 mg Xarelto for now until bleeding fully stops.   RTC Friday for IV iron RTC one week later for IV iron  RTC 2 week APP, labs (CBC), iron   Rushie Chestnut, [place] 2/12/20252:48 PM

## 2023-02-21 ENCOUNTER — Telehealth: Payer: Self-pay | Admitting: Hematology & Oncology

## 2023-02-21 NOTE — Telephone Encounter (Signed)
Left vm for patient to contact our office to schedule a follow up with Clent Jacks.

## 2023-02-22 ENCOUNTER — Inpatient Hospital Stay: Payer: Medicaid Other

## 2023-02-23 DIAGNOSIS — R002 Palpitations: Secondary | ICD-10-CM | POA: Diagnosis not present

## 2023-02-23 DIAGNOSIS — I491 Atrial premature depolarization: Secondary | ICD-10-CM | POA: Diagnosis not present

## 2023-02-25 DIAGNOSIS — R000 Tachycardia, unspecified: Secondary | ICD-10-CM | POA: Diagnosis not present

## 2023-02-25 DIAGNOSIS — I499 Cardiac arrhythmia, unspecified: Secondary | ICD-10-CM | POA: Diagnosis not present

## 2023-02-28 ENCOUNTER — Encounter: Payer: Self-pay | Admitting: Obstetrics and Gynecology

## 2023-02-28 DIAGNOSIS — N939 Abnormal uterine and vaginal bleeding, unspecified: Secondary | ICD-10-CM

## 2023-03-01 ENCOUNTER — Inpatient Hospital Stay: Payer: Medicaid Other

## 2023-03-01 VITALS — BP 99/67 | HR 88 | Temp 98.3°F | Resp 18

## 2023-03-01 DIAGNOSIS — G4700 Insomnia, unspecified: Secondary | ICD-10-CM | POA: Diagnosis not present

## 2023-03-01 DIAGNOSIS — G43109 Migraine with aura, not intractable, without status migrainosus: Secondary | ICD-10-CM | POA: Diagnosis not present

## 2023-03-01 DIAGNOSIS — M5412 Radiculopathy, cervical region: Secondary | ICD-10-CM | POA: Diagnosis not present

## 2023-03-01 DIAGNOSIS — G5603 Carpal tunnel syndrome, bilateral upper limbs: Secondary | ICD-10-CM | POA: Diagnosis not present

## 2023-03-01 DIAGNOSIS — D500 Iron deficiency anemia secondary to blood loss (chronic): Secondary | ICD-10-CM

## 2023-03-01 DIAGNOSIS — M5417 Radiculopathy, lumbosacral region: Secondary | ICD-10-CM | POA: Diagnosis not present

## 2023-03-01 DIAGNOSIS — M542 Cervicalgia: Secondary | ICD-10-CM | POA: Diagnosis not present

## 2023-03-01 DIAGNOSIS — M545 Low back pain, unspecified: Secondary | ICD-10-CM | POA: Diagnosis not present

## 2023-03-01 MED ORDER — SODIUM CHLORIDE 0.9 % IV SOLN: Freq: Once | INTRAVENOUS | Status: AC

## 2023-03-01 MED ORDER — SODIUM CHLORIDE 0.9 % IV SOLN
510.0000 mg | Freq: Once | INTRAVENOUS | Status: AC
  Administered 2023-03-01: 510 mg via INTRAVENOUS
  Filled 2023-03-01: qty 510

## 2023-03-03 ENCOUNTER — Encounter: Payer: Self-pay | Admitting: Family

## 2023-03-04 ENCOUNTER — Telehealth: Payer: Self-pay | Admitting: Medical Oncology

## 2023-03-04 NOTE — Telephone Encounter (Signed)
 Called to move up appt from 2/27 to 2/25. LVM to return call for scheduling.

## 2023-03-05 ENCOUNTER — Other Ambulatory Visit: Payer: Self-pay

## 2023-03-05 ENCOUNTER — Inpatient Hospital Stay: Payer: Medicaid Other

## 2023-03-05 ENCOUNTER — Inpatient Hospital Stay (HOSPITAL_BASED_OUTPATIENT_CLINIC_OR_DEPARTMENT_OTHER): Payer: Medicaid Other | Admitting: Medical Oncology

## 2023-03-05 ENCOUNTER — Encounter: Payer: Self-pay | Admitting: Medical Oncology

## 2023-03-05 ENCOUNTER — Encounter: Payer: Self-pay | Admitting: Obstetrics and Gynecology

## 2023-03-05 VITALS — BP 100/64 | HR 110 | Temp 98.7°F | Resp 19 | Ht 62.0 in | Wt 170.0 lb

## 2023-03-05 DIAGNOSIS — D500 Iron deficiency anemia secondary to blood loss (chronic): Secondary | ICD-10-CM | POA: Diagnosis not present

## 2023-03-05 DIAGNOSIS — N939 Abnormal uterine and vaginal bleeding, unspecified: Secondary | ICD-10-CM

## 2023-03-05 LAB — CBC
HCT: 23.3 % — ABNORMAL LOW (ref 36.0–46.0)
Hemoglobin: 7.3 g/dL — ABNORMAL LOW (ref 12.0–15.0)
MCH: 30.2 pg (ref 26.0–34.0)
MCHC: 31.3 g/dL (ref 30.0–36.0)
MCV: 96.3 fL (ref 80.0–100.0)
Platelets: 471 10*3/uL — ABNORMAL HIGH (ref 150–400)
RBC: 2.42 MIL/uL — ABNORMAL LOW (ref 3.87–5.11)
RDW: 15.4 % (ref 11.5–15.5)
WBC: 11 10*3/uL — ABNORMAL HIGH (ref 4.0–10.5)
nRBC: 0 % (ref 0.0–0.2)

## 2023-03-05 LAB — PREPARE RBC (CROSSMATCH)

## 2023-03-05 LAB — SAMPLE TO BLOOD BANK

## 2023-03-05 MED ORDER — MEGESTROL ACETATE 40 MG PO TABS: 40.0000 mg | ORAL_TABLET | Freq: Two times a day (BID) | ORAL | 5 refills | Status: DC

## 2023-03-05 NOTE — Progress Notes (Signed)
 Hematology and Oncology Follow Up Visit  Yesenia Harper [id number] 1985/05/21 38 y.o. 03/05/2023   Principle Diagnosis:  May-Thurner Syndrome Recurrent DVT in the left lower extremity Iron deficiency anemia    Current Therapy:   Xarelto 20 mg PO daily- adjusted to 10 mg on 02/20/2023 due to bleeding IV iron as indicated- Feraheme 510 B12 IM weekly    Interim History: Yesenia Harper is here today for follow up.   She is here for follow up of IDA secondary to abnormal uterine bleeding following uterine ablation procedure.  She was seen by our office on 02/12/2023 at which time she was passing many blood clots vaginally. Her Hgb had dropped from 11 to 9.4 over the course of 3 days. I advised her to contact the on call GYN or go to the ER as she was symptomatic. Given her history of hypercoagulable state and recurrent DVT we were hesitant to stop her xarelto in hopes that GYN could advise of medication to slow her bleeding while maintaining her anticoagulant protection.   At her 02/14/2023 visit, she reports that her GYN increased her Agestin from 2 tablets daily to 3 tablets daily. Bleeding had reduced with this but has not stopped completely. It was decided to have her reduce her Xarelto from 20 mg to 10 mg at that time until bleeding had stopped for 24 hours. She was also started on folate given her very low folate level.   She contacted our office yesterday- 03/04/2023- stating that her bleeding had increased again. She was brought in for an asap appointment today. She reports vaginal clotting. She is having to use overnight pads and is changing them about 6-7 times per day. She is fatigued.   No petechiae.  No fever, chills, n/v, cough, rash, abdominal pain or changes in bowel or bladder habits.  No falls or syncope reported.   ECOG Performance Status: 1 - Symptomatic but completely ambulatory  Medications:  Allergies as of 03/05/2023       Reactions   Penicillins Itching, Rash    Patient feels itching in mouth.        Medication List        Accurate as of March 05, 2023  1:13 PM. If you have any questions, ask your nurse or doctor.          acetaminophen 500 MG tablet Commonly known as: TYLENOL Take by mouth.   ALPRAZolam 0.25 MG tablet Commonly known as: XANAX Take 0.25 mg by mouth 2 (two) times daily as needed for anxiety.   B-D 3CC LUER-LOK SYR 25GX1/2" 25G X 1-1/2" 3 ML Misc Generic drug: SYRINGE-NEEDLE (DISP) 3 ML Inject 1 each into the muscle once a week.   Botox 200 units injection Generic drug: botulinum toxin Type A Inject 200 Units into the muscle every 3 (three) months.   buPROPion HCl ER (XL) 450 MG Tb24 Take 1 tablet by mouth daily.   cyanocobalamin 1000 MCG/ML injection Commonly known as: VITAMIN B12 Inject 1,000 mcg into the muscle every Wednesday.   etonogestrel 68 MG Impl implant Commonly known as: NEXPLANON Inject 1 each into the skin once.   folic acid 1 MG tablet Commonly known as: FOLVITE Take 1 tablet (1 mg total) by mouth daily.   furosemide 20 MG tablet Commonly known as: LASIX Take 20 mg by mouth in the morning.   hydroxychloroquine 200 MG tablet Commonly known as: PLAQUENIL Take 400 mg by mouth daily.   levothyroxine 75 MCG tablet Commonly known as:  SYNTHROID Take one tablet by mouth daily on Mondays, Tuesdays, Wednesdays, Thursdays and Fridays.  Take one and one-half tablet on Saturdays and Sundays. NEEDS APPOINTMENT FOR FURTHER REFILLS.   linaclotide 290 MCG Caps capsule Commonly known as: LINZESS Take 290 mcg by mouth daily.   liothyronine 5 MCG tablet Commonly known as: CYTOMEL Take 5 mcg by mouth daily.   lurasidone 40 MG Tabs tablet Commonly known as: LATUDA Take 40 mg by mouth every evening.   meclizine 25 MG tablet Commonly known as: ANTIVERT Take 25 mg by mouth 3 (three) times daily as needed.   megestrol 40 MG tablet Commonly known as: MEGACE Take 1 tablet (40 mg total) by mouth  2 (two) times daily. Can increase to two tablets twice a day in the event of heavy bleeding   MULTIVITAMIN ADULT PO Take 1 tablet by mouth in the morning.   norethindrone 5 MG tablet Commonly known as: AYGESTIN Take 3 tablets (15 mg total) by mouth daily.   Nurtec 75 MG Tbdp Generic drug: Rimegepant Sulfate Take 75 mg by mouth daily as needed for migraine.   ondansetron 8 MG disintegrating tablet Commonly known as: ZOFRAN-ODT TAKE 1 TABLET BY MOUTH EVERY 8 HOURS AS NEEDED FOR NAUSEA   promethazine 12.5 MG tablet Commonly known as: PHENERGAN Take 12.5 mg by mouth every 4 (four) hours as needed.   Qulipta 60 MG Tabs Generic drug: Atogepant Take 60 mg by mouth daily.   rivaroxaban 20 MG Tabs tablet Commonly known as: XARELTO Take 1 tablet (20 mg total) by mouth daily with supper. What changed: Another medication with the same name was removed. Continue taking this medication, and follow the directions you see here. Changed by: Rushie Chestnut   spironolactone 25 MG tablet Commonly known as: ALDACTONE Take 25 mg by mouth in the morning.   Tiadylt ER 120 MG 24 hr capsule Generic drug: diltiazem Take 120 mg by mouth daily.   Tiadylt ER 120 MG 24 hr capsule Generic drug: diltiazem Take 2 capsules by mouth daily.   traZODone 100 MG tablet Commonly known as: DESYREL Take 100 mg by mouth at bedtime.   Vilazodone HCl 40 MG Tabs Commonly known as: Viibryd Take 1 tablet (40 mg total) by mouth daily.   Vitamin D (Ergocalciferol) 1.25 MG (50000 UNIT) Caps capsule Commonly known as: DRISDOL Take 50,000 Units by mouth every Thursday.   zolpidem 10 MG tablet Commonly known as: AMBIEN Take 10 mg by mouth at bedtime as needed.        Allergies:  Allergies  Allergen Reactions   Penicillins Itching and Rash    Patient feels itching in mouth.    Past Medical History, Surgical history, Social history, and Family History were reviewed and updated.  Review of  Systems: All other 10 point review of systems is negative.   Physical Exam:  height is 5\' 2"  (1.575 m) and weight is 170 lb (77.1 kg). Her oral temperature is 98.7 F (37.1 C). Her blood pressure is 100/64 and her pulse is 110 (abnormal). Her respiration is 19 and oxygen saturation is 100%.   Wt Readings from Last 3 Encounters:  03/05/23 170 lb (77.1 kg)  02/20/23 173 lb (78.5 kg)  02/14/23 174 lb (78.9 kg)   Constitutional: Well appearing.  Ocular: Sclerae unicteric, pupils equal, round and reactive to light Ear-nose-throat: Oropharynx clear, dentition fair Lymphatic: No cervical or supraclavicular adenopathy Lungs no rales or rhonchi, good excursion bilaterally Heart regular rate and rhythm, no  murmur appreciated Abd soft, nontender, positive bowel sounds MSK no focal spinal tenderness, no joint edema Neuro: non-focal, well-oriented, appropriate affect  Lab Results  Component Value Date   WBC 11.0 (H) 03/05/2023   HGB 7.3 (L) 03/05/2023   HCT 23.3 (L) 03/05/2023   MCV 96.3 03/05/2023   PLT 471 (H) 03/05/2023   Lab Results  Component Value Date   FERRITIN 46 02/14/2023   IRON 26 (L) 02/14/2023   TIBC 281 02/14/2023   UIBC 255 02/14/2023   IRONPCTSAT 9 (L) 02/14/2023   Lab Results  Component Value Date   RETICCTPCT 4.1 (H) 02/12/2023   RBC 2.42 (L) 03/05/2023   No results found for: "KPAFRELGTCHN", "LAMBDASER", "KAPLAMBRATIO" No results found for: "IGGSERUM", "IGA", "IGMSERUM" No results found for: "TOTALPROTELP", "ALBUMINELP", "A1GS", "A2GS", "BETS", "BETA2SER", "GAMS", "MSPIKE", "SPEI"   Chemistry      Component Value Date/Time   NA 142 02/14/2023 1412   NA 138 04/16/2022 0000   K 3.7 02/14/2023 1412   CL 106 02/14/2023 1412   CO2 28 02/14/2023 1412   BUN 10 02/14/2023 1412   BUN 13 04/16/2022 0000   CREATININE 0.70 02/14/2023 1412   CREATININE 0.78 05/12/2021 0000   GLU 102 04/16/2022 0000      Component Value Date/Time   CALCIUM 9.4 02/14/2023 1412    ALKPHOS 81 02/14/2023 1412   AST 13 (L) 02/14/2023 1412   ALT 30 02/14/2023 1412   BILITOT 0.2 02/14/2023 1412     Encounter Diagnosis  Name Primary?   Abnormal uterine bleeding Yes   Impression and Plan: Ms. Livingstone is a very pleasant 38 yo caucasian female with history recurrent left lower extremity DVT and [name]. Hyper coag panel was negative. Her most recent DVT was in Dec 2023. She now appears to have IDA secondary to heavy menses/chronic hematuria/abnormal uterine bleeding. She has been treated with IV iron.  She also was found to have a low folate level and has been started on oral folic acid. Despite all of this bleeding has worsened.   We discussed risks/benefits with stopping her xarelto- at this time we have elected to stop this medication in hopes of slowing down her bleeding. We have also contacted her GYN who is going to reach out to patient today with further medication adjustments and advisement. We will get her set up for a blood transfusion here tomorrow. Risks and education performed and consent obtained. Should she have any red flag signs and symptoms or blood loss worsen she will go to the emergency room or call 911.   RTC Tomorrow for 1 unit of blood.  RTC Friday APP, labs (CBC, iron, ferritin, sample)  Rushie Chestnut, PA-C 2/25/20251:13 PM

## 2023-03-06 ENCOUNTER — Encounter: Payer: Self-pay | Admitting: Hematology & Oncology

## 2023-03-06 ENCOUNTER — Inpatient Hospital Stay: Payer: Medicaid Other

## 2023-03-06 DIAGNOSIS — D500 Iron deficiency anemia secondary to blood loss (chronic): Secondary | ICD-10-CM | POA: Diagnosis not present

## 2023-03-06 DIAGNOSIS — N939 Abnormal uterine and vaginal bleeding, unspecified: Secondary | ICD-10-CM

## 2023-03-06 MED ORDER — DIPHENHYDRAMINE HCL 25 MG PO CAPS: 25.0000 mg | ORAL_CAPSULE | Freq: Once | ORAL | Status: DC

## 2023-03-06 MED ORDER — ACETAMINOPHEN 325 MG PO TABS: 650.0000 mg | ORAL_TABLET | Freq: Once | ORAL | Status: DC

## 2023-03-06 MED ORDER — SODIUM CHLORIDE 0.9% IV SOLUTION
250.0000 mL | INTRAVENOUS | Status: DC
  Administered 2023-03-06: 250 mL via INTRAVENOUS

## 2023-03-06 NOTE — Patient Instructions (Signed)

## 2023-03-07 ENCOUNTER — Inpatient Hospital Stay: Payer: Medicaid Other | Admitting: Medical Oncology

## 2023-03-07 ENCOUNTER — Inpatient Hospital Stay: Payer: Medicaid Other

## 2023-03-07 LAB — TYPE AND SCREEN
ABO/RH(D): O POS
Antibody Screen: NEGATIVE
Unit division: 0

## 2023-03-07 LAB — BPAM RBC
Blood Product Expiration Date: 202503262359
ISSUE DATE / TIME: 202502260759
Unit Type and Rh: 202503262359
Unit Type and Rh: 5100

## 2023-03-08 ENCOUNTER — Inpatient Hospital Stay: Payer: Medicaid Other

## 2023-03-08 ENCOUNTER — Encounter: Payer: Self-pay | Admitting: Family

## 2023-03-08 ENCOUNTER — Inpatient Hospital Stay: Payer: Medicaid Other | Admitting: Family

## 2023-03-08 ENCOUNTER — Telehealth: Payer: Self-pay | Admitting: Obstetrics and Gynecology

## 2023-03-08 ENCOUNTER — Encounter: Payer: Self-pay | Admitting: *Deleted

## 2023-03-08 VITALS — BP 101/67 | HR 98 | Temp 98.9°F | Resp 17

## 2023-03-08 DIAGNOSIS — D500 Iron deficiency anemia secondary to blood loss (chronic): Secondary | ICD-10-CM

## 2023-03-08 DIAGNOSIS — D649 Anemia, unspecified: Secondary | ICD-10-CM | POA: Diagnosis not present

## 2023-03-08 DIAGNOSIS — N939 Abnormal uterine and vaginal bleeding, unspecified: Secondary | ICD-10-CM

## 2023-03-08 LAB — SAMPLE TO BLOOD BANK

## 2023-03-08 LAB — CBC
HCT: 24.9 % — ABNORMAL LOW (ref 36.0–46.0)
Hemoglobin: 7.7 g/dL — ABNORMAL LOW (ref 12.0–15.0)
MCH: 29.5 pg (ref 26.0–34.0)
MCHC: 30.9 g/dL (ref 30.0–36.0)
MCV: 95.4 fL (ref 80.0–100.0)
Platelets: 439 10*3/uL — ABNORMAL HIGH (ref 150–400)
RBC: 2.61 MIL/uL — ABNORMAL LOW (ref 3.87–5.11)
RDW: 19 % — ABNORMAL HIGH (ref 11.5–15.5)
WBC: 7.7 10*3/uL (ref 4.0–10.5)
nRBC: 0 % (ref 0.0–0.2)

## 2023-03-08 LAB — FERRITIN: Ferritin: 134 ng/mL (ref 11–307)

## 2023-03-08 LAB — PREPARE RBC (CROSSMATCH)

## 2023-03-08 MED ORDER — SODIUM CHLORIDE 0.9 % IV SOLN
Freq: Once | INTRAVENOUS | Status: AC
Start: 2023-03-08 — End: 2023-03-08

## 2023-03-08 MED ORDER — SODIUM CHLORIDE 0.9 % IV SOLN
510.0000 mg | Freq: Once | INTRAVENOUS | Status: AC
  Administered 2023-03-08: 510 mg via INTRAVENOUS
  Filled 2023-03-08: qty 510

## 2023-03-08 NOTE — Progress Notes (Signed)
 This nurse left patient's PIV in right forearm because she is going to [facility] for a blood transfusion. PIV has an extension and flushed fine after iron transfusion today.  Wrapped in gauze and coban.

## 2023-03-08 NOTE — Telephone Encounter (Signed)
 GYN Telephone Note I was called by her Hematology office stating the patient has been having constant bleeding and got transfused today and plan for another one tomorrow. I told them that if she is bleeding that much that she needs to be seen in the MAU for evaluation and to please send her.  I sent a message to the office to get her in an ASAP appointment this upcoming week with an MD that does surgery.   Cornelia Copa MD [facility]ending [facility] (Faculty Practice) 03/08/2023 Time: 1600

## 2023-03-08 NOTE — Progress Notes (Signed)
 Hematology and Oncology Follow Up Visit  Yesenia Harper [id number] 1985-03-11 38 y.o. 03/08/2023   Principle Diagnosis:  May-Thurner Syndrome Recurrent DVT in the left lower extremity Iron deficiency anemia     Current Therapy:        Xarelto 20 mg PO daily- adjusted to 10 mg on 02/20/2023 due to bleeding - stopped due to heavy menses 03/05/2023 Tranfusional support as needed IV iron as indicated B12 IM weekly    Interim History:  [name] is here today with her husband for follow up and another dose of IV iron. She is really having a hard time.  Unfortunately she had a failed uterine artery embolization.  Rondel Baton is on hold since earlier this week. She continues to bleed heavily and noted large clots.  She states that the Aygestin didn't help. She switched to Megace ad still does not noted any improvement. She states that she is taking as prescribed.  Iron saturation is 9% and ferritin 46.   She is symptomatic with fatigue, weakness, dizziness, palpitations, headache and numbness and tingling in her fingertips.  She has not noted any other blood loss. No bruising or petechiae.  No swelling in her extremities.  No falls or syncope so far.  Appetite is fair and hydration is good. She states that she has tried going to the ED for her issues but was referred back to gynecology. Her appointment with their office is on 03/21/2023.   ECOG Performance Status: 1 - Symptomatic but completely ambulatory  Medications:  Allergies as of 03/08/2023       Reactions   Penicillins Itching, Rash   Patient feels itching in mouth.        Medication List        Accurate as of March 08, 2023  4:53 PM. If you have any questions, ask your nurse or doctor.          acetaminophen 500 MG tablet Commonly known as: TYLENOL Take by mouth.   ALPRAZolam 0.25 MG tablet Commonly known as: XANAX Take 0.25 mg by mouth 2 (two) times daily as needed for anxiety.   B-D 3CC LUER-LOK SYR 25GX1/2"  25G X 1-1/2" 3 ML Misc Generic drug: SYRINGE-NEEDLE (DISP) 3 ML Inject 1 each into the muscle once a week.   Botox 200 units injection Generic drug: botulinum toxin Type A Inject 200 Units into the muscle every 3 (three) months.   buPROPion HCl ER (XL) 450 MG Tb24 Take 1 tablet by mouth daily.   cyanocobalamin 1000 MCG/ML injection Commonly known as: VITAMIN B12 Inject 1,000 mcg into the muscle every Wednesday.   etonogestrel 68 MG Impl implant Commonly known as: NEXPLANON Inject 1 each into the skin once.   folic acid 1 MG tablet Commonly known as: FOLVITE Take 1 tablet (1 mg total) by mouth daily.   furosemide 20 MG tablet Commonly known as: LASIX Take 20 mg by mouth in the morning.   hydroxychloroquine 200 MG tablet Commonly known as: PLAQUENIL Take 400 mg by mouth daily.   levothyroxine 75 MCG tablet Commonly known as: SYNTHROID Take one tablet by mouth daily on Mondays, Tuesdays, Wednesdays, Thursdays and Fridays.  Take one and one-half tablet on Saturdays and Sundays. NEEDS APPOINTMENT FOR FURTHER REFILLS.   linaclotide 290 MCG Caps capsule Commonly known as: LINZESS Take 290 mcg by mouth daily.   liothyronine 5 MCG tablet Commonly known as: CYTOMEL Take 5 mcg by mouth daily.   lurasidone 40 MG Tabs tablet Commonly known as:  LATUDA Take 40 mg by mouth every evening.   meclizine 25 MG tablet Commonly known as: ANTIVERT Take 25 mg by mouth 3 (three) times daily as needed.   megestrol 40 MG tablet Commonly known as: MEGACE Take 1 tablet (40 mg total) by mouth 2 (two) times daily. Can increase to two tablets twice a day in the event of heavy bleeding   MULTIVITAMIN ADULT PO Take 1 tablet by mouth in the morning.   norethindrone 5 MG tablet Commonly known as: AYGESTIN Take 3 tablets (15 mg total) by mouth daily.   Nurtec 75 MG Tbdp Generic drug: Rimegepant Sulfate Take 75 mg by mouth daily as needed for migraine.   ondansetron 8 MG disintegrating  tablet Commonly known as: ZOFRAN-ODT TAKE 1 TABLET BY MOUTH EVERY 8 HOURS AS NEEDED FOR NAUSEA   promethazine 12.5 MG tablet Commonly known as: PHENERGAN Take 12.5 mg by mouth every 4 (four) hours as needed.   Qulipta 60 MG Tabs Generic drug: Atogepant Take 60 mg by mouth daily.   rivaroxaban 20 MG Tabs tablet Commonly known as: XARELTO Take 1 tablet (20 mg total) by mouth daily with supper.   spironolactone 25 MG tablet Commonly known as: ALDACTONE Take 25 mg by mouth in the morning.   Tiadylt ER 120 MG 24 hr capsule Generic drug: diltiazem Take 120 mg by mouth daily.   Tiadylt ER 120 MG 24 hr capsule Generic drug: diltiazem Take 2 capsules by mouth daily.   traZODone 100 MG tablet Commonly known as: DESYREL Take 100 mg by mouth at bedtime.   Vilazodone HCl 40 MG Tabs Commonly known as: Viibryd Take 1 tablet (40 mg total) by mouth daily.   Vitamin D (Ergocalciferol) 1.25 MG (50000 UNIT) Caps capsule Commonly known as: DRISDOL Take 50,000 Units by mouth every Thursday.   zolpidem 10 MG tablet Commonly known as: AMBIEN Take 10 mg by mouth at bedtime as needed.        Allergies:  Allergies  Allergen Reactions   Penicillins Itching and Rash    Patient feels itching in mouth.    Past Medical History, Surgical history, Social history, and Family History were reviewed and updated.  Review of Systems: All other 10 point review of systems is negative.   Physical Exam:  oral temperature is 98.9 F (37.2 C). Her blood pressure is 101/67 and her pulse is 98. Her respiration is 17 and oxygen saturation is 100%.   Wt Readings from Last 3 Encounters:  03/05/23 170 lb (77.1 kg)  02/20/23 173 lb (78.5 kg)  02/14/23 174 lb (78.9 kg)    Ocular: Sclerae unicteric, pupils equal, round and reactive to light Ear-nose-throat: Oropharynx clear, dentition fair Lymphatic: No cervical or supraclavicular adenopathy Lungs no rales or rhonchi, good excursion  bilaterally Heart regular rate and rhythm, no murmur appreciated Abd soft, nontender, positive bowel sounds MSK no focal spinal tenderness, no joint edema Neuro: non-focal, well-oriented, appropriate affect Breasts: Deferred   Lab Results  Component Value Date   WBC 7.7 03/08/2023   HGB 7.7 (L) 03/08/2023   HCT 24.9 (L) 03/08/2023   MCV 95.4 03/08/2023   PLT 439 (H) 03/08/2023   Lab Results  Component Value Date   FERRITIN 46 02/14/2023   IRON 26 (L) 02/14/2023   TIBC 281 02/14/2023   UIBC 255 02/14/2023   IRONPCTSAT 9 (L) 02/14/2023   Lab Results  Component Value Date   RETICCTPCT 4.1 (H) 02/12/2023   RBC 2.61 (L) 03/08/2023  No results found for: "KPAFRELGTCHN", "LAMBDASER", "KAPLAMBRATIO" No results found for: "IGGSERUM", "IGA", "IGMSERUM" No results found for: "TOTALPROTELP", "ALBUMINELP", "A1GS", "A2GS", "BETS", "BETA2SER", "GAMS", "MSPIKE", "SPEI"   Chemistry      Component Value Date/Time   NA 142 02/14/2023 1412   NA 138 04/16/2022 0000   K 3.7 02/14/2023 1412   CL 106 02/14/2023 1412   CO2 28 02/14/2023 1412   BUN 10 02/14/2023 1412   BUN 13 04/16/2022 0000   CREATININE 0.70 02/14/2023 1412   CREATININE 0.78 05/12/2021 0000   GLU 102 04/16/2022 0000      Component Value Date/Time   CALCIUM 9.4 02/14/2023 1412   ALKPHOS 81 02/14/2023 1412   AST 13 (L) 02/14/2023 1412   ALT 30 02/14/2023 1412   BILITOT 0.2 02/14/2023 1412       Impression and Plan: Ms. Height is a very pleasant 38 yo caucasian female with history recurrent left lower extremity DVT and [name]. Hyper coag panel was negative. Her most recent DVT was in Dec 2023.  She now has IDA secondary to heavy cycles. She continues to have heavy bleeding and is symptomatic as mentioned above.  She received IV iron last week and a unit of blood earlier this week.  Hgb and symptoms have not improved. We will give her another dose of IV iron today and she will go to [facility] tomorrow  for 2 more units of blood.  I reached out to the on call Dr with her gynecology group as the office is closed. We will check with the Monday to see if her appointment can be moved up sooner.    Eileen Stanford, NP 2/28/20254:53 PM

## 2023-03-08 NOTE — Patient Instructions (Signed)

## 2023-03-09 ENCOUNTER — Inpatient Hospital Stay: Payer: Medicaid Other | Attending: Medical Oncology

## 2023-03-09 DIAGNOSIS — N939 Abnormal uterine and vaginal bleeding, unspecified: Secondary | ICD-10-CM

## 2023-03-09 DIAGNOSIS — Z7901 Long term (current) use of anticoagulants: Secondary | ICD-10-CM | POA: Insufficient documentation

## 2023-03-09 DIAGNOSIS — D500 Iron deficiency anemia secondary to blood loss (chronic): Secondary | ICD-10-CM | POA: Diagnosis present

## 2023-03-09 DIAGNOSIS — Z86718 Personal history of other venous thrombosis and embolism: Secondary | ICD-10-CM | POA: Diagnosis not present

## 2023-03-09 DIAGNOSIS — D649 Anemia, unspecified: Secondary | ICD-10-CM

## 2023-03-09 DIAGNOSIS — N920 Excessive and frequent menstruation with regular cycle: Secondary | ICD-10-CM | POA: Diagnosis present

## 2023-03-09 MED ORDER — FUROSEMIDE 10 MG/ML IJ SOLN
20.0000 mg | Freq: Once | INTRAMUSCULAR | Status: AC
  Administered 2023-03-09: 20 mg via INTRAVENOUS
  Filled 2023-03-09: qty 2

## 2023-03-09 MED ORDER — DIPHENHYDRAMINE HCL 25 MG PO CAPS
25.0000 mg | ORAL_CAPSULE | Freq: Once | ORAL | Status: AC
  Administered 2023-03-09: 25 mg via ORAL
  Filled 2023-03-09: qty 1

## 2023-03-09 MED ORDER — ACETAMINOPHEN 325 MG PO TABS
650.0000 mg | ORAL_TABLET | Freq: Once | ORAL | Status: AC
  Administered 2023-03-09: 650 mg via ORAL
  Filled 2023-03-09: qty 2

## 2023-03-09 MED ORDER — SODIUM CHLORIDE 0.9% IV SOLUTION
250.0000 mL | INTRAVENOUS | Status: DC
  Administered 2023-03-09: 100 mL via INTRAVENOUS

## 2023-03-11 ENCOUNTER — Telehealth: Payer: Self-pay | Admitting: Family

## 2023-03-11 LAB — BPAM RBC
Blood Product Expiration Date: 202503282359
Blood Product Expiration Date: 202503282359
ISSUE DATE / TIME: 202503010914
ISSUE DATE / TIME: 202503010914
Unit Type and Rh: 5100
Unit Type and Rh: 5100

## 2023-03-11 LAB — TYPE AND SCREEN
ABO/RH(D): O POS
Antibody Screen: NEGATIVE
Unit division: 0
Unit division: 0

## 2023-03-11 LAB — IRON AND IRON BINDING CAPACITY (CC-WL,HP ONLY)
Iron: 44 ug/dL (ref 28–170)
Saturation Ratios: 14 % (ref 10.4–31.8)
TIBC: 305 ug/dL (ref 250–450)
UIBC: 261 ug/dL (ref 148–442)

## 2023-03-11 NOTE — Telephone Encounter (Signed)
 Called and left a vm for the pt to schedule appointment based off patient's los from 03/08/23.

## 2023-03-13 ENCOUNTER — Ambulatory Visit: Admitting: Obstetrics and Gynecology

## 2023-03-13 ENCOUNTER — Other Ambulatory Visit (HOSPITAL_COMMUNITY)
Admission: RE | Admit: 2023-03-13 | Discharge: 2023-03-13 | Disposition: A | Discharge status: Home / Self Care | Source: Ambulatory Visit | Attending: Obstetrics and Gynecology | Admitting: Obstetrics and Gynecology

## 2023-03-13 ENCOUNTER — Other Ambulatory Visit: Admitting: Obstetrics and Gynecology

## 2023-03-13 ENCOUNTER — Encounter: Payer: Self-pay | Admitting: Obstetrics and Gynecology

## 2023-03-13 VITALS — BP 107/73 | HR 91 | Ht 62.0 in | Wt 171.0 lb

## 2023-03-13 DIAGNOSIS — N939 Abnormal uterine and vaginal bleeding, unspecified: Secondary | ICD-10-CM

## 2023-03-13 DIAGNOSIS — Z3202 Encounter for pregnancy test, result negative: Secondary | ICD-10-CM | POA: Diagnosis not present

## 2023-03-13 DIAGNOSIS — Z01812 Encounter for preprocedural laboratory examination: Secondary | ICD-10-CM

## 2023-03-13 LAB — POCT URINE PREGNANCY: Preg Test, Ur: NEGATIVE

## 2023-03-13 NOTE — H&P (View-Only) (Signed)
 RETURN GYNECOLOGY VISIT  Subjective:  Yesenia Harper is a 38 y.o. [phone] with likely adenomyosis s/p [place] presenting for follow up of persistent heavy vaginal bleeding  Pt has hx [name]/VTE on chronic anticoagulation and has been following with Dr. Para March for AUB since June. She has a Nexplanon in place that she likes for contraception and she has completed child bearing. She has tried IUD without success and is s/p recent [place] on 01/20/23.   Since her [place], she reports ongoing HVB despite aygestin 10mg  and then 15mg  daily. She has required blood transfusions with hematology due to severity of bleeding. She recently switched to megace 40mg  BID and stopped her xarelto. She reports that her bleeding has stopped for the past two days.   She presents today for EMB and next steps in plan of care  Of note, her sister recently died unexpectedly in a house fire and her daughter was attacked by their family dog.   Past Medical History:  Diagnosis Date   Anxiety    Anxiety with depression 08/03/2019   Bilateral carpal tunnel syndrome 02/23/2011   Cervical radiculitis 09/17/2013   CHF (congestive heart failure) (HCC)    Right sided   Chiari malformation type I (HCC) 04/28/2013   Cognitive impairment, mild, so stated 05/23/2014   Degenerative joint disease of cervical and lumbar spine 05/23/2014   Delivery of pregnancy by cesarean section    Depression    Dry cough 08/03/2019   Dysrhythmia    Epidermal inclusion cyst 04/10/2012   Eye pain 04/24/2013   Fever of unknown origin (FUO) 11/05/2014   Fluid retention in legs 06/06/2021   Gastrointestinal symptoms 06/06/2021   Formatting of this note might be different from the original.     Last Assessment & Plan:  Formatting of this note might be different from the original.   Hematuria 12/18/2019   History of C-section 05/12/2014   Formatting of this note might be different from the original. 2006 LTCS for breech & NRFHR   History  of DVT (deep vein thrombosis) 07/05/2020   Hypothyroidism    Lumbar radiculopathy 09/17/2013   May-Thurner syndrome 12/13/2020   MDD (major depressive disorder), recurrent episode, mild (HCC) 04/04/2012   Migraine headache without aura 04/10/2012   Myopia of both eyes 04/24/2013   Neck pain 09/17/2013   Neuropathy, median nerve 09/17/2013   Pain in joint involving left lower leg 07/05/2020   Pain of left lower extremity 07/05/2020   Scoliosis 12/18/2019   Visual disturbance 04/24/2013   Past Surgical History:  Procedure Laterality Date   CESAREAN SECTION     x2   IR ANGIOGRAM PELVIS SELECTIVE OR SUPRASELECTIVE  01/29/2023   IR ANGIOGRAM PELVIS SELECTIVE OR SUPRASELECTIVE  01/29/2023   IR ANGIOGRAM SELECTIVE EACH ADDITIONAL VESSEL  01/29/2023   IR EMBO TUMOR ORGAN ISCHEMIA INFARCT INC GUIDE ROADMAPPING  01/29/2023   IR FLUORO GUIDED NEEDLE PLC ASPIRATION/INJECTION LOC  01/29/2023   IR RADIOLOGIST EVAL & MGMT  08/23/2020   IR RADIOLOGIST EVAL & MGMT  05/30/2021   IR RADIOLOGIST EVAL & MGMT  10/26/2021   IR RADIOLOGIST EVAL & MGMT  01/03/2023   IR US GUIDE VASC ACCESS RIGHT  01/29/2023   VISCERAL ANGIOGRAPHY N/A 10/16/2021   Procedure: MESENTRIC ANGIOGRAPHY;  Surgeon: Maeola Harman, MD;  Location: [facility] INVASIVE CV LAB;  Service: Cardiovascular;  Laterality: N/A;   Current Outpatient Medications on File Prior to Visit  Medication Sig Dispense Refill   acetaminophen (TYLENOL) 500  MG tablet Take 1,000 mg by mouth every 6 (six) hours as needed for mild pain (pain score 1-3) or moderate pain (pain score 4-6).     ALPRAZolam (XANAX) 0.25 MG tablet Take 0.25 mg by mouth 3 (three) times daily as needed for anxiety.     B-D 3CC LUER-LOK SYR 25GX1/2" 25G X 1-1/2" 3 ML MISC Inject 1 each into the muscle once a week.     BOTOX 200 units injection Inject 200 Units into the muscle every 3 (three) months.     buPROPion HCl ER, XL, 450 MG TB24 Take 450 mg by mouth daily.     cyanocobalamin  (VITAMIN B12) 1000 MCG/ML injection Inject 1,000 mcg into the muscle every Wednesday.     diltiazem (TIADYLT ER) 120 MG 24 hr capsule Take 240 mg by mouth daily.     etonogestrel (NEXPLANON) 68 MG IMPL implant Inject 1 each into the skin once.     folic acid (FOLVITE) 1 MG tablet Take 1 tablet (1 mg total) by mouth daily. 30 tablet 6   hydroxychloroquine (PLAQUENIL) 200 MG tablet Take 400 mg by mouth daily.     levothyroxine (SYNTHROID) 75 MCG tablet Take one tablet by mouth daily on Mondays, Tuesdays, Wednesdays, Thursdays and Fridays.  Take one and one-half tablet on Saturdays and Sundays. NEEDS APPOINTMENT FOR FURTHER REFILLS. 102 tablet 0   linaclotide (LINZESS) 290 MCG CAPS capsule Take 290 mcg by mouth daily.     lurasidone (LATUDA) 40 MG TABS tablet Take 40 mg by mouth every evening.     meclizine (ANTIVERT) 25 MG tablet Take 25 mg by mouth 3 (three) times daily as needed.     megestrol (MEGACE) 40 MG tablet Take 1 tablet (40 mg total) by mouth 2 (two) times daily. Can increase to two tablets twice a day in the event of heavy bleeding 60 tablet 5   Multiple Vitamin (MULTIVITAMIN ADULT PO) Take 1 tablet by mouth in the morning.     norethindrone (AYGESTIN) 5 MG tablet Take 3 tablets (15 mg total) by mouth daily. (Patient not taking: Reported on 03/14/2023) 30 tablet 2   NURTEC 75 MG TBDP Take 75 mg by mouth daily as needed for migraine.     ondansetron (ZOFRAN-ODT) 8 MG disintegrating tablet TAKE 1 TABLET BY MOUTH EVERY 8 HOURS AS NEEDED FOR NAUSEA 60 tablet 0   promethazine (PHENERGAN) 12.5 MG tablet Take 12.5 mg by mouth every 4 (four) hours as needed.     QULIPTA 60 MG TABS Take 60 mg by mouth daily.     rivaroxaban (XARELTO) 20 MG TABS tablet Take 1 tablet (20 mg total) by mouth daily with supper. 30 tablet 11   spironolactone (ALDACTONE) 25 MG tablet Take 25 mg by mouth daily.     TIADYLT ER 120 MG 24 hr capsule Take 240 mg by mouth daily.     Vilazodone HCl (VIIBRYD) 40 MG TABS Take 1  tablet (40 mg total) by mouth daily. 90 tablet 1   Vitamin D, Ergocalciferol, (DRISDOL) 1.25 MG (50000 UNIT) CAPS capsule Take 50,000 Units by mouth every Thursday.     zolpidem (AMBIEN) 10 MG tablet Take 10 mg by mouth at bedtime.     No current facility-administered medications on file prior to visit.   Allergies  Allergen Reactions   Penicillins Itching and Rash    Patient feels itching in mouth.   OB History     Gravida  2   Para  2  Term  2   Preterm      AB      Living  2      SAB      IAB      Ectopic      Multiple      Live Births             Social History   Socioeconomic History   Marital status: Married    Spouse name: Not on file   Number of children: 2   Years of education: Not on file   Highest education level: Not on file  Occupational History   Not on file  Tobacco Use   Smoking status: Never   Smokeless tobacco: Never  Vaping Use   Vaping status: Never Used  Substance and Sexual Activity   Alcohol use: Not Currently   Drug use: Never   Sexual activity: Yes    Partners: Male    Birth control/protection: Implant  Other Topics Concern   Not on file  Social History Narrative   Not on file   Social Drivers of Health   Financial Resource Strain: Patient Declined (08/12/2022)   Received from [facility]   Overall Financial Resource Strain (CARDIA)    Difficulty of Paying Living Expenses: Patient declined  Food Insecurity: Patient Declined (08/12/2022)   Received from [facility]   Hunger Vital Sign    Worried About Running Out of Food in the Last Year: Patient declined    Ran Out of Food in the Last Year: Patient declined  Transportation Needs: Patient Declined (08/12/2022)   Received [facility] - Transportation    Lack of Transportation (Medical): Patient declined    Lack of Transportation (Non-Medical): Patient declined  Physical Activity: Unknown (08/12/2022)   Received from [facility]   Exercise Vital  Sign    Days of Exercise per Week: Patient declined    Minutes of Exercise per Session: Not on file  Stress: Patient Declined (08/12/2022)   Received from [facility] - Occupational Stress Questionnaire    Feeling of Stress : Patient declined  Social Connections: Unknown (05/08/2021)   Received from [facility]   Social Network    Social Network: Not on file  Intimate Partner Violence: Not At Risk (08/12/2022)   Received from [facility]   HITS    Over the last 12 months how often did your partner physically hurt you?: Never    Over the last 12 months how often did your partner insult you or talk down to you?: Never    Over the last 12 months how often did your partner threaten you with physical harm?: Never    Over the last 12 months how often did your partner scream or curse at you?: Never   Objective:   Vitals:   03/13/23 1533  BP: 107/73  Pulse: 91  Weight: 171 lb (77.6 kg)  Height: 5\' 2"  (1.575 m)   General:  Alert, oriented and cooperative. Patient is in no acute distress.  Skin: Skin is warm and dry. No rash noted.   Cardiovascular: Normal heart rate noted  Respiratory: Normal respiratory effort, no problems with respiration noted   Assessment and Plan:  Jaileen Janelle is a 38 y.o. with refractory AUB  Refractory AUB - Likely due to combination of adenomyosis, Nexplanon and anticoagulation - EMB performed today without complication - Discussed removing Nexplanon and using alternative contraception that  isn't associated with irregularly irregular bleeding. She declines.  - We discussed that I have OR availability to do an endometrial ablation on Friday. Reviewed that this may be a temporizing procedure with higher failure rates in adenomyosis & women age < 38, but this should still significantly slow her bleeding down. Compared to hysterectomy it has lower surgical risk, shorter recovery time, and shorter  procedure length allowing it to be scheduled ASAP - Risks of endometrial ablation were reviewed including pain, infection, bleeding, injury to surrounding organs, thermal injury, uterine perforation, inability to complete the procedure, or unsatisfactory results on her symptoms. Reviewed anticipate recovery and post op restrictions. - OR request sent - Continue megace 40mg  BID: -     Surgical pathology( [place]/ [facility]) -     Ambulatory Referral For Surgery Scheduling -     POCT urine pregnancy  Lennart Pall, MD

## 2023-03-13 NOTE — Progress Notes (Unsigned)
 RETURN GYNECOLOGY VISIT  Subjective:  Yesenia Harper is a 38 y.o. [phone] with likely adenomyosis s/p [place] presenting for follow up of persistent heavy vaginal bleeding  Pt has hx [name]/VTE on chronic anticoagulation and has been following with Dr. Para March for AUB since June. She has a Nexplanon in place that she likes for contraception and she has completed child bearing. She has tried IUD without success and is s/p recent [place] on 01/20/23.   Since her [place], she reports ongoing HVB despite aygestin 10mg  and then 15mg  daily. She has required blood transfusions with hematology due to severity of bleeding. She recently switched to megace 40mg  BID and stopped her xarelto. She reports that her bleeding has stopped for the past two days.   She presents today for EMB and next steps in plan of care  Of note, her sister recently died unexpectedly in a house fire and her daughter was attacked by their family dog.   Past Medical History:  Diagnosis Date   Anxiety    Anxiety with depression 08/03/2019   Bilateral carpal tunnel syndrome 02/23/2011   Cervical radiculitis 09/17/2013   CHF (congestive heart failure) (HCC)    Right sided   Chiari malformation type I (HCC) 04/28/2013   Cognitive impairment, mild, so stated 05/23/2014   Degenerative joint disease of cervical and lumbar spine 05/23/2014   Delivery of pregnancy by cesarean section    Depression    Dry cough 08/03/2019   Dysrhythmia    Epidermal inclusion cyst 04/10/2012   Eye pain 04/24/2013   Fever of unknown origin (FUO) 11/05/2014   Fluid retention in legs 06/06/2021   Gastrointestinal symptoms 06/06/2021   Formatting of this note might be different from the original.     Last Assessment & Plan:  Formatting of this note might be different from the original.   Hematuria 12/18/2019   History of C-section 05/12/2014   Formatting of this note might be different from the original. 2006 LTCS for breech & NRFHR   History  of DVT (deep vein thrombosis) 07/05/2020   Hypothyroidism    Lumbar radiculopathy 09/17/2013   May-Thurner syndrome 12/13/2020   MDD (major depressive disorder), recurrent episode, mild (HCC) 04/04/2012   Migraine headache without aura 04/10/2012   Myopia of both eyes 04/24/2013   Neck pain 09/17/2013   Neuropathy, median nerve 09/17/2013   Pain in joint involving left lower leg 07/05/2020   Pain of left lower extremity 07/05/2020   Scoliosis 12/18/2019   Visual disturbance 04/24/2013   Past Surgical History:  Procedure Laterality Date   CESAREAN SECTION     x2   IR ANGIOGRAM PELVIS SELECTIVE OR SUPRASELECTIVE  01/29/2023   IR ANGIOGRAM PELVIS SELECTIVE OR SUPRASELECTIVE  01/29/2023   IR ANGIOGRAM SELECTIVE EACH ADDITIONAL VESSEL  01/29/2023   IR EMBO TUMOR ORGAN ISCHEMIA INFARCT INC GUIDE ROADMAPPING  01/29/2023   IR FLUORO GUIDED NEEDLE PLC ASPIRATION/INJECTION LOC  01/29/2023   IR RADIOLOGIST EVAL & MGMT  08/23/2020   IR RADIOLOGIST EVAL & MGMT  05/30/2021   IR RADIOLOGIST EVAL & MGMT  10/26/2021   IR RADIOLOGIST EVAL & MGMT  01/03/2023   IR US GUIDE VASC ACCESS RIGHT  01/29/2023   VISCERAL ANGIOGRAPHY N/A 10/16/2021   Procedure: MESENTRIC ANGIOGRAPHY;  Surgeon: Maeola Harman, MD;  Location: [facility] INVASIVE CV LAB;  Service: Cardiovascular;  Laterality: N/A;   Current Outpatient Medications on File Prior to Visit  Medication Sig Dispense Refill   acetaminophen (TYLENOL) 500  MG tablet Take 1,000 mg by mouth every 6 (six) hours as needed for mild pain (pain score 1-3) or moderate pain (pain score 4-6).     ALPRAZolam (XANAX) 0.25 MG tablet Take 0.25 mg by mouth 3 (three) times daily as needed for anxiety.     B-D 3CC LUER-LOK SYR 25GX1/2" 25G X 1-1/2" 3 ML MISC Inject 1 each into the muscle once a week.     BOTOX 200 units injection Inject 200 Units into the muscle every 3 (three) months.     buPROPion HCl ER, XL, 450 MG TB24 Take 450 mg by mouth daily.     cyanocobalamin  (VITAMIN B12) 1000 MCG/ML injection Inject 1,000 mcg into the muscle every Wednesday.     diltiazem (TIADYLT ER) 120 MG 24 hr capsule Take 240 mg by mouth daily.     etonogestrel (NEXPLANON) 68 MG IMPL implant Inject 1 each into the skin once.     folic acid (FOLVITE) 1 MG tablet Take 1 tablet (1 mg total) by mouth daily. 30 tablet 6   hydroxychloroquine (PLAQUENIL) 200 MG tablet Take 400 mg by mouth daily.     levothyroxine (SYNTHROID) 75 MCG tablet Take one tablet by mouth daily on Mondays, Tuesdays, Wednesdays, Thursdays and Fridays.  Take one and one-half tablet on Saturdays and Sundays. NEEDS APPOINTMENT FOR FURTHER REFILLS. 102 tablet 0   linaclotide (LINZESS) 290 MCG CAPS capsule Take 290 mcg by mouth daily.     lurasidone (LATUDA) 40 MG TABS tablet Take 40 mg by mouth every evening.     meclizine (ANTIVERT) 25 MG tablet Take 25 mg by mouth 3 (three) times daily as needed.     megestrol (MEGACE) 40 MG tablet Take 1 tablet (40 mg total) by mouth 2 (two) times daily. Can increase to two tablets twice a day in the event of heavy bleeding 60 tablet 5   Multiple Vitamin (MULTIVITAMIN ADULT PO) Take 1 tablet by mouth in the morning.     norethindrone (AYGESTIN) 5 MG tablet Take 3 tablets (15 mg total) by mouth daily. (Patient not taking: Reported on 03/14/2023) 30 tablet 2   NURTEC 75 MG TBDP Take 75 mg by mouth daily as needed for migraine.     ondansetron (ZOFRAN-ODT) 8 MG disintegrating tablet TAKE 1 TABLET BY MOUTH EVERY 8 HOURS AS NEEDED FOR NAUSEA 60 tablet 0   promethazine (PHENERGAN) 12.5 MG tablet Take 12.5 mg by mouth every 4 (four) hours as needed.     QULIPTA 60 MG TABS Take 60 mg by mouth daily.     rivaroxaban (XARELTO) 20 MG TABS tablet Take 1 tablet (20 mg total) by mouth daily with supper. 30 tablet 11   spironolactone (ALDACTONE) 25 MG tablet Take 25 mg by mouth daily.     TIADYLT ER 120 MG 24 hr capsule Take 240 mg by mouth daily.     Vilazodone HCl (VIIBRYD) 40 MG TABS Take 1  tablet (40 mg total) by mouth daily. 90 tablet 1   Vitamin D, Ergocalciferol, (DRISDOL) 1.25 MG (50000 UNIT) CAPS capsule Take 50,000 Units by mouth every Thursday.     zolpidem (AMBIEN) 10 MG tablet Take 10 mg by mouth at bedtime.     No current facility-administered medications on file prior to visit.   Allergies  Allergen Reactions   Penicillins Itching and Rash    Patient feels itching in mouth.   OB History     Gravida  2   Para  2  Term  2   Preterm      AB      Living  2      SAB      IAB      Ectopic      Multiple      Live Births             Social History   Socioeconomic History   Marital status: Married    Spouse name: Not on file   Number of children: 2   Years of education: Not on file   Highest education level: Not on file  Occupational History   Not on file  Tobacco Use   Smoking status: Never   Smokeless tobacco: Never  Vaping Use   Vaping status: Never Used  Substance and Sexual Activity   Alcohol use: Not Currently   Drug use: Never   Sexual activity: Yes    Partners: Male    Birth control/protection: Implant  Other Topics Concern   Not on file  Social History Narrative   Not on file   Social Drivers of Health   Financial Resource Strain: Patient Declined (08/12/2022)   Received from [facility]   Overall Financial Resource Strain (CARDIA)    Difficulty of Paying Living Expenses: Patient declined  Food Insecurity: Patient Declined (08/12/2022)   Received from [facility]   Hunger Vital Sign    Worried About Running Out of Food in the Last Year: Patient declined    Ran Out of Food in the Last Year: Patient declined  Transportation Needs: Patient Declined (08/12/2022)   Received from [facility] - Transportation    Lack of Transportation (Medical): Patient declined    Lack of Transportation (Non-Medical): Patient declined  Physical Activity: Unknown (08/12/2022)   Received from [facility]   Exercise Vital  Sign    Days of Exercise per Week: Patient declined    Minutes of Exercise per Session: Not on file  Stress: Patient Declined (08/12/2022)   Received from [facility] - Occupational Stress Questionnaire    Feeling of Stress : Patient declined  Social Connections: Unknown (05/08/2021)   Received from [facility], [facility]   Social Network    Social Network: Not on file  Intimate Partner Violence: Not At Risk (08/12/2022)   Received from [facility]   HITS    Over the last 12 months how often did your partner physically hurt you?: Never    Over the last 12 months how often did your partner insult you or talk down to you?: Never    Over the last 12 months how often did your partner threaten you with physical harm?: Never    Over the last 12 months how often did your partner scream or curse at you?: Never   Objective:   Vitals:   03/13/23 1533  BP: 107/73  Pulse: 91  Weight: 171 lb (77.6 kg)  Height: 5\' 2"  (1.575 m)   General:  Alert, oriented and cooperative. Patient is in no acute distress.  Skin: Skin is warm and dry. No rash noted.   Cardiovascular: Normal heart rate noted  Respiratory: Normal respiratory effort, no problems with respiration noted   Assessment and Plan:  Yesenia Harper is a 38 y.o. with refractory AUB  Refractory AUB - Likely due to combination of adenomyosis, Nexplanon and anticoagulation - EMB performed today without complication - Discussed removing Nexplanon and using alternative contraception that  isn't associated with irregularly irregular bleeding. She declines.  - We discussed that I have OR availability to do an endometrial ablation on Friday. Reviewed that this may be a temporizing procedure with higher failure rates in adenomyosis & women age < 64, but this should still significantly slow her bleeding down. Compared to hysterectomy it has lower surgical risk, shorter recovery time, and shorter  procedure length allowing it to be scheduled ASAP - Risks of endometrial ablation were reviewed including pain, infection, bleeding, injury to surrounding organs, thermal injury, uterine perforation, inability to complete the procedure, or unsatisfactory results on her symptoms. Reviewed anticipate recovery and post op restrictions. - OR request sent - Continue megace 40mg  BID: -     Surgical pathology( [facility]/ [facility]) -     Ambulatory Referral For Surgery Scheduling -     POCT urine pregnancy  Lennart Pall, MD

## 2023-03-14 ENCOUNTER — Inpatient Hospital Stay

## 2023-03-14 ENCOUNTER — Encounter (HOSPITAL_COMMUNITY): Payer: Self-pay | Admitting: Obstetrics and Gynecology

## 2023-03-14 ENCOUNTER — Encounter: Payer: Self-pay | Admitting: *Deleted

## 2023-03-14 DIAGNOSIS — N920 Excessive and frequent menstruation with regular cycle: Secondary | ICD-10-CM | POA: Diagnosis not present

## 2023-03-14 DIAGNOSIS — D500 Iron deficiency anemia secondary to blood loss (chronic): Secondary | ICD-10-CM

## 2023-03-14 DIAGNOSIS — D649 Anemia, unspecified: Secondary | ICD-10-CM

## 2023-03-14 DIAGNOSIS — N939 Abnormal uterine and vaginal bleeding, unspecified: Secondary | ICD-10-CM

## 2023-03-14 LAB — CBC WITH DIFFERENTIAL (CANCER CENTER ONLY)
Abs Immature Granulocytes: 0.02 10*3/uL (ref 0.00–0.07)
Basophils Absolute: 0 10*3/uL (ref 0.0–0.1)
Basophils Relative: 0 %
Eosinophils Absolute: 0.2 10*3/uL (ref 0.0–0.5)
Eosinophils Relative: 2 %
HCT: 39.1 % (ref 36.0–46.0)
Hemoglobin: 12.7 g/dL (ref 12.0–15.0)
Immature Granulocytes: 0 %
Lymphocytes Relative: 25 %
Lymphs Abs: 2.1 10*3/uL (ref 0.7–4.0)
MCH: 30.5 pg (ref 26.0–34.0)
MCHC: 32.5 g/dL (ref 30.0–36.0)
MCV: 93.8 fL (ref 80.0–100.0)
Monocytes Absolute: 0.7 10*3/uL (ref 0.1–1.0)
Monocytes Relative: 8 %
Neutro Abs: 5.4 10*3/uL (ref 1.7–7.7)
Neutrophils Relative %: 65 %
Platelet Count: 461 10*3/uL — ABNORMAL HIGH (ref 150–400)
RBC: 4.17 MIL/uL (ref 3.87–5.11)
RDW: 15.6 % — ABNORMAL HIGH (ref 11.5–15.5)
WBC Count: 8.3 10*3/uL (ref 4.0–10.5)
nRBC: 0 % (ref 0.0–0.2)

## 2023-03-14 NOTE — Progress Notes (Addendum)
 Spoke w/ via phone for pre-op interview--- Yesenia Harper Lab needs dos----  BMP, UPT and EKG per anesthesia. Surgeon orders requested 03/14/23.       Lab results------ECHO from 2022 in Epic. EF 60-65% COVID test -----patient states asymptomatic no test needed Arrive at -------1150 NPO after MN NO Solid Food.  Clear liquids from MN until---1050 Pre-Surgery Ensure or G2:  Med rec completed Medications to take morning of surgery -----Bupropion, Diltiazem, Levothyroxine and Cytomel. Xanax PRN  Diabetic medication -----  GLP1 agonist last dose: GLP1 instructions:  Patient instructed no nail polish to be worn day of surgery Patient instructed to bring photo id and insurance card day of surgery Patient aware to have Driver (ride ) / caregiver    for 24 hours after surgery - Husband Yesenia Harper Patient Special Instructions ----- Shower with antibacterial soap. Pre-Op special Instructions -----  Patient verbalized understanding of instructions that were given at this phone interview. Patient denies chest pain, sob, fever, cough at the interview.   Patient has been holding Xarelto per MD instructions since 03/05/23 for heavy menses.

## 2023-03-15 ENCOUNTER — Encounter (HOSPITAL_COMMUNITY): Payer: Self-pay | Admitting: Certified Registered Nurse Anesthetist

## 2023-03-15 ENCOUNTER — Encounter: Payer: Self-pay | Admitting: Obstetrics and Gynecology

## 2023-03-15 ENCOUNTER — Ambulatory Visit (HOSPITAL_COMMUNITY): Admission: RE | Admit: 2023-03-15 | Source: Home / Self Care | Admitting: Obstetrics and Gynecology

## 2023-03-15 DIAGNOSIS — N939 Abnormal uterine and vaginal bleeding, unspecified: Secondary | ICD-10-CM

## 2023-03-15 HISTORY — DX: Heart failure, unspecified: I50.9

## 2023-03-15 HISTORY — DX: Cardiac arrhythmia, unspecified: I49.9

## 2023-03-15 LAB — SURGICAL PATHOLOGY

## 2023-03-15 SURGERY — ABLATION, ENDOMETRIUM, HYSTEROSCOPIC
Anesthesia: Choice

## 2023-03-17 NOTE — Progress Notes (Signed)
      GYNECOLOGY OFFICE PROCEDURE NOTE   Yesenia Harper is a 38 y.o. [id number] here for endometrial biopsy for AUB.    ENDOMETRIAL BIOPSY     The indications for endometrial biopsy were reviewed.   Risks of the biopsy including cramping, bleeding, infection, uterine perforation, inadequate specimen and need for additional procedures were discussed. Offered alternative of hysteroscopy, dilation and curettage in OR. The patient states she understands the R/B/I/A and agrees to undergo procedure today. Urine pregnancy test was Negative. Consent was signed. Time out was performed.    Patient was positioned in dorsal lithotomy position. A vaginal speculum was placed.  The cervix was visualized and was prepped with Betadine.  A single-toothed tenaculum was placed on the anterior lip of the cervix to stabilize it. The 3 mm pipelle was easily introduced into the endometrial cavity without difficulty to a depth of 9 cm, and a Moderate amount of tissue was obtained after three passes and sent to pathology. The instruments were removed from the patient's vagina. Minimal bleeding from the cervix was noted. The patient tolerated the procedure well.   Patient was given post procedure instructions.  Will follow up pathology and manage accordingly; patient will be contacted with results and recommendations.  Routine preventative health maintenance measures emphasized.   Harvie Bridge, MD Obstetrician & Gynecologist, [facility] for [employer], [facility]

## 2023-03-18 ENCOUNTER — Telehealth: Payer: Self-pay | Admitting: Obstetrics and Gynecology

## 2023-03-18 NOTE — Telephone Encounter (Signed)
 Called patient to discuss next steps as her ablation was cancelled on Friday due to childcare issues.   She is taking megace BID and started bleeding again but within reason. Changes pad or tampon every 3 hours, no heavy bleeding like before.   I do not have OR time/availability until April 1 - explained I'm either in the office, on night call, or teaching post call. In light of this, I will work with my partners to see who could potentially take patient to OR sooner.   Discussed options again including removing nexplanon (can do in office this week), endometrial ablation or hysterectomy. She would still prefer ablation understanding that she may ultimately need a hysterectomy. She declines nexplanon removal since she uses it for contraception.   Message routed to surgery scheduling team to see what OR availability is like this week. Pt aware that we are working behind the scenes to get her scheduled.   Harvie Bridge, MD Obstetrician & Gynecologist, [facility] for [employer], [facility]

## 2023-03-19 ENCOUNTER — Other Ambulatory Visit: Payer: Self-pay | Admitting: Family

## 2023-03-19 DIAGNOSIS — D500 Iron deficiency anemia secondary to blood loss (chronic): Secondary | ICD-10-CM

## 2023-03-19 DIAGNOSIS — D649 Anemia, unspecified: Secondary | ICD-10-CM

## 2023-03-19 NOTE — Progress Notes (Signed)
 Spoke w/ via phone for pre-op interview--- Yesenia Harper needs dos----  [facility] and UPT per anesthesia       Harper results-----ECHO in Epic from 2022 EF 60-65% COVID test -----patient states asymptomatic no test needed Arrive at -------1040 NPO after MN NO Solid Food.  Clear liquids from MN until---0940 Pre-Surgery Ensure or G2:  Med rec completed Medications to take morning of surgery -----Bupropion, Diltiazem, Levothyroxine, Cytomel and Xanax PRN Diabetic medication -----  GLP1 agonist last dose: GLP1 instructions:  Patient instructed no nail polish to be worn day of surgery Patient instructed to bring photo id and insurance card day of surgery Patient aware to have Driver (ride ) / caregiver    for 24 hours after surgery - Husband Yesenia Harper Patient Special Instructions ----- Shower with antibacterial soap. Pre-Op special Instructions -----  Patient verbalized understanding of instructions that were given at this phone interview. Patient denies chest pain, sob, fever, cough at the interview.

## 2023-03-20 ENCOUNTER — Encounter: Payer: Self-pay | Admitting: Family

## 2023-03-20 ENCOUNTER — Inpatient Hospital Stay (HOSPITAL_BASED_OUTPATIENT_CLINIC_OR_DEPARTMENT_OTHER): Admitting: Family

## 2023-03-20 ENCOUNTER — Inpatient Hospital Stay

## 2023-03-20 VITALS — BP 106/78 | HR 94 | Temp 98.9°F | Resp 17 | Wt 170.0 lb

## 2023-03-20 DIAGNOSIS — D649 Anemia, unspecified: Secondary | ICD-10-CM

## 2023-03-20 DIAGNOSIS — D500 Iron deficiency anemia secondary to blood loss (chronic): Secondary | ICD-10-CM

## 2023-03-20 DIAGNOSIS — N939 Abnormal uterine and vaginal bleeding, unspecified: Secondary | ICD-10-CM

## 2023-03-20 DIAGNOSIS — N920 Excessive and frequent menstruation with regular cycle: Secondary | ICD-10-CM | POA: Diagnosis not present

## 2023-03-20 LAB — CBC WITH DIFFERENTIAL (CANCER CENTER ONLY)
Abs Immature Granulocytes: 0.04 10*3/uL (ref 0.00–0.07)
Basophils Absolute: 0.1 10*3/uL (ref 0.0–0.1)
Basophils Relative: 1 %
Eosinophils Absolute: 0.1 10*3/uL (ref 0.0–0.5)
Eosinophils Relative: 2 %
HCT: 40.6 % (ref 36.0–46.0)
Hemoglobin: 13.3 g/dL (ref 12.0–15.0)
Immature Granulocytes: 1 %
Lymphocytes Relative: 26 %
Lymphs Abs: 2 10*3/uL (ref 0.7–4.0)
MCH: 30.9 pg (ref 26.0–34.0)
MCHC: 32.8 g/dL (ref 30.0–36.0)
MCV: 94.4 fL (ref 80.0–100.0)
Monocytes Absolute: 0.7 10*3/uL (ref 0.1–1.0)
Monocytes Relative: 9 %
Neutro Abs: 4.9 10*3/uL (ref 1.7–7.7)
Neutrophils Relative %: 61 %
Platelet Count: 402 10*3/uL — ABNORMAL HIGH (ref 150–400)
RBC: 4.3 MIL/uL (ref 3.87–5.11)
RDW: 14.3 % (ref 11.5–15.5)
WBC Count: 7.8 10*3/uL (ref 4.0–10.5)
nRBC: 0 % (ref 0.0–0.2)

## 2023-03-20 LAB — CMP (CANCER CENTER ONLY)
ALT: 25 U/L (ref 0–44)
AST: 13 U/L — ABNORMAL LOW (ref 15–41)
Albumin: 4.7 g/dL (ref 3.5–5.0)
Alkaline Phosphatase: 94 U/L (ref 38–126)
Anion gap: 8 (ref 5–15)
BUN: 18 mg/dL (ref 6–20)
CO2: 26 mmol/L (ref 22–32)
Calcium: 9.6 mg/dL (ref 8.9–10.3)
Chloride: 107 mmol/L (ref 98–111)
Creatinine: 0.85 mg/dL (ref 0.44–1.00)
GFR, Estimated: >60 mL/min (ref >60)
Glucose, Bld: 91 mg/dL (ref 70–99)
Potassium: 4.1 mmol/L (ref 3.5–5.1)
Sodium: 141 mmol/L (ref 135–145)
Total Bilirubin: 0.4 mg/dL (ref 0.0–1.2)
Total Protein: 7 g/dL (ref 6.5–8.1)

## 2023-03-20 LAB — RETICULOCYTES
Immature Retic Fract: 4.5 % (ref 2.3–15.9)
RBC.: 4.26 MIL/uL (ref 3.87–5.11)
Retic Count, Absolute: 62.6 10*3/uL (ref 19.0–186.0)
Retic Ct Pct: 1.5 % (ref 0.4–3.1)

## 2023-03-20 LAB — SAMPLE TO BLOOD BANK

## 2023-03-20 LAB — FERRITIN: Ferritin: 217 ng/mL (ref 11–307)

## 2023-03-20 NOTE — Progress Notes (Signed)
 Hematology and Oncology Follow Up Visit  Yesenia Harper [id number] Dec 30, 1985 38 y.o. 03/20/2023   Principle Diagnosis:  May-Thurner Syndrome Recurrent DVT in the left lower extremity Iron deficiency anemia     Current Therapy:        Xarelto 20 mg PO daily- adjusted to 10 mg on 02/20/2023 due to bleeding - stopped due to heavy menses 03/05/2023 Tranfusional support as needed IV iron as indicated B12 IM weekly   Interim History:  Yesenia Harper is here today for follow-up. Her counts are back up and holding steady since her transfusion and IV iron infusions.  She is feeling much better but still notes fatigue.  She is on Megace and cycle has been a little lighter. Clots also seem smaller.  She states that's he will be having an endometrial ablation on Friday 03/22/2023.  No fever, chills, n/v, cough, rash, dizziness, SOB, chest pain, palpitations, abdominal pain or changes in bowel or bladder habits.  No swelling in her extremities.  Neuropathy in her fingers and feet unchanged from baseline.  No falls or syncope.  Appetite and hydration are good. Weight is stable at 170 lbs.   ECOG Performance Status: 1 - Symptomatic but completely ambulatory  Medications:  Allergies as of 03/20/2023       Reactions   Penicillins Itching, Rash   Patient feels itching in mouth.        Medication List        Accurate as of March 20, 2023  3:34 PM. If you have any questions, ask your nurse or doctor.          acetaminophen 500 MG tablet Commonly known as: TYLENOL Take 1,000 mg by mouth every 6 (six) hours as needed for mild pain (pain score 1-3) or moderate pain (pain score 4-6).   ALPRAZolam 0.25 MG tablet Commonly known as: XANAX Take 0.25 mg by mouth 3 (three) times daily as needed for anxiety.   B-D 3CC LUER-LOK SYR 25GX1/2" 25G X 1-1/2" 3 ML Misc Generic drug: SYRINGE-NEEDLE (DISP) 3 ML Inject 1 each into the muscle once a week.   Botox 200 units injection Generic drug:  botulinum toxin Type A Inject 200 Units into the muscle every 3 (three) months.   buPROPion HCl ER (XL) 450 MG Tb24 Take 450 mg by mouth daily.   cyanocobalamin 1000 MCG/ML injection Commonly known as: VITAMIN B12 Inject 1,000 mcg into the muscle every Wednesday.   etonogestrel 68 MG Impl implant Commonly known as: NEXPLANON Inject 1 each into the skin once.   folic acid 1 MG tablet Commonly known as: FOLVITE Take 1 tablet (1 mg total) by mouth daily.   hydroxychloroquine 200 MG tablet Commonly known as: PLAQUENIL Take 400 mg by mouth daily.   levothyroxine 75 MCG tablet Commonly known as: SYNTHROID Take one tablet by mouth daily on Mondays, Tuesdays, Wednesdays, Thursdays and Fridays.  Take one and one-half tablet on Saturdays and Sundays. NEEDS APPOINTMENT FOR FURTHER REFILLS.   linaclotide 290 MCG Caps capsule Commonly known as: LINZESS Take 290 mcg by mouth daily.   lurasidone 40 MG Tabs tablet Commonly known as: LATUDA Take 40 mg by mouth every evening.   meclizine 25 MG tablet Commonly known as: ANTIVERT Take 25 mg by mouth 3 (three) times daily as needed.   megestrol 40 MG tablet Commonly known as: MEGACE Take 1 tablet (40 mg total) by mouth 2 (two) times daily. Can increase to two tablets twice a day in the event of heavy  bleeding   MULTIVITAMIN ADULT PO Take 1 tablet by mouth in the morning.   norethindrone 5 MG tablet Commonly known as: AYGESTIN Take 3 tablets (15 mg total) by mouth daily.   Nurtec 75 MG Tbdp Generic drug: Rimegepant Sulfate Take 75 mg by mouth daily as needed for migraine.   ondansetron 8 MG disintegrating tablet Commonly known as: ZOFRAN-ODT TAKE 1 TABLET BY MOUTH EVERY 8 HOURS AS NEEDED FOR NAUSEA   promethazine 12.5 MG tablet Commonly known as: PHENERGAN Take 12.5 mg by mouth every 4 (four) hours as needed.   Qulipta 60 MG Tabs Generic drug: Atogepant Take 60 mg by mouth daily.   rivaroxaban 20 MG Tabs tablet Commonly  known as: XARELTO Take 1 tablet (20 mg total) by mouth daily with supper.   spironolactone 25 MG tablet Commonly known as: ALDACTONE Take 25 mg by mouth daily.   Tiadylt ER 120 MG 24 hr capsule Generic drug: diltiazem Take 240 mg by mouth daily.   Tiadylt ER 120 MG 24 hr capsule Generic drug: diltiazem Take 240 mg by mouth daily.   Vilazodone HCl 40 MG Tabs Commonly known as: Viibryd Take 1 tablet (40 mg total) by mouth daily.   Vitamin D (Ergocalciferol) 1.25 MG (50000 UNIT) Caps capsule Commonly known as: DRISDOL Take 50,000 Units by mouth every Thursday.   zolpidem 10 MG tablet Commonly known as: AMBIEN Take 10 mg by mouth at bedtime.        Allergies:  Allergies  Allergen Reactions   Penicillins Itching and Rash    Patient feels itching in mouth.    Past Medical History, Surgical history, Social history, and Family History were reviewed and updated.  Review of Systems: All other 10 point review of systems is negative.   Physical Exam:  weight is 170 lb (77.1 kg). Her oral temperature is 98.9 F (37.2 C). Her blood pressure is 106/78 and her pulse is 94. Her respiration is 17 and oxygen saturation is 100%.   Wt Readings from Last 3 Encounters:  03/20/23 170 lb (77.1 kg)  03/13/23 171 lb (77.6 kg)  03/05/23 170 lb (77.1 kg)    Ocular: Sclerae unicteric, pupils equal, round and reactive to light Ear-nose-throat: Oropharynx clear, dentition fair Lymphatic: No cervical or supraclavicular adenopathy Lungs no rales or rhonchi, good excursion bilaterally Heart regular rate and rhythm, no murmur appreciated Abd soft, nontender, positive bowel sounds MSK no focal spinal tenderness, no joint edema Neuro: non-focal, well-oriented, appropriate affect Breasts: Deferred   Lab Results  Component Value Date   WBC 7.8 03/20/2023   HGB 13.3 03/20/2023   HCT 40.6 03/20/2023   MCV 94.4 03/20/2023   PLT 402 (H) 03/20/2023   Lab Results  Component Value Date    FERRITIN 134 03/08/2023   IRON 44 03/08/2023   TIBC 305 03/08/2023   UIBC 261 03/08/2023   IRONPCTSAT 14 03/08/2023   Lab Results  Component Value Date   RETICCTPCT 1.5 03/20/2023   RBC 4.30 03/20/2023   RBC 4.26 03/20/2023   No results found for: "KPAFRELGTCHN", "LAMBDASER", "KAPLAMBRATIO" No results found for: "IGGSERUM", "IGA", "IGMSERUM" No results found for: "TOTALPROTELP", "ALBUMINELP", "A1GS", "A2GS", "BETS", "BETA2SER", "GAMS", "MSPIKE", "SPEI"   Chemistry      Component Value Date/Time   NA 141 03/20/2023 1424   NA 138 04/16/2022 0000   K 4.1 03/20/2023 1424   CL 107 03/20/2023 1424   CO2 26 03/20/2023 1424   BUN 18 03/20/2023 1424   BUN 13 04/16/2022  0000   CREATININE 0.85 03/20/2023 1424   CREATININE 0.78 05/12/2021 0000   GLU 102 04/16/2022 0000      Component Value Date/Time   CALCIUM 9.6 03/20/2023 1424   ALKPHOS 94 03/20/2023 1424   AST 13 (L) 03/20/2023 1424   ALT 25 03/20/2023 1424   BILITOT 0.4 03/20/2023 1424       Impression and Plan: Ms. Hughart is a very pleasant 38 yo caucasian female with history recurrent left lower extremity DVT and [name]. Hyper coag panel was negative. Her most recent DVT was in Dec 2023.  She now has IDA secondary to heavy cycles. She continues to have heavy bleeding and is symptomatic as mentioned above.  Xarelto remains on hold until after heavy cycle is treated with ablation.  Counts are much better and patient symptoms are improved.  Follow-up in 1 month.   Eileen Stanford, NP 3/12/20253:34 PM

## 2023-03-21 ENCOUNTER — Other Ambulatory Visit: Payer: Medicaid Other | Admitting: Obstetrics and Gynecology

## 2023-03-21 LAB — IRON AND IRON BINDING CAPACITY (CC-WL,HP ONLY)
Iron: 96 ug/dL (ref 28–170)
Saturation Ratios: 32 % — ABNORMAL HIGH (ref 10.4–31.8)
TIBC: 300 ug/dL (ref 250–450)
UIBC: 204 ug/dL (ref 148–442)

## 2023-03-21 NOTE — Anesthesia Preprocedure Evaluation (Signed)
 Anesthesia Evaluation  Patient identified by MRN, date of birth, ID band Patient awake    Reviewed: Allergy & Precautions, NPO status , Patient's Chart, lab work & pertinent test results  History of Anesthesia Complications Negative for: history of anesthetic complications  Airway Mallampati: II  TM Distance: >3 FB Neck ROM: Full    Dental  (+) Dental Advisory Given   Pulmonary neg pulmonary ROS   Pulmonary exam normal breath sounds clear to auscultation       Cardiovascular (-) hypertension(-) angina + Peripheral Vascular Disease, +CHF and + DVT (06/2020)  (-) Past MI, (-) Cardiac Stents and (-) CABG + dysrhythmias  Rhythm:Regular Rate:Normal  TTE 03/07/2020: IMPRESSIONS    1. Left ventricular ejection fraction, by estimation, is 60 to 65%. The  left ventricle has normal function. The left ventricle has no regional  wall motion abnormalities. Left ventricular diastolic parameters were  normal.   2. Right ventricular systolic function is normal. The right ventricular  size is normal.   3. The mitral valve is normal in structure. No evidence of mitral valve  regurgitation. No evidence of mitral stenosis.   4. The aortic valve is tricuspid. Aortic valve regurgitation is not  visualized. No aortic stenosis is present.   5. The inferior vena cava is normal in size with greater than 50%  respiratory variability, suggesting right atrial pressure of 3 mmHg.     Neuro/Psych  Headaches, neg Seizures PSYCHIATRIC DISORDERS (PTSD) Anxiety Depression Bipolar Disorder   Chiari malformation type I  Neuromuscular disease (lumbar radiculopathy, cervical radiculitis)    GI/Hepatic negative GI ROS, Neg liver ROS,,,  Endo/Other  neg diabetesHypothyroidism    Renal/GU negative Renal ROS     Musculoskeletal  (+) Arthritis ,  Scoliosis    Abdominal   Peds  Hematology  (+) Blood dyscrasia, anemia Lab Results      Component                 Value               Date                      WBC                      7.8                 03/20/2023                HGB                      13.3                03/20/2023                HCT                      40.6                03/20/2023                MCV                      94.4                03/20/2023                PLT  402 (H)             03/20/2023              Anesthesia Other Findings May-Thurner syndrome  Last Xarelto: on hold since 03/05/2023 due to heavy menses  Reproductive/Obstetrics                             Anesthesia Physical Anesthesia Plan  ASA: 3  Anesthesia Plan: General   Post-op Pain Management: Tylenol PO (pre-op)*   Induction:   PONV Risk Score and Plan: 4 or greater and Ondansetron, Dexamethasone, Midazolam, Scopolamine patch - Pre-op and Treatment may vary due to age or medical condition  Airway Management Planned: LMA  Additional Equipment:   Intra-op Plan:   Post-operative Plan: Extubation in OR  Informed Consent: I have reviewed the patients History and Physical, chart, labs and discussed the procedure including the risks, benefits and alternatives for the proposed anesthesia with the patient or authorized representative who has indicated his/her understanding and acceptance.     Dental advisory given  Plan Discussed with: CRNA and Anesthesiologist  Anesthesia Plan Comments: (Risks of general anesthesia discussed including, but not limited to, sore throat, hoarse voice, chipped/damaged teeth, injury to vocal cords, nausea and vomiting, allergic reactions, lung infection, heart attack, stroke, and death. All questions answered. )       Anesthesia Quick Evaluation

## 2023-03-22 ENCOUNTER — Encounter (HOSPITAL_COMMUNITY)
Admission: RE | Disposition: A | Discharge status: Home / Self Care | Payer: Self-pay | Source: Home / Self Care | Attending: Obstetrics and Gynecology

## 2023-03-22 ENCOUNTER — Other Ambulatory Visit: Payer: Self-pay

## 2023-03-22 ENCOUNTER — Ambulatory Visit (HOSPITAL_COMMUNITY)
Admission: RE | Admit: 2023-03-22 | Discharge: 2023-03-22 | Disposition: A | Discharge status: Home / Self Care | Attending: Obstetrics and Gynecology | Admitting: Obstetrics and Gynecology

## 2023-03-22 ENCOUNTER — Encounter (HOSPITAL_COMMUNITY): Payer: Self-pay | Admitting: Obstetrics and Gynecology

## 2023-03-22 ENCOUNTER — Ambulatory Visit (HOSPITAL_BASED_OUTPATIENT_CLINIC_OR_DEPARTMENT_OTHER): Admitting: Anesthesiology

## 2023-03-22 ENCOUNTER — Ambulatory Visit (HOSPITAL_COMMUNITY): Admitting: Anesthesiology

## 2023-03-22 DIAGNOSIS — M5416 Radiculopathy, lumbar region: Secondary | ICD-10-CM | POA: Insufficient documentation

## 2023-03-22 DIAGNOSIS — Z793 Long term (current) use of hormonal contraceptives: Secondary | ICD-10-CM | POA: Diagnosis not present

## 2023-03-22 DIAGNOSIS — M1990 Unspecified osteoarthritis, unspecified site: Secondary | ICD-10-CM | POA: Diagnosis not present

## 2023-03-22 DIAGNOSIS — M5412 Radiculopathy, cervical region: Secondary | ICD-10-CM | POA: Diagnosis not present

## 2023-03-22 DIAGNOSIS — N939 Abnormal uterine and vaginal bleeding, unspecified: Secondary | ICD-10-CM

## 2023-03-22 DIAGNOSIS — D649 Anemia, unspecified: Secondary | ICD-10-CM | POA: Insufficient documentation

## 2023-03-22 DIAGNOSIS — N858 Other specified noninflammatory disorders of uterus: Secondary | ICD-10-CM | POA: Diagnosis not present

## 2023-03-22 DIAGNOSIS — Z86718 Personal history of other venous thrombosis and embolism: Secondary | ICD-10-CM | POA: Insufficient documentation

## 2023-03-22 DIAGNOSIS — N920 Excessive and frequent menstruation with regular cycle: Secondary | ICD-10-CM | POA: Diagnosis not present

## 2023-03-22 DIAGNOSIS — I509 Heart failure, unspecified: Secondary | ICD-10-CM | POA: Diagnosis not present

## 2023-03-22 DIAGNOSIS — I739 Peripheral vascular disease, unspecified: Secondary | ICD-10-CM | POA: Diagnosis not present

## 2023-03-22 DIAGNOSIS — Z01818 Encounter for other preprocedural examination: Secondary | ICD-10-CM

## 2023-03-22 DIAGNOSIS — E039 Hypothyroidism, unspecified: Secondary | ICD-10-CM | POA: Diagnosis not present

## 2023-03-22 DIAGNOSIS — F419 Anxiety disorder, unspecified: Secondary | ICD-10-CM | POA: Insufficient documentation

## 2023-03-22 DIAGNOSIS — Z7901 Long term (current) use of anticoagulants: Secondary | ICD-10-CM | POA: Diagnosis not present

## 2023-03-22 DIAGNOSIS — R519 Headache, unspecified: Secondary | ICD-10-CM | POA: Insufficient documentation

## 2023-03-22 DIAGNOSIS — F319 Bipolar disorder, unspecified: Secondary | ICD-10-CM | POA: Insufficient documentation

## 2023-03-22 DIAGNOSIS — G935 Compression of brain: Secondary | ICD-10-CM | POA: Insufficient documentation

## 2023-03-22 DIAGNOSIS — M419 Scoliosis, unspecified: Secondary | ICD-10-CM | POA: Insufficient documentation

## 2023-03-22 DIAGNOSIS — N950 Postmenopausal bleeding: Secondary | ICD-10-CM | POA: Diagnosis not present

## 2023-03-22 DIAGNOSIS — N938 Other specified abnormal uterine and vaginal bleeding: Secondary | ICD-10-CM | POA: Diagnosis present

## 2023-03-22 HISTORY — PX: HYSTEROSCOPY WITH D & C: SHX1775

## 2023-03-22 LAB — POCT PREGNANCY, URINE: Preg Test, Ur: NEGATIVE

## 2023-03-22 SURGERY — DILATATION AND CURETTAGE /HYSTEROSCOPY
Anesthesia: General

## 2023-03-22 MED ORDER — SCOPOLAMINE 1 MG/3DAYS TD PT72
MEDICATED_PATCH | TRANSDERMAL | Status: AC
  Filled 2023-03-22: qty 1

## 2023-03-22 MED ORDER — OXYCODONE HCL 5 MG PO TABS: 5.0000 mg | ORAL_TABLET | Freq: Once | ORAL | Status: DC | PRN

## 2023-03-22 MED ORDER — DEXAMETHASONE SODIUM PHOSPHATE 10 MG/ML IJ SOLN
INTRAMUSCULAR | Status: DC | PRN
  Administered 2023-03-22: 10 mg via INTRAVENOUS

## 2023-03-22 MED ORDER — ACETAMINOPHEN 500 MG PO TABS
1000.0000 mg | ORAL_TABLET | Freq: Once | ORAL | Status: AC
  Administered 2023-03-22: 1000 mg via ORAL

## 2023-03-22 MED ORDER — ORAL CARE MOUTH RINSE: 15.0000 mL | Freq: Once | OROMUCOSAL | Status: AC

## 2023-03-22 MED ORDER — LIDOCAINE-EPINEPHRINE 1 %-1:100000 IJ SOLN
INTRAMUSCULAR | Status: AC
Start: 2023-03-22 — End: ?
  Filled 2023-03-22: qty 1

## 2023-03-22 MED ORDER — FENTANYL CITRATE (PF) 250 MCG/5ML IJ SOLN
INTRAMUSCULAR | Status: DC | PRN
  Administered 2023-03-22: 50 ug via INTRAVENOUS

## 2023-03-22 MED ORDER — SCOPOLAMINE 1 MG/3DAYS TD PT72
1.0000 | MEDICATED_PATCH | TRANSDERMAL | Status: DC
  Administered 2023-03-22: 1.5 mg via TRANSDERMAL

## 2023-03-22 MED ORDER — LIDOCAINE 2% (20 MG/ML) 5 ML SYRINGE
INTRAMUSCULAR | Status: DC | PRN
  Administered 2023-03-22: 100 mg via INTRAVENOUS

## 2023-03-22 MED ORDER — MIDAZOLAM HCL 2 MG/2ML IJ SOLN
INTRAMUSCULAR | Status: DC | PRN
  Administered 2023-03-22: 2 mg via INTRAVENOUS

## 2023-03-22 MED ORDER — FENTANYL CITRATE (PF) 100 MCG/2ML IJ SOLN
INTRAMUSCULAR | Status: AC
  Filled 2023-03-22: qty 2

## 2023-03-22 MED ORDER — SODIUM CHLORIDE 0.9 % IR SOLN
Status: DC | PRN
  Administered 2023-03-22: 3000 mL

## 2023-03-22 MED ORDER — OXYCODONE HCL 5 MG/5ML PO SOLN: 5.0000 mg | Freq: Once | ORAL | Status: DC | PRN

## 2023-03-22 MED ORDER — AMISULPRIDE (ANTIEMETIC) 5 MG/2ML IV SOLN: 10.0000 mg | Freq: Once | INTRAVENOUS | Status: DC | PRN

## 2023-03-22 MED ORDER — CHLORHEXIDINE GLUCONATE 0.12 % MT SOLN
OROMUCOSAL | Status: AC
  Filled 2023-03-22: qty 15

## 2023-03-22 MED ORDER — FENTANYL CITRATE (PF) 100 MCG/2ML IJ SOLN
25.0000 ug | INTRAMUSCULAR | Status: DC | PRN
  Administered 2023-03-22: 25 ug via INTRAVENOUS

## 2023-03-22 MED ORDER — CHLORHEXIDINE GLUCONATE 0.12 % MT SOLN
15.0000 mL | Freq: Once | OROMUCOSAL | Status: AC
  Administered 2023-03-22: 15 mL via OROMUCOSAL

## 2023-03-22 MED ORDER — SILVER NITRATE-POT NITRATE 75-25 % EX MISC
CUTANEOUS | Status: AC
  Filled 2023-03-22: qty 10

## 2023-03-22 MED ORDER — PROPOFOL 10 MG/ML IV BOLUS
INTRAVENOUS | Status: DC | PRN
  Administered 2023-03-22: 200 mg via INTRAVENOUS

## 2023-03-22 MED ORDER — ONDANSETRON HCL 4 MG/2ML IJ SOLN
INTRAMUSCULAR | Status: DC | PRN
Start: 2023-03-22 — End: 2023-03-22
  Administered 2023-03-22: 4 mg via INTRAVENOUS

## 2023-03-22 MED ORDER — PHENYLEPHRINE 80 MCG/ML (10ML) SYRINGE FOR IV PUSH (FOR BLOOD PRESSURE SUPPORT)
PREFILLED_SYRINGE | INTRAVENOUS | Status: DC | PRN
  Administered 2023-03-22 (×2): 80 ug via INTRAVENOUS

## 2023-03-22 MED ORDER — ACETAMINOPHEN 500 MG PO TABS
ORAL_TABLET | ORAL | Status: AC
  Filled 2023-03-22: qty 2

## 2023-03-22 MED ORDER — LIDOCAINE-EPINEPHRINE 1 %-1:100000 IJ SOLN
INTRAMUSCULAR | Status: DC | PRN
  Administered 2023-03-22: 20 mL

## 2023-03-22 MED ORDER — PHENYLEPHRINE HCL-NACL 20-0.9 MG/250ML-% IV SOLN: INTRAVENOUS | Status: DC | PRN

## 2023-03-22 MED ORDER — LACTATED RINGERS IV SOLN
INTRAVENOUS | Status: DC
Start: 2023-03-22 — End: 2023-03-22

## 2023-03-22 SURGICAL SUPPLY — 17 items
ABLATOR SURESOUND NOVASURE (ABLATOR) ×1 IMPLANT
CATH ROBINSON RED A/P 16FR (CATHETERS) IMPLANT
DILATOR CANAL MILEX (MISCELLANEOUS) IMPLANT
ELECT REM PT RETURN 9FT ADLT (ELECTROSURGICAL) IMPLANT
ELECTRODE REM PT RTRN 9FT ADLT (ELECTROSURGICAL) IMPLANT
GLOVE BIO SURGEON STRL SZ 6 (GLOVE) ×2 IMPLANT
GOWN STRL REUS W/ TWL LRG LVL3 (GOWN DISPOSABLE) ×4 IMPLANT
KIT HYDRO THERMABLATOR (MISCELLANEOUS) IMPLANT
KIT PROCEDURE FLUENT (KITS) ×2 IMPLANT
NDL SPNL 22GX3.5 QUINCKE BK (NEEDLE) IMPLANT
NEEDLE SPNL 22GX3.5 QUINCKE BK (NEEDLE) ×2 IMPLANT
PACK VAGINAL MINOR WOMEN LF (CUSTOM PROCEDURE TRAY) ×2 IMPLANT
PAD OB MATERNITY 4.3X12.25 (PERSONAL CARE ITEMS) ×2 IMPLANT
SEAL ROD LENS SCOPE MYOSURE (ABLATOR) ×1 IMPLANT
SET GENESYS HTA PROCERVA (MISCELLANEOUS) ×1 IMPLANT
SLEEVE SCD COMPRESS KNEE MED (STOCKING) ×2 IMPLANT
TOWEL OR 17X24 6PK STRL BLUE (TOWEL DISPOSABLE) ×4 IMPLANT

## 2023-03-22 NOTE — Discharge Instructions (Signed)

## 2023-03-22 NOTE — Progress Notes (Signed)
 Left message for patient to come in at 0900 to stop liquids now. Call back at [phone] to verify the time change

## 2023-03-22 NOTE — Anesthesia Procedure Notes (Signed)
 Procedure Name: LMA Insertion Date/Time: 03/22/2023 11:07 AM  Performed by: Babak Lucus C, CRNAPre-anesthesia Checklist: Patient identified, Emergency Drugs available, Suction available and Patient being monitored Patient Re-evaluated:Patient Re-evaluated prior to induction Oxygen Delivery Method: Circle System Utilized Preoxygenation: Pre-oxygenation with 100% oxygen Induction Type: IV induction Ventilation: Mask ventilation without difficulty LMA: LMA inserted LMA Size: 4.0 Number of attempts: 1 Airway Equipment and Method: Bite block Placement Confirmation: positive ETCO2 Tube secured with: Tape Dental Injury: Teeth and Oropharynx as per pre-operative assessment

## 2023-03-22 NOTE — Interval H&P Note (Signed)
 History and Physical Interval Note:  03/22/2023 10:16 AM  Yesenia Harper  has presented today for surgery, with the diagnosis of abnormal uterine bleeding.  The various methods of treatment have been discussed with the patient and family. After consideration of risks, benefits and other options for treatment, the patient has consented to  Procedure(s) with comments: ABLATION, ENDOMETRIUM, HYSTEROSCOPIC (N/A) - Novasure as a surgical intervention.  The patient's history has been reviewed, patient examined, no change in status, stable for surgery.  I have reviewed the patient's chart and labs.  Questions were answered to the patient's satisfaction.     Milas Hock

## 2023-03-22 NOTE — Op Note (Signed)
 Yesenia Harper PROCEDURE DATE: 03/22/2023   PREOPERATIVE DIAGNOSIS:  Menorrhagia on anticoagulation  POSTOPERATIVE DIAGNOSIS:  Same  PROCEDURE:  D&C, hysteroscopy, attempted Novasure and HTA endometrial ablation  SURGEON:  Dr. Milas Hock  ASSISTANT:  None  ANESTHESIA:  LMA, Paracervical block  COMPLICATIONS:  None immediate.  ESTIMATED BLOOD LOSS:  10 ml.  FLUIDS: 500 ml LR.  URINE OUTPUT:  None - not drained  INDICATIONS: 38 y.o. [id number] with heavy menstrual bleeding on anticoagulation    FINDINGS:  NEFG, normal appearing cervix. Normal proliferative endometrial cavity without defects. Ostia visualized bilaterally. Cavity was overall narrow likely leading to inability to perform Novasure. Suspect fluid egress from the tubes led to HTA failure.   TECHNIQUE:  The patient was taken to the operating room where LMA anesthesia was obtained without difficulty.  She was then placed in the dorsal lithotomy position and prepared and draped in sterile fashion.  After an adequate timeout was performed, a bivalved speculum was then placed in the patient's vagina, and the anterior lip of cervix grasped with the single-tooth tenaculum.  Paracervical block was performed with lidocaine with epinephrine for a total of 20 cc. The cervix was dilated with Shawnie Pons dilators. Hysteroscope inserted and cavity had aforementioned findings. Uterine sound was 10 cm. Cervical length was 5 cm giving cavity length of 5. Novasure device inserted and cavity width found to be 2.2. Despite multiple attempts and trouble shooting, unable to get Novasure to open beyond 2.2. I then converted to an HTA ablation. During cavity test, it noted fluid leakage and confirm no uterine perforation. No fluid was leaking out the cervix. Hysteroscope inserted and no evidence of perforation. HTA attempted again, but again fluid loss consistent with likely egress from the tubes. No additional measures for ablation possible, so procedure  terminated.   All instruments removed and procedure was ended.   The patient will be discharged to home as per PACU criteria.  Routine postoperative instructions given.  Milas Hock, MD Attending Obstetrician & Gynecologist, [facility] for [facility], [facility]

## 2023-03-22 NOTE — Anesthesia Postprocedure Evaluation (Signed)
 Anesthesia Post Note  Patient: Yesenia Harper  Procedure(s) Performed: DILATATION AND CURETTAGE /HYSTEROSCOPY WITH ATTEMPTED ENDOMETRIAL ABLATION     Patient location during evaluation: PACU Anesthesia Type: General Level of consciousness: awake Pain management: pain level controlled Vital Signs Assessment: post-procedure vital signs reviewed and stable Respiratory status: spontaneous breathing, nonlabored ventilation and respiratory function stable Cardiovascular status: blood pressure returned to baseline and stable Postop Assessment: no apparent nausea or vomiting Anesthetic complications: no   No notable events documented.  Last Vitals:  Vitals:   03/22/23 1246 03/22/23 1300  BP: 93/64 (!) 98/55  Pulse: 94 91  Resp: 12 14  Temp:  36.7 C  SpO2: 100% 99%    Last Pain:  Vitals:   03/22/23 1245  TempSrc:   PainSc: 2                  Linton Rump

## 2023-03-22 NOTE — Transfer of Care (Signed)
 Immediate Anesthesia Transfer of Care Note  Patient: Yesenia Harper  Procedure(s) Performed: DILATATION AND CURETTAGE /HYSTEROSCOPY WITH ATTEMPTED ENDOMETRIAL ABLATION  Patient Location: PACU  Anesthesia Type:General  Level of Consciousness: awake, alert , and oriented  Airway & Oxygen Therapy: Patient Spontanous Breathing and Patient connected to nasal cannula oxygen  Post-op Assessment: Report given to RN and Post -op Vital signs reviewed and stable  Post vital signs: Reviewed and stable  Last Vitals:  Vitals Value Taken Time  BP 99/48 03/22/23 1208  Temp 36.2 C 03/22/23 1200  Pulse 93 03/22/23 1212  Resp 14 03/22/23 1213  SpO2 100 % 03/22/23 1212  Vitals shown include unfiled device data.  Last Pain:  Vitals:   03/22/23 1200  TempSrc:   PainSc: 5       Patients Stated Pain Goal: 4 (03/22/23 1200)  Complications: No notable events documented.

## 2023-03-24 ENCOUNTER — Encounter: Payer: Self-pay | Admitting: Family

## 2023-03-24 ENCOUNTER — Other Ambulatory Visit: Payer: Self-pay | Admitting: Vascular Surgery

## 2023-03-25 ENCOUNTER — Encounter: Payer: Self-pay | Admitting: Obstetrics and Gynecology

## 2023-03-25 ENCOUNTER — Encounter (HOSPITAL_COMMUNITY): Payer: Self-pay | Admitting: Obstetrics and Gynecology

## 2023-03-25 LAB — SURGICAL PATHOLOGY

## 2023-03-27 ENCOUNTER — Encounter: Payer: Self-pay | Admitting: *Deleted

## 2023-03-27 NOTE — Progress Notes (Signed)
 Hysterectomy consent form signed and routed to [employer] and [employer].

## 2023-03-29 DIAGNOSIS — M5417 Radiculopathy, lumbosacral region: Secondary | ICD-10-CM | POA: Diagnosis not present

## 2023-03-29 DIAGNOSIS — G43109 Migraine with aura, not intractable, without status migrainosus: Secondary | ICD-10-CM | POA: Diagnosis not present

## 2023-03-29 DIAGNOSIS — F411 Generalized anxiety disorder: Secondary | ICD-10-CM | POA: Diagnosis not present

## 2023-03-29 DIAGNOSIS — M542 Cervicalgia: Secondary | ICD-10-CM | POA: Diagnosis not present

## 2023-03-29 DIAGNOSIS — M5412 Radiculopathy, cervical region: Secondary | ICD-10-CM | POA: Diagnosis not present

## 2023-03-29 DIAGNOSIS — G4700 Insomnia, unspecified: Secondary | ICD-10-CM | POA: Diagnosis not present

## 2023-03-29 DIAGNOSIS — M545 Low back pain, unspecified: Secondary | ICD-10-CM | POA: Diagnosis not present

## 2023-03-29 DIAGNOSIS — G5603 Carpal tunnel syndrome, bilateral upper limbs: Secondary | ICD-10-CM | POA: Diagnosis not present

## 2023-04-02 ENCOUNTER — Encounter (HOSPITAL_COMMUNITY): Payer: Self-pay | Admitting: Obstetrics and Gynecology

## 2023-04-03 ENCOUNTER — Encounter (HOSPITAL_COMMUNITY): Payer: Self-pay | Admitting: Obstetrics and Gynecology

## 2023-04-03 DIAGNOSIS — I50813 Acute on chronic right heart failure: Secondary | ICD-10-CM | POA: Diagnosis not present

## 2023-04-03 NOTE — Pre-Procedure Instructions (Signed)
 Surgical Instructions   Your procedure is scheduled on :  Tuesday,  04-09-2023. Report to [facility] Main Entrance "A" at 0530 A.M., then check in with the Admitting office. Any questions or running late day of surgery: call [phone]  Questions prior to your surgery date: call [phone] [phone], Monday-Friday, 8am-4pm. If you experience any cold or flu symptoms such as cough, fever, chills, shortness of breath, etc. between now and your scheduled surgery, please notify your surgeon office.    Remember:  Do not eat any food after midnight the night before your surgery,  You may have clear liquids from midnight night before surgery until 0430 AM.   You may drink clear liquids until 4:30 AM the morning of your surgery.   Clear liquids allowed are:  Water Clear Tea (may sweeten, no milk, honey, etc.) Black Coffee Only (may sweeten, NO MILK, CREAM OR POWDERED CREAMER of any kind) Sports drink like Gatorade. NO clear liquids after 4:30 AM day of surgery.  This includes no water,  candy,  gum,  and  mints.    Take these medicines the morning of surgery with A SIP OF WATER : Bupropion Vilazodone (viibryd) Hydroxchloroquine (plaquenil) Diltiazem (tiadylt) Qulipta Linaclotide (linzess) Levothyroxine (synthroid) Omeprazole (prilosec   May take these medicines IF NEEDED: Nurtec Ondansetran (zofran) Meclizine (antivert) Alprazolam (xanax)    One week prior to surgery, STOP taking any Aspirin (unless otherwise instructed by your surgeon) Aleve, Naproxen, Ibuprofen, Motrin, Advil, Goody's, BC's, all herbal medications, fish oil, and non-prescription vitamins.                     Do NOT Smoke (Tobacco/Vaping) for 24 hours prior to your procedure.  If you use a CPAP at night, you may bring your mask/headgear for your overnight stay.   You will be asked to remove any contacts, glasses, piercing's, hearing aid's, dentures/partials prior to surgery. Please bring cases for  these items if needed.    Patients discharged the day of surgery will not be allowed to drive home, and someone needs to stay with them for 24 hours.  SURGICAL WAITING ROOM VISITATION Patients may have no more than 2 support people in the waiting area - these visitors may rotate.   Pre-op nurse will coordinate an appropriate time for 1 ADULT support person, who may not rotate, to accompany patient in pre-op.  Children under the age of 23 must have an adult with them who is not the patient and must remain in the main waiting area with an adult.  If the patient needs to stay at the hospital during part of their recovery, the visitor guidelines for inpatient rooms apply.  Please refer to the [facility] website for the visitor guidelines for any additional information.   If you received a COVID test during your pre-op visit  it is requested that you wear a mask when out in public, stay away from anyone that may not be feeling well and notify your surgeon if you develop symptoms. If you have been in contact with anyone that has tested positive in the last 10 days please notify you surgeon.      Pre-operative CHG Bathing Instructions   You can play a key role in reducing the risk of infection after surgery. Your skin needs to be as free of germs as possible. You can reduce the number of germs on your skin by washing with CHG (chlorhexidine gluconate) soap before surgery. CHG is an antiseptic soap that  kills germs and continues to kill germs even after washing.   DO NOT use if you have an allergy to chlorhexidine/CHG or antibacterial soaps. If your skin becomes reddened or irritated, stop using the CHG and notify Pre-Op nurse day of surgery.  If you have any skin irritation or problems with the surgical soap (CHG), do not use.  Please get a bar of dial soap or any antibacterial soap and shower following the instructions below.             TAKE A SHOWER THE NIGHT BEFORE SURGERY AND THE DAY OF  SURGERY    Please keep in mind the following:  DO NOT shave, including legs and underarms, 48 hours prior to surgery.   You may shave your face before/day of surgery.  Place clean sheets on your bed the night before surgery Use a clean washcloth (not used since being washed) for each shower. DO NOT sleep with pet's night before surgery.  CHG Shower Instructions:  Wash your face and private area with normal soap. If you choose to wash your hair, wash first with your normal shampoo.  After you use shampoo/soap, rinse your hair and body thoroughly to remove shampoo/soap residue.  Turn the water OFF and apply half the bottle of CHG soap to a CLEAN washcloth.  Apply CHG soap ONLY FROM YOUR NECK DOWN TO YOUR TOES (washing for 3-5 minutes)  DO NOT use CHG soap on face, private areas, open wounds, or sores.  Pay special attention to the area where your surgery is being performed.  If you are having back surgery, having someone wash your back for you may be helpful. Wait 2 minutes after CHG soap is applied, then you may rinse off the CHG soap.  Pat dry with a clean towel  Put on clean pajamas    Additional instructions for the day of surgery: DO NOT APPLY any lotions, oils, deodorants (may use underarm deodorant) , cologne/ perfumes pr makeup  Do not wear jewelry / piercing's/ metal/  permanent jewelry must be removed prior to arrival (this includes plastic piercing) Do not wear nail polish, gel polish, artificial nails, or any other type of covering on natural nails (fingers and toes) Do not bring valuables to the hospital. [facility] is not responsible for valuables/personal belongings. Put on clean/comfortable clothes.  Please brush your teeth.  Ask your nurse before applying any prescription medications to the skin.

## 2023-04-03 NOTE — Progress Notes (Signed)
 Spoke w/ via phone for pre-op interview--- pt Lab needs dos---- cbc, bmp, t&s,  urine preg       Lab results------ current EKG in epic/ chart ;   pt had x2 units PRBC 03-09-2023 and last iron infusion02-28-2025 COVID test -----patient states asymptomatic no test needed Arrive at ------- 0530 on 04-09-2023 NPO after MN NO Solid Food.  Clear liquids from MN until--- 0430 Pre-Surgery Ensure or G2: n/a ERAS protocol:  yes  Med rec completed Medications to take morning of surgery ----- bupropion, viibryd, diltiazem, linzess, synthroid, qulipta, prilosec, plaquenil Diabetic medication ----- n/a  GLP1 agonist last dose: n/a GLP1 instructions:  Patient instructed no nail polish to be worn day of surgery Patient instructed to bring photo id and insurance card day of surgery Patient aware to have Driver (ride ) / caregiver    for 24 hours after surgery - husband, andy Patient Special Instructions ----- will pick up bag w/ CHG and written instructions at appt 04-05-2023 @ 1300 (since pt has to have T&S done dos she will have other labs done [facility] [place])  Pre-Op special Instructions -----  pt instructions from hematology/ oncologist.  Dr Dallas Schimke NP telephone note dated 03-26-2023 in epic with instructions for xarelto, printed copy for chart.  Pt verbalized understanding and had started back taking xarelto on 03-26-2023 , know last dose on 04-06-2023 and start back on 04-10-2023 after surgery.  Patient verbalized understanding of instructions that were given at this phone interview. Patient denies chest pain, sob, fever, cough at the interview.    Anesthesia Review:  dx May-Thurner syndrome & pelvic congestion syndrome 11-2020 ~ s/p coil embolization left ovarian vein 11-17-2020 and  03-2022  s/p MALS release & paraesophageal hernia repair ;  dx right sided CHF 12/ 2022;  hx DVT x2 last one 12/ 2022;  sjogren syndrom w/ cns involvment;  s/p  chiari malformation type 1 decompression 06/ 2023;    PVD  s/p angioplasty and stenting of  left common iliac and left external iliac veins 11-30-2020  PCP:  Durwin Glaze NP  (lov 07-14-2021 Cardiologist :  Dr Reece Agar. Dascanio (lov 02-12-2023) Vascular:  Dr Kirtland Bouchard. Cherly Hensen [facility] 02-23-2022 Neurologist:  Prentiss Bells PA   [facility] 02-14-2023 Rheumatologist:  Dr Melford Aase [facility] 08-02-2022 Hematology/ oncology:  Dr Dallas Schimke NP  [facility] 03-20-2023 [place] pain medicine:  Dr Luella Cook [facility] 02-01-2023   Chest x-ray :  CXR and CTA 09-25-2022 (both in [facility]) EKG :  03-22-2023 epic Echo : 08-25-2021  [facility] Stress test:  stress echo 11-06-2019  ([facility]) Cardiac Cath : 06-16-2021  ([facility])  Activity level:  denies sob w/ her normal activities Sleep Study/ CPAP : no  Blood Thinner/ Instructions Maurice Small Dose:  Xarelto ASA / Instructions/ Last Dose :  pt called herself and spoke to Maralyn Sago NP and recommendation from Dr Myna Hidalgo about Xarelto for surgery.  She is following their instructions see above .

## 2023-04-04 ENCOUNTER — Encounter (HOSPITAL_COMMUNITY): Payer: Self-pay | Admitting: Obstetrics and Gynecology

## 2023-04-05 ENCOUNTER — Other Ambulatory Visit (HOSPITAL_COMMUNITY)

## 2023-04-05 ENCOUNTER — Encounter (HOSPITAL_COMMUNITY): Payer: Self-pay | Admitting: Obstetrics and Gynecology

## 2023-04-08 ENCOUNTER — Encounter: Payer: Self-pay | Admitting: Obstetrics and Gynecology

## 2023-04-08 ENCOUNTER — Encounter (HOSPITAL_COMMUNITY)
Admission: RE | Admit: 2023-04-08 | Discharge: 2023-04-08 | Disposition: A | Discharge status: Home / Self Care | Source: Ambulatory Visit | Attending: Obstetrics and Gynecology | Admitting: Obstetrics and Gynecology

## 2023-04-08 NOTE — Anesthesia Preprocedure Evaluation (Signed)
 Anesthesia Evaluation  Patient identified by MRN, date of birth, ID band Patient awake    Reviewed: Allergy & Precautions, NPO status , Patient's Chart, lab work & pertinent test results  History of Anesthesia Complications Negative for: history of anesthetic complications  Airway Mallampati: II  TM Distance: >3 FB Neck ROM: Full    Dental  (+) Missing,    Pulmonary neg pulmonary ROS   Pulmonary exam normal        Cardiovascular +CHF  Normal cardiovascular exam  May-Thurner syndrome s/p median arcuate ligament release 04-05-2022  Normal TTE 2022   Neuro/Psych  Headaches  Anxiety Depression Bipolar Disorder   Chiari malformation type I s/p suboccipital craniectomy w/ chiari decompression 06-30-2021    GI/Hepatic negative GI ROS, Neg liver ROS,,,  Endo/Other  Hypothyroidism    Renal/GU negative Renal ROS  negative genitourinary   Musculoskeletal negative musculoskeletal ROS (+)    Abdominal   Peds  Hematology negative hematology ROS (+)   Anesthesia Other Findings Day of surgery medications reviewed with patient.  Reproductive/Obstetrics negative OB ROS                              Anesthesia Physical Anesthesia Plan  ASA: 2  Anesthesia Plan: General   Post-op Pain Management: Tylenol PO (pre-op)*, Ketamine IV*, Precedex and Toradol IV (intra-op)*   Induction: Intravenous  PONV Risk Score and Plan: 4 or greater and Midazolam, Scopolamine patch - Pre-op, Propofol infusion, Dexamethasone, Ondansetron, Treatment may vary due to age or medical condition and TIVA  Airway Management Planned: Oral ETT  Additional Equipment: None  Intra-op Plan:   Post-operative Plan: Extubation in OR  Informed Consent: I have reviewed the patients History and Physical, chart, labs and discussed the procedure including the risks, benefits and alternatives for the proposed anesthesia with the patient  or authorized representative who has indicated his/her understanding and acceptance.     Dental advisory given  Plan Discussed with: CRNA  Anesthesia Plan Comments:         Anesthesia Quick Evaluation

## 2023-04-09 ENCOUNTER — Inpatient Hospital Stay (HOSPITAL_COMMUNITY)
Admission: RE | Admit: 2023-04-09 | Discharge: 2023-04-12 | DRG: 742 | Disposition: A | Discharge status: Home / Self Care | Attending: Obstetrics and Gynecology | Admitting: Obstetrics and Gynecology

## 2023-04-09 ENCOUNTER — Inpatient Hospital Stay (HOSPITAL_COMMUNITY): Payer: Self-pay | Admitting: Anesthesiology

## 2023-04-09 ENCOUNTER — Observation Stay (HOSPITAL_COMMUNITY)

## 2023-04-09 ENCOUNTER — Encounter (HOSPITAL_COMMUNITY): Payer: Self-pay | Admitting: Obstetrics and Gynecology

## 2023-04-09 ENCOUNTER — Other Ambulatory Visit: Payer: Self-pay

## 2023-04-09 ENCOUNTER — Encounter (HOSPITAL_COMMUNITY)
Admission: RE | Disposition: A | Discharge status: Home / Self Care | Payer: Self-pay | Source: Home / Self Care | Attending: Obstetrics and Gynecology

## 2023-04-09 DIAGNOSIS — Z01818 Encounter for other preprocedural examination: Secondary | ICD-10-CM

## 2023-04-09 DIAGNOSIS — S301XXA Contusion of abdominal wall, initial encounter: Secondary | ICD-10-CM | POA: Diagnosis not present

## 2023-04-09 DIAGNOSIS — N736 Female pelvic peritoneal adhesions (postinfective): Secondary | ICD-10-CM | POA: Diagnosis not present

## 2023-04-09 DIAGNOSIS — E861 Hypovolemia: Secondary | ICD-10-CM | POA: Diagnosis not present

## 2023-04-09 DIAGNOSIS — N938 Other specified abnormal uterine and vaginal bleeding: Secondary | ICD-10-CM | POA: Diagnosis not present

## 2023-04-09 DIAGNOSIS — L7632 Postprocedural hematoma of skin and subcutaneous tissue following other procedure: Secondary | ICD-10-CM | POA: Diagnosis not present

## 2023-04-09 DIAGNOSIS — I739 Peripheral vascular disease, unspecified: Secondary | ICD-10-CM | POA: Diagnosis not present

## 2023-04-09 DIAGNOSIS — Z79899 Other long term (current) drug therapy: Secondary | ICD-10-CM

## 2023-04-09 DIAGNOSIS — N939 Abnormal uterine and vaginal bleeding, unspecified: Principal | ICD-10-CM

## 2023-04-09 DIAGNOSIS — Z98891 History of uterine scar from previous surgery: Secondary | ICD-10-CM | POA: Diagnosis not present

## 2023-04-09 DIAGNOSIS — Z7901 Long term (current) use of anticoagulants: Secondary | ICD-10-CM

## 2023-04-09 DIAGNOSIS — E039 Hypothyroidism, unspecified: Secondary | ICD-10-CM | POA: Diagnosis not present

## 2023-04-09 DIAGNOSIS — Z86718 Personal history of other venous thrombosis and embolism: Secondary | ICD-10-CM

## 2023-04-09 DIAGNOSIS — Z90710 Acquired absence of both cervix and uterus: Secondary | ICD-10-CM | POA: Diagnosis present

## 2023-04-09 DIAGNOSIS — I959 Hypotension, unspecified: Secondary | ICD-10-CM | POA: Diagnosis not present

## 2023-04-09 DIAGNOSIS — D259 Leiomyoma of uterus, unspecified: Secondary | ICD-10-CM

## 2023-04-09 DIAGNOSIS — N888 Other specified noninflammatory disorders of cervix uteri: Secondary | ICD-10-CM | POA: Diagnosis not present

## 2023-04-09 DIAGNOSIS — Z79890 Hormone replacement therapy (postmenopausal): Secondary | ICD-10-CM | POA: Diagnosis not present

## 2023-04-09 DIAGNOSIS — N8003 Adenomyosis of the uterus: Secondary | ICD-10-CM | POA: Diagnosis present

## 2023-04-09 DIAGNOSIS — G894 Chronic pain syndrome: Secondary | ICD-10-CM | POA: Diagnosis not present

## 2023-04-09 DIAGNOSIS — I50812 Chronic right heart failure: Secondary | ICD-10-CM | POA: Diagnosis not present

## 2023-04-09 HISTORY — DX: Abnormal uterine and vaginal bleeding, unspecified: N93.9

## 2023-04-09 HISTORY — DX: Other specified conditions associated with female genital organs and menstrual cycle: N94.89

## 2023-04-09 HISTORY — DX: Other cervical disc degeneration, unspecified cervical region: M50.30

## 2023-04-09 HISTORY — DX: Chronic pain syndrome: G89.4

## 2023-04-09 HISTORY — DX: Sjogren syndrome with central nervous system involvement: M35.07

## 2023-04-09 HISTORY — DX: Long term (current) use of anticoagulants: Z79.01

## 2023-04-09 HISTORY — DX: Peripheral vascular disease, unspecified: I73.9

## 2023-04-09 HISTORY — DX: Iron deficiency anemia secondary to blood loss (chronic): D50.0

## 2023-04-09 HISTORY — DX: Other intervertebral disc degeneration, lumbosacral region without mention of lumbar back pain or lower extremity pain: M51.379

## 2023-04-09 HISTORY — DX: Migraine without aura, intractable, without status migrainosus: G43.019

## 2023-04-09 HISTORY — DX: Personal history of other venous thrombosis and embolism: Z86.718

## 2023-04-09 HISTORY — PX: CYSTOSCOPY: SHX5120

## 2023-04-09 HISTORY — DX: Generalized anxiety disorder: F41.1

## 2023-04-09 HISTORY — DX: Celiac artery compression syndrome: I77.4

## 2023-04-09 HISTORY — DX: Major depressive disorder, single episode, unspecified: F32.9

## 2023-04-09 HISTORY — DX: Localized edema: R60.0

## 2023-04-09 HISTORY — PX: TOTAL LAPAROSCOPIC HYSTERECTOMY WITH SALPINGECTOMY: SHX6742

## 2023-04-09 LAB — HEMOGLOBIN AND HEMATOCRIT, BLOOD
HCT: 31.6 % — ABNORMAL LOW (ref 36.0–46.0)
Hemoglobin: 10.4 g/dL — ABNORMAL LOW (ref 12.0–15.0)

## 2023-04-09 LAB — CBC
HCT: 39 % (ref 36.0–46.0)
HCT: 30.5 % — ABNORMAL LOW (ref 36.0–46.0)
HCT: 26.8 % — ABNORMAL LOW (ref 36.0–46.0)
Hemoglobin: 13.1 g/dL (ref 12.0–15.0)
Hemoglobin: 10 g/dL — ABNORMAL LOW (ref 12.0–15.0)
Hemoglobin: 8.9 g/dL — ABNORMAL LOW (ref 12.0–15.0)
MCH: 30.8 pg (ref 26.0–34.0)
MCH: 30.6 pg (ref 26.0–34.0)
MCH: 30.3 pg (ref 26.0–34.0)
MCHC: 33.6 g/dL (ref 30.0–36.0)
MCHC: 32.8 g/dL (ref 30.0–36.0)
MCHC: 33.2 g/dL (ref 30.0–36.0)
MCV: 91.8 fL (ref 80.0–100.0)
MCV: 93.3 fL (ref 80.0–100.0)
MCV: 91.2 fL (ref 80.0–100.0)
Platelets: 313 10*3/uL (ref 150–400)
Platelets: 278 10*3/uL (ref 150–400)
Platelets: 246 10*3/uL (ref 150–400)
RBC: 4.25 MIL/uL (ref 3.87–5.11)
RBC: 3.27 MIL/uL — ABNORMAL LOW (ref 3.87–5.11)
RBC: 2.94 MIL/uL — ABNORMAL LOW (ref 3.87–5.11)
RDW: 13.2 % (ref 11.5–15.5)
RDW: 12.8 % (ref 11.5–15.5)
RDW: 13 % (ref 11.5–15.5)
WBC: 7 10*3/uL (ref 4.0–10.5)
WBC: 21.2 10*3/uL — ABNORMAL HIGH (ref 4.0–10.5)
WBC: 12.7 10*3/uL — ABNORMAL HIGH (ref 4.0–10.5)
nRBC: 0 % (ref 0.0–0.2)
nRBC: 0 % (ref 0.0–0.2)
nRBC: 0 % (ref 0.0–0.2)

## 2023-04-09 LAB — BASIC METABOLIC PANEL WITH GFR
Anion gap: 8 (ref 5–15)
BUN: 14 mg/dL (ref 6–20)
CO2: 24 mmol/L (ref 22–32)
Calcium: 9.3 mg/dL (ref 8.9–10.3)
Chloride: 108 mmol/L (ref 98–111)
Creatinine, Ser: 0.97 mg/dL (ref 0.44–1.00)
GFR, Estimated: >60 mL/min (ref >60)
Glucose, Bld: 78 mg/dL (ref 70–99)
Potassium: 3.5 mmol/L (ref 3.5–5.1)
Sodium: 140 mmol/L (ref 135–145)

## 2023-04-09 SURGERY — HYSTERECTOMY, TOTAL, LAPAROSCOPIC, WITH SALPINGECTOMY
Anesthesia: General

## 2023-04-09 MED ORDER — GLYCOPYRROLATE PF 0.2 MG/ML IJ SOSY
PREFILLED_SYRINGE | INTRAMUSCULAR | Status: DC | PRN
  Administered 2023-04-09: .1 mg via INTRAVENOUS

## 2023-04-09 MED ORDER — CHLORHEXIDINE GLUCONATE 0.12 % MT SOLN
15.0000 mL | Freq: Once | OROMUCOSAL | Status: AC
  Administered 2023-04-09: 15 mL via OROMUCOSAL
  Filled 2023-04-09: qty 15

## 2023-04-09 MED ORDER — LACTATED RINGERS IV BOLUS
1000.0000 mL | Freq: Once | INTRAVENOUS | Status: AC
  Administered 2023-04-09: 1000 mL via INTRAVENOUS

## 2023-04-09 MED ORDER — HYDROMORPHONE HCL 1 MG/ML IJ SOLN
INTRAMUSCULAR | Status: AC
  Filled 2023-04-09: qty 1

## 2023-04-09 MED ORDER — LIDOCAINE 2% (20 MG/ML) 5 ML SYRINGE
INTRAMUSCULAR | Status: DC | PRN
  Administered 2023-04-09: 100 mg via INTRAVENOUS

## 2023-04-09 MED ORDER — CEFAZOLIN SODIUM 1 G IJ SOLR
INTRAMUSCULAR | Status: AC
  Filled 2023-04-09: qty 20

## 2023-04-09 MED ORDER — KETOROLAC TROMETHAMINE 30 MG/ML IJ SOLN
15.0000 mg | Freq: Four times a day (QID) | INTRAMUSCULAR | Status: AC
  Administered 2023-04-09 – 2023-04-10 (×4): 15 mg via INTRAVENOUS
  Filled 2023-04-09 (×4): qty 1

## 2023-04-09 MED ORDER — DEXAMETHASONE SODIUM PHOSPHATE 10 MG/ML IJ SOLN
INTRAMUSCULAR | Status: AC
  Filled 2023-04-09: qty 1

## 2023-04-09 MED ORDER — ATOGEPANT 60 MG PO TABS: 60.0000 mg | ORAL_TABLET | Freq: Every morning | ORAL | Status: DC

## 2023-04-09 MED ORDER — LEVOTHYROXINE SODIUM 75 MCG PO TABS
75.0000 ug | ORAL_TABLET | Freq: Every day | ORAL | Status: DC
  Administered 2023-04-10 – 2023-04-12 (×3): 75 ug via ORAL
  Filled 2023-04-09: qty 3
  Filled 2023-04-09 (×2): qty 1
  Filled 2023-04-09: qty 3
  Filled 2023-04-09: qty 1
  Filled 2023-04-09: qty 3

## 2023-04-09 MED ORDER — HYDROMORPHONE HCL 1 MG/ML IJ SOLN
0.2500 mg | INTRAMUSCULAR | Status: DC | PRN
  Administered 2023-04-09 (×3): 0.5 mg via INTRAVENOUS

## 2023-04-09 MED ORDER — ROCURONIUM BROMIDE 10 MG/ML (PF) SYRINGE
PREFILLED_SYRINGE | INTRAVENOUS | Status: DC | PRN
  Administered 2023-04-09 (×3): 20 mg via INTRAVENOUS
  Administered 2023-04-09: 60 mg via INTRAVENOUS
  Administered 2023-04-09: 10 mg via INTRAVENOUS
  Administered 2023-04-09: 40 mg via INTRAVENOUS
  Administered 2023-04-09: 20 mg via INTRAVENOUS

## 2023-04-09 MED ORDER — SUGAMMADEX SODIUM 200 MG/2ML IV SOLN
INTRAVENOUS | Status: DC | PRN
  Administered 2023-04-09: 200 mg via INTRAVENOUS
  Administered 2023-04-09: 100 mg via INTRAVENOUS

## 2023-04-09 MED ORDER — ALPRAZOLAM 0.5 MG PO TABS
0.2500 mg | ORAL_TABLET | Freq: Three times a day (TID) | ORAL | Status: DC | PRN
  Administered 2023-04-09: 0.25 mg via ORAL
  Filled 2023-04-09: qty 1

## 2023-04-09 MED ORDER — SODIUM CHLORIDE 0.9 % IR SOLN
Status: DC | PRN
  Administered 2023-04-09: 1000 mL
  Administered 2023-04-09: 1000 mL via INTRAVESICAL

## 2023-04-09 MED ORDER — PROPOFOL 1000 MG/100ML IV EMUL
INTRAVENOUS | Status: AC
  Filled 2023-04-09: qty 300

## 2023-04-09 MED ORDER — DEXTROSE-SODIUM CHLORIDE 5-0.9 % IV SOLN
INTRAVENOUS | Status: AC
Start: 2023-04-09 — End: 2023-04-10

## 2023-04-09 MED ORDER — LACTATED RINGERS IV SOLN: INTRAVENOUS | Status: DC

## 2023-04-09 MED ORDER — HYDROMORPHONE HCL 1 MG/ML IJ SOLN
0.2000 mg | INTRAMUSCULAR | Status: DC | PRN
  Administered 2023-04-10 (×2): 0.5 mg via INTRAVENOUS
  Administered 2023-04-11: 0.6 mg via INTRAVENOUS
  Filled 2023-04-09 (×5): qty 1

## 2023-04-09 MED ORDER — SODIUM CHLORIDE (PF) 0.9 % IJ SOLN
INTRAMUSCULAR | Status: AC
Start: 2023-04-09 — End: ?
  Filled 2023-04-09: qty 10

## 2023-04-09 MED ORDER — PROPOFOL 10 MG/ML IV BOLUS
INTRAVENOUS | Status: AC
  Filled 2023-04-09: qty 20

## 2023-04-09 MED ORDER — FOLIC ACID 1 MG PO TABS
1.0000 mg | ORAL_TABLET | Freq: Every day | ORAL | Status: DC
  Administered 2023-04-10 – 2023-04-12 (×3): 1 mg via ORAL
  Filled 2023-04-09 (×3): qty 1

## 2023-04-09 MED ORDER — LACTATED RINGERS IV BOLUS
500.0000 mL | Freq: Once | INTRAVENOUS | Status: AC | PRN
  Administered 2023-04-09: 500 mL via INTRAVENOUS

## 2023-04-09 MED ORDER — MIDAZOLAM HCL 2 MG/2ML IJ SOLN
INTRAMUSCULAR | Status: AC
  Filled 2023-04-09: qty 2

## 2023-04-09 MED ORDER — DEXAMETHASONE SODIUM PHOSPHATE 10 MG/ML IJ SOLN
INTRAMUSCULAR | Status: DC | PRN
  Administered 2023-04-09: 10 mg via INTRAVENOUS

## 2023-04-09 MED ORDER — ACETAMINOPHEN 500 MG PO TABS
1000.0000 mg | ORAL_TABLET | Freq: Once | ORAL | Status: DC
Start: 2023-04-09 — End: 2023-04-09

## 2023-04-09 MED ORDER — ORAL CARE MOUTH RINSE: 15.0000 mL | Freq: Once | OROMUCOSAL | Status: AC

## 2023-04-09 MED ORDER — ALBUMIN HUMAN 5 % IV SOLN
INTRAVENOUS | Status: AC
  Filled 2023-04-09: qty 250

## 2023-04-09 MED ORDER — MENTHOL 3 MG MT LOZG: 1.0000 | LOZENGE | OROMUCOSAL | Status: DC | PRN

## 2023-04-09 MED ORDER — CEFAZOLIN SODIUM-DEXTROSE 2-4 GM/100ML-% IV SOLN
2.0000 g | INTRAVENOUS | Status: AC
  Administered 2023-04-09 (×2): 2 g via INTRAVENOUS
  Filled 2023-04-09: qty 100

## 2023-04-09 MED ORDER — DEXMEDETOMIDINE HCL IN NACL 80 MCG/20ML IV SOLN
INTRAVENOUS | Status: DC | PRN
Start: 2023-04-09 — End: 2023-04-09
  Administered 2023-04-09: 8 ug via INTRAVENOUS

## 2023-04-09 MED ORDER — ONDANSETRON HCL 4 MG/2ML IJ SOLN
INTRAMUSCULAR | Status: DC | PRN
  Administered 2023-04-09: 4 mg via INTRAVENOUS

## 2023-04-09 MED ORDER — ONDANSETRON HCL 4 MG/2ML IJ SOLN
4.0000 mg | Freq: Four times a day (QID) | INTRAMUSCULAR | Status: DC | PRN
Start: 2023-04-09 — End: 2023-04-12

## 2023-04-09 MED ORDER — MECLIZINE HCL 25 MG PO TABS: 25.0000 mg | ORAL_TABLET | Freq: Three times a day (TID) | ORAL | Status: DC | PRN

## 2023-04-09 MED ORDER — EPHEDRINE SULFATE-NACL 50-0.9 MG/10ML-% IV SOSY
PREFILLED_SYRINGE | INTRAVENOUS | Status: DC | PRN
  Administered 2023-04-09: 5 mg via INTRAVENOUS
  Administered 2023-04-09: 10 mg via INTRAVENOUS
  Administered 2023-04-09 (×2): 5 mg via INTRAVENOUS

## 2023-04-09 MED ORDER — BUPROPION HCL ER (XL) 150 MG PO TB24
450.0000 mg | ORAL_TABLET | Freq: Every day | ORAL | Status: DC
  Administered 2023-04-10 – 2023-04-12 (×3): 450 mg via ORAL
  Filled 2023-04-09 (×3): qty 3

## 2023-04-09 MED ORDER — EPHEDRINE 5 MG/ML INJ
INTRAVENOUS | Status: AC
  Filled 2023-04-09: qty 5

## 2023-04-09 MED ORDER — SENNA 8.6 MG PO TABS
1.0000 | ORAL_TABLET | Freq: Two times a day (BID) | ORAL | Status: DC
  Administered 2023-04-09 – 2023-04-12 (×6): 8.6 mg via ORAL
  Filled 2023-04-09 (×6): qty 1

## 2023-04-09 MED ORDER — MIDAZOLAM HCL 5 MG/5ML IJ SOLN
INTRAMUSCULAR | Status: DC | PRN
  Administered 2023-04-09: 2 mg via INTRAVENOUS

## 2023-04-09 MED ORDER — ONDANSETRON HCL 4 MG PO TABS
4.0000 mg | ORAL_TABLET | Freq: Four times a day (QID) | ORAL | Status: DC | PRN
Start: 2023-04-09 — End: 2023-04-12
  Administered 2023-04-11 (×2): 4 mg via ORAL
  Filled 2023-04-09: qty 1

## 2023-04-09 MED ORDER — ONDANSETRON HCL 4 MG/2ML IJ SOLN
INTRAMUSCULAR | Status: AC
  Filled 2023-04-09: qty 2

## 2023-04-09 MED ORDER — ROCURONIUM BROMIDE 10 MG/ML (PF) SYRINGE
PREFILLED_SYRINGE | INTRAVENOUS | Status: AC
  Filled 2023-04-09: qty 10

## 2023-04-09 MED ORDER — ACETAMINOPHEN 500 MG PO TABS
1000.0000 mg | ORAL_TABLET | Freq: Four times a day (QID) | ORAL | Status: DC
  Administered 2023-04-09 – 2023-04-12 (×10): 1000 mg via ORAL
  Filled 2023-04-09 (×12): qty 2

## 2023-04-09 MED ORDER — IBUPROFEN 600 MG PO TABS
600.0000 mg | ORAL_TABLET | Freq: Four times a day (QID) | ORAL | Status: DC
  Administered 2023-04-10 – 2023-04-12 (×6): 600 mg via ORAL
  Filled 2023-04-09 (×6): qty 1

## 2023-04-09 MED ORDER — ACETAMINOPHEN 10 MG/ML IV SOLN
1000.0000 mg | Freq: Once | INTRAVENOUS | Status: AC
  Administered 2023-04-09: 1000 mg via INTRAVENOUS

## 2023-04-09 MED ORDER — OXYCODONE HCL 5 MG PO TABS
5.0000 mg | ORAL_TABLET | ORAL | Status: DC | PRN
  Administered 2023-04-09: 5 mg via ORAL
  Administered 2023-04-10 – 2023-04-11 (×4): 10 mg via ORAL
  Administered 2023-04-11: 5 mg via ORAL
  Administered 2023-04-11: 10 mg via ORAL
  Filled 2023-04-09 (×3): qty 2
  Filled 2023-04-09: qty 1
  Filled 2023-04-09 (×3): qty 2

## 2023-04-09 MED ORDER — ACETAMINOPHEN 500 MG PO TABS
1000.0000 mg | ORAL_TABLET | ORAL | Status: AC
  Administered 2023-04-09: 1000 mg via ORAL
  Filled 2023-04-09: qty 2

## 2023-04-09 MED ORDER — SCOPOLAMINE 1 MG/3DAYS TD PT72
1.0000 | MEDICATED_PATCH | Freq: Once | TRANSDERMAL | Status: DC
  Administered 2023-04-09: 1.5 mg via TRANSDERMAL
  Filled 2023-04-09: qty 1

## 2023-04-09 MED ORDER — FENTANYL CITRATE (PF) 250 MCG/5ML IJ SOLN
INTRAMUSCULAR | Status: AC
  Filled 2023-04-09: qty 5

## 2023-04-09 MED ORDER — IOHEXOL 350 MG/ML SOLN
100.0000 mL | Freq: Once | INTRAVENOUS | Status: AC | PRN
  Administered 2023-04-09: 100 mL via INTRAVENOUS

## 2023-04-09 MED ORDER — LURASIDONE HCL 40 MG PO TABS
40.0000 mg | ORAL_TABLET | Freq: Every evening | ORAL | Status: DC
  Administered 2023-04-09 – 2023-04-11 (×3): 40 mg via ORAL
  Filled 2023-04-09 (×3): qty 1

## 2023-04-09 MED ORDER — VILAZODONE HCL 20 MG PO TABS
40.0000 mg | ORAL_TABLET | Freq: Every day | ORAL | Status: DC
  Administered 2023-04-10 – 2023-04-12 (×3): 40 mg via ORAL
  Filled 2023-04-09 (×3): qty 2

## 2023-04-09 MED ORDER — LIDOCAINE 2% (20 MG/ML) 5 ML SYRINGE
INTRAMUSCULAR | Status: AC
  Filled 2023-04-09: qty 5

## 2023-04-09 MED ORDER — ALBUMIN HUMAN 5 % IV SOLN
12.5000 g | Freq: Once | INTRAVENOUS | Status: AC
  Administered 2023-04-09: 12.5 g via INTRAVENOUS

## 2023-04-09 MED ORDER — FENTANYL CITRATE (PF) 100 MCG/2ML IJ SOLN
INTRAMUSCULAR | Status: DC | PRN
  Administered 2023-04-09: 50 ug via INTRAVENOUS
  Administered 2023-04-09: 25 ug via INTRAVENOUS
  Administered 2023-04-09: 50 ug via INTRAVENOUS
  Administered 2023-04-09: 25 ug via INTRAVENOUS
  Administered 2023-04-09 (×2): 50 ug via INTRAVENOUS

## 2023-04-09 MED ORDER — DILTIAZEM HCL ER COATED BEADS 240 MG PO CP24
240.0000 mg | ORAL_CAPSULE | Freq: Every day | ORAL | Status: DC
  Administered 2023-04-11 – 2023-04-12 (×2): 240 mg via ORAL
  Filled 2023-04-09 (×4): qty 1

## 2023-04-09 MED ORDER — ALBUMIN HUMAN 5 % IV SOLN
25.0000 g | Freq: Once | INTRAVENOUS | Status: AC
  Administered 2023-04-10: 25 g via INTRAVENOUS
  Filled 2023-04-09: qty 500

## 2023-04-09 MED ORDER — DROPERIDOL 2.5 MG/ML IJ SOLN
0.6250 mg | Freq: Once | INTRAMUSCULAR | Status: AC | PRN
  Administered 2023-04-09: 0.625 mg via INTRAVENOUS

## 2023-04-09 MED ORDER — POVIDONE-IODINE 10 % EX SWAB: 2.0000 | Freq: Once | CUTANEOUS | Status: DC

## 2023-04-09 MED ORDER — PROPOFOL 500 MG/50ML IV EMUL
INTRAVENOUS | Status: DC | PRN
  Administered 2023-04-09: 100 ug/kg/min via INTRAVENOUS
  Administered 2023-04-09: 125 ug/kg/min via INTRAVENOUS
  Administered 2023-04-09: 200 ug/kg/min via INTRAVENOUS

## 2023-04-09 MED ORDER — HEPARIN SODIUM (PORCINE) 5000 UNIT/ML IJ SOLN
5000.0000 [IU] | INTRAMUSCULAR | Status: AC
  Administered 2023-04-09: 5000 [IU] via SUBCUTANEOUS
  Filled 2023-04-09: qty 1

## 2023-04-09 MED ORDER — LAMOTRIGINE 25 MG PO TABS
50.0000 mg | ORAL_TABLET | Freq: Every day | ORAL | Status: DC
  Administered 2023-04-09 – 2023-04-11 (×3): 50 mg via ORAL
  Filled 2023-04-09 (×3): qty 2

## 2023-04-09 MED ORDER — SPIRONOLACTONE 25 MG PO TABS
25.0000 mg | ORAL_TABLET | Freq: Every day | ORAL | Status: DC
  Administered 2023-04-09 – 2023-04-12 (×3): 25 mg via ORAL
  Filled 2023-04-09 (×4): qty 1

## 2023-04-09 MED ORDER — ALUM & MAG HYDROXIDE-SIMETH 200-200-20 MG/5ML PO SUSP
30.0000 mL | ORAL | Status: DC | PRN
  Administered 2023-04-09: 30 mL via ORAL
  Filled 2023-04-09: qty 30

## 2023-04-09 MED ORDER — CELECOXIB 200 MG PO CAPS
400.0000 mg | ORAL_CAPSULE | ORAL | Status: AC
  Administered 2023-04-09: 400 mg via ORAL
  Filled 2023-04-09: qty 2

## 2023-04-09 MED ORDER — HYDROXYCHLOROQUINE SULFATE 200 MG PO TABS
400.0000 mg | ORAL_TABLET | Freq: Every day | ORAL | Status: DC
  Administered 2023-04-10 – 2023-04-12 (×3): 400 mg via ORAL
  Filled 2023-04-09 (×3): qty 2

## 2023-04-09 MED ORDER — LINACLOTIDE 145 MCG PO CAPS
290.0000 ug | ORAL_CAPSULE | Freq: Every day | ORAL | Status: DC
  Administered 2023-04-10 – 2023-04-12 (×3): 290 ug via ORAL
  Filled 2023-04-09 (×3): qty 2

## 2023-04-09 MED ORDER — 0.9 % SODIUM CHLORIDE (POUR BTL) OPTIME
TOPICAL | Status: DC | PRN
  Administered 2023-04-09: 1000 mL

## 2023-04-09 MED ORDER — PHENYLEPHRINE HCL-NACL 20-0.9 MG/250ML-% IV SOLN
INTRAVENOUS | Status: DC | PRN
  Administered 2023-04-09: 40 ug/min via INTRAVENOUS

## 2023-04-09 MED ORDER — HEMOSTATIC AGENTS (NO CHARGE) OPTIME
TOPICAL | Status: DC | PRN
  Administered 2023-04-09: 1 via TOPICAL

## 2023-04-09 MED ORDER — PHENYLEPHRINE 80 MCG/ML (10ML) SYRINGE FOR IV PUSH (FOR BLOOD PRESSURE SUPPORT)
PREFILLED_SYRINGE | INTRAVENOUS | Status: DC | PRN
  Administered 2023-04-09 (×2): 160 ug via INTRAVENOUS
  Administered 2023-04-09 (×2): 80 ug via INTRAVENOUS
  Administered 2023-04-09 (×2): 160 ug via INTRAVENOUS

## 2023-04-09 MED ORDER — METHYLENE BLUE (ANTIDOTE) 1 % IV SOLN
INTRAVENOUS | Status: AC
  Filled 2023-04-09: qty 10

## 2023-04-09 MED ORDER — DROPERIDOL 2.5 MG/ML IJ SOLN
INTRAMUSCULAR | Status: AC
Start: 2023-04-09 — End: 2023-04-10
  Filled 2023-04-09: qty 2

## 2023-04-09 MED ORDER — DEXMEDETOMIDINE HCL IN NACL 80 MCG/20ML IV SOLN
INTRAVENOUS | Status: AC
  Filled 2023-04-09: qty 20

## 2023-04-09 MED ORDER — ZOLPIDEM TARTRATE 5 MG PO TABS: 5.0000 mg | ORAL_TABLET | Freq: Every evening | ORAL | Status: DC | PRN

## 2023-04-09 MED ORDER — PROPOFOL 10 MG/ML IV BOLUS
INTRAVENOUS | Status: DC | PRN
  Administered 2023-04-09: 200 mg via INTRAVENOUS

## 2023-04-09 SURGICAL SUPPLY — 63 items
APPLICATOR ARISTA FLEXITIP XL (MISCELLANEOUS) IMPLANT
BLADE SURG 10 STRL SS (BLADE) IMPLANT
CABLE HIGH FREQUENCY MONO STRZ (ELECTRODE) IMPLANT
COVER MAYO STAND STRL (DRAPES) ×2 IMPLANT
COVER SURGICAL LIGHT HANDLE (MISCELLANEOUS) IMPLANT
DEFOGGER SCOPE WARMER CLEARIFY (MISCELLANEOUS) ×2 IMPLANT
DERMABOND ADVANCED .7 DNX12 (GAUZE/BANDAGES/DRESSINGS) ×2 IMPLANT
DRAPE SURG IRRIG POUCH 19X23 (DRAPES) ×4 IMPLANT
DRSG OPSITE POSTOP 3X4 (GAUZE/BANDAGES/DRESSINGS) IMPLANT
DURAPREP 26ML APPLICATOR (WOUND CARE) ×2 IMPLANT
ELECT L-HOOK LAP 45CM DISP (ELECTROSURGICAL) ×2 IMPLANT
ELECTRODE L-HOOK LAP 45CM DISP (ELECTROSURGICAL) IMPLANT
GAUZE SPONGE 4X4 12PLY STRL (GAUZE/BANDAGES/DRESSINGS) IMPLANT
GAUZE SPONGE 4X4 12PLY STRL LF (GAUZE/BANDAGES/DRESSINGS) IMPLANT
GAUZE SPONGE 4X4 16PLY XRAY LF (GAUZE/BANDAGES/DRESSINGS) IMPLANT
GLOVE BIO SURGEON STRL SZ7 (GLOVE) ×2 IMPLANT
GLOVE BIOGEL PI IND STRL 7.0 (GLOVE) ×8 IMPLANT
GOWN STRL REUS W/ TWL XL LVL3 (GOWN DISPOSABLE) ×4 IMPLANT
HEMOSTAT ARISTA ABSORB 3G PWDR (HEMOSTASIS) IMPLANT
HIBICLENS CHG 4% 4OZ BTL (MISCELLANEOUS) ×4 IMPLANT
IRRIG SUCT STRYKERFLOW 2 WTIP (MISCELLANEOUS) ×4 IMPLANT
IRRIGATION SUCT STRKRFLW 2 WTP (MISCELLANEOUS) ×2 IMPLANT
KIT PINK PAD W/HEAD ARE REST (MISCELLANEOUS) ×2 IMPLANT
KIT PINK PAD W/HEAD ARM REST (MISCELLANEOUS) ×2 IMPLANT
KIT TURNOVER KIT B (KITS) ×2 IMPLANT
L-HOOK LAP DISP 36CM (ELECTROSURGICAL) ×2 IMPLANT
LEGGING LITHOTOMY PAIR STRL (DRAPES) IMPLANT
LHOOK LAP DISP 36CM (ELECTROSURGICAL) ×2 IMPLANT
LIGASURE VESSEL 5MM BLUNT TIP (ELECTROSURGICAL) IMPLANT
NDL INSUFFLATION 14GA 120MM (NEEDLE) IMPLANT
NEEDLE INSUFFLATION 14GA 120MM (NEEDLE) IMPLANT
NS IRRIG 1000ML POUR BTL (IV SOLUTION) ×2 IMPLANT
OCCLUDER COLPOPNEUMO (BALLOONS) ×2 IMPLANT
PACK LAPAROSCOPY BASIN (CUSTOM PROCEDURE TRAY) ×2 IMPLANT
PACK ROBOTIC GOWN (GOWN DISPOSABLE) ×2 IMPLANT
PAD POSITIONING PINK XL (MISCELLANEOUS) ×2 IMPLANT
PENCIL BUTTON HOLSTER BLD 10FT (ELECTRODE) ×2 IMPLANT
POUCH LAPAROSCOPIC INSTRUMENT (MISCELLANEOUS) ×2 IMPLANT
SCISSORS LAP 5X35 DISP (ENDOMECHANICALS) IMPLANT
SEALER TISSUE G2 CVD JAW 35 (ENDOMECHANICALS) IMPLANT
SET CYSTO W/LG BORE CLAMP LF (SET/KITS/TRAYS/PACK) ×2 IMPLANT
SET TUBE SMOKE EVAC HIGH FLOW (TUBING) ×2 IMPLANT
SLEEVE ADV FIXATION 5X100MM (TROCAR) IMPLANT
SLEEVE Z-THREAD 5X100MM (TROCAR) ×4 IMPLANT
SUT PDS AB 2-0 CT2 27 (SUTURE) ×4 IMPLANT
SUT STRATA PDS 2-0 23 CT-1 (SUTURE) ×2 IMPLANT
SUT VIC AB 0 CT1 27XBRD ANBCTR (SUTURE) ×2 IMPLANT
SUT VICRYL 0 UR6 27IN ABS (SUTURE) ×4 IMPLANT
SUT VICRYL 4-0 PS2 18IN ABS (SUTURE) ×2 IMPLANT
SUT VLOC 180 0 9IN GS21 (SUTURE) IMPLANT
SYR 10ML LL (SYRINGE) ×2 IMPLANT
SYR 30ML LL (SYRINGE) IMPLANT
SYR 50ML LL SCALE MARK (SYRINGE) ×2 IMPLANT
TIP RUMI ORANGE 6.7MMX12CM (TIP) IMPLANT
TIP UTERINE 5.1X6CM LAV DISP (MISCELLANEOUS) IMPLANT
TIP UTERINE 6.7X10CM GRN DISP (MISCELLANEOUS) IMPLANT
TIP UTERINE 6.7X6CM WHT DISP (MISCELLANEOUS) IMPLANT
TIP UTERINE 6.7X8CM BLUE DISP (MISCELLANEOUS) IMPLANT
TOWEL GREEN STERILE FF (TOWEL DISPOSABLE) ×4 IMPLANT
TRAY FOLEY W/BAG SLVR 14FR (SET/KITS/TRAYS/PACK) ×2 IMPLANT
TROCAR ADV FIXATION 11X100MM (TROCAR) ×2 IMPLANT
TROCAR Z-THREAD FIOS 5X100MM (TROCAR) ×2 IMPLANT
UNDERPAD 30X36 HEAVY ABSORB (UNDERPADS AND DIAPERS) ×2 IMPLANT

## 2023-04-09 NOTE — Transfer of Care (Signed)
 Immediate Anesthesia Transfer of Care Note  Patient: Yesenia Harper  Procedure(s) Performed: HYSTERECTOMY, TOTAL, LAPAROSCOPIC, WITH SALPINGECTOMY LYSIS OF ADHESIONS (Bilateral) CYSTOSCOPY  Patient Location: PACU  Anesthesia Type:General  Level of Consciousness: drowsy and responds to stimulation  Airway & Oxygen Therapy: Patient Spontanous Breathing and Patient connected to face mask oxygen  Post-op Assessment: Report given to RN and Post -op Vital signs reviewed and stable  Post vital signs: Reviewed and stable Dr. Stephannie Peters called for restart of Phenylephrine gtt. Neo gtt infusing at 40 mcg/ min.  Last Vitals:  Vitals Value Taken Time  BP 110/63 04/09/23 1353  Temp    Pulse    Resp 23 04/09/23 1358  SpO2    Vitals shown include unfiled device data.  Last Pain:  Vitals:   04/09/23 0718  TempSrc: Oral  PainSc: 0-No pain         Complications: No notable events documented.

## 2023-04-09 NOTE — Anesthesia Procedure Notes (Signed)
 Procedure Name: Intubation Date/Time: 04/09/2023 7:43 AM  Performed by: Bishop Limbo, CRNAPre-anesthesia Checklist: Patient identified, Emergency Drugs available, Suction available and Patient being monitored Patient Re-evaluated:Patient Re-evaluated prior to induction Oxygen Delivery Method: Circle System Utilized Preoxygenation: Pre-oxygenation with 100% oxygen Induction Type: IV induction Ventilation: Mask ventilation without difficulty Laryngoscope Size: Mac and 3 Grade View: Grade I Tube type: Oral Tube size: 7.0 mm Number of attempts: 1 Airway Equipment and Method: Stylet Placement Confirmation: ETT inserted through vocal cords under direct vision, positive ETCO2 and breath sounds checked- equal and bilateral Secured at: 22 cm Tube secured with: Tape Dental Injury: Teeth and Oropharynx as per pre-operative assessment

## 2023-04-09 NOTE — H&P (Signed)
 GYNECOLOGY PRE OP HISTORY & PHYSICAL  Subjective:  Yesenia Harper is a 38 y.o. [phone] with suspected adenomyosis s/p [place] presenting for scheduled TLH/BS/cysto for persistent heavy vaginal bleeding  Pt has hx [name]/VTE on chronic anticoagulation and has been following with Dr. Para March for AUB since June. She has a Nexplanon in place that she likes for contraception and she has completed child bearing. She has tried IUD without success, is s/p [place] on 01/20/23, and an ablation was unable to be performed on 03/22/23 for her persistent bleeding.   Her bleeding is controlled on megace and her anticoagulation has been on hold. In light of this, we discussed definitive management of her bleeding with TLH/BS/cysto.   Abdominal surgery history notable for chole, hiatal hernia repair, and prior CS x 2.   Past Medical History:  Diagnosis Date   Abnormal uterine bleeding (AUB)    Anticoagulant long-term use    xalrelto--- managed by dr ennever/  Maralyn Sago carter NP   Bilateral carpal tunnel syndrome 02/23/2011   Chiari malformation type I [facility])    s/p suboccipital craniectomy w/ chiari decompression 06-30-2021 by neurosurgeon dr d. Samson Frederic  @ [facility]   Chronic pain syndrome    followed by [facility]--- dr c. Marca Ancona;     and myofascial pain of lumbar   Cognitive impairment, mild, so stated 05/23/2014   Congestive heart failure with right heart failure (HCC) 2022   followed by [facility] @ [facility]; right-sidedd;       pt referred to HF clinic when incidental RA pressure of 10 w/ relfux contrast in the hepatic veins was found at time of angioplasty/ stenting left common vein & left external iliac vein in 11/ 2022   DDD (degenerative disc disease), cervical    DDD (degenerative disc disease), lumbosacral    Edema of both lower extremities    GAD (generalized anxiety disorder)    History of DVT of lower extremity    2006 ---  LLE  completed coumadin therapy;    and  recurrent 12/ 2023 LLE non-occlusive thrombus within left common femoral vein   Hypothyroidism    Iron deficiency anemia due to chronic blood loss    May-Thurner syndrome 12/13/2020   DX 11/ 2022;    s/p median arcuate ligament release 04-05-2022   MDD (major depressive disorder)    Median arcuate ligament syndrome (HCC)    s/p  MALS surgical release 04-05-2022   Migraine without aura, intractable, without status migrainosus    neurologist--- mark pane PA;   treated w/ botox injections   Pelvic congestion syndrome 11/2020   s/p  left ovarian vein coil embolization 11-17-2020   Peripheral vascular disease (HCC)    vascular--- dr Charisse Klinefelter (lov in care everywhere 02-23-2022)   s/p  angioplasty/ stenting left common & left external iliac veins w/ overlapping   Scoliosis 12/18/2019   Sjogren syndrome with central nervous system involvement [facility])    rheumatologist--- dr Melford Aase   Past Surgical History:  Procedure Laterality Date   CARDIAC CATHETERIZATION  06/16/2021   @[facility] by dr Freddie Apley;  sob/ cp;  normal cardiac output/ no pulm htn/  normal miven filling pressures/  normal coronaries   CESAREAN SECTION     2006  and  11-15-2014   CRANIECTOMY  06/30/2021   @[facility]  by dr d. Samson Frederic;   intraoperative microscopic microdissection's , decompression of chiari 1 malformation   CYSTOSCOPY W/ RETROGRADES Bilateral 11/27/2022   @[facility]   by dr r. friedman   HIATAL HERNIA REPAIR     HYSTEROSCOPY WITH D & C  03/22/2023   Procedure: DILATATION AND CURETTAGE /HYSTEROSCOPY WITH ATTEMPTED ENDOMETRIAL ABLATION;  Surgeon: Milas Hock, MD;  Location: [facility] OR;  Service: Gynecology;;   IR ANGIOGRAM PELVIS SELECTIVE OR SUPRASELECTIVE  01/29/2023   IR ANGIOGRAM PELVIS SELECTIVE OR SUPRASELECTIVE  01/29/2023   IR ANGIOGRAM SELECTIVE EACH ADDITIONAL VESSEL  01/29/2023   IR EMBO TUMOR ORGAN ISCHEMIA INFARCT INC GUIDE ROADMAPPING  01/29/2023   IR FLUORO GUIDED NEEDLE PLC ASPIRATION/INJECTION LOC   01/29/2023   IR RADIOLOGIST EVAL & MGMT  08/23/2020   IR RADIOLOGIST EVAL & MGMT  05/30/2021   IR RADIOLOGIST EVAL & MGMT  10/26/2021   IR RADIOLOGIST EVAL & MGMT  01/03/2023   IR TRANSCATH PLC STENT  INITIAL VEIN  INC ANGIOPLASTY  11/30/2020   @[facility] --[facility]; VENOGRAPHY,     ANGIOPLASTY STENTING OF LEFT COMMON ILIAC AND LEFT EXTERNAL ILIAC VEINS WITH OVERLAPPING ABRE STENTS   IR US GUIDANCE  11/17/2020   @[facility];    COILING EMBOLIZATION LEFT OVARIN VEIN   IR US GUIDE VASC ACCESS RIGHT  01/29/2023   LAPARASCOPIC MEDICAN ARCUATE LIGAMENT RELEASE  04/05/2022   @ [facility] by dr Rejeana Brock;   AND PARAESOPHAGEAL HERNIA REPAIR   LAPAROSCOPIC CHOLECYSTECTOMY  10/23/2022   @ [facility] by dr j. Ashley Jacobs;   VISCERAL ANGIOGRAPHY N/A 10/16/2021   Procedure: MESENTRIC ANGIOGRAPHY;  Surgeon: Maeola Harman, MD;  Location: [facility] INVASIVE CV LAB;  Service: Cardiovascular;  Laterality: N/A;   No current facility-administered medications on file prior to encounter.   Current Outpatient Medications on File Prior to Encounter  Medication Sig Dispense Refill   acetaminophen (TYLENOL) 500 MG tablet Take 1,000 mg by mouth every 6 (six) hours as needed for mild pain (pain score 1-3) or moderate pain (pain score 4-6).     ALPRAZolam (XANAX) 0.25 MG tablet Take 0.25 mg by mouth 3 (three) times daily as needed for anxiety.     B-D 3CC LUER-LOK SYR 25GX1/2" 25G X 1-1/2" 3 ML MISC Inject 1 each into the muscle once a week.     buPROPion HCl ER, XL, 450 MG TB24 Take 450 mg by mouth in the morning.     cyanocobalamin (VITAMIN B12) 1000 MCG/ML injection Inject 1,000 mcg into the muscle every Wednesday.     diltiazem (TIADYLT ER) 120 MG 24 hr capsule Take 240 mg by mouth in the morning.     etonogestrel (NEXPLANON) 68 MG IMPL implant Inject 1 each into the skin once.     folic acid (FOLVITE) 1 MG tablet Take 1 tablet (1 mg total) by mouth daily. 30 tablet 6   hydroxychloroquine (PLAQUENIL) 200 MG tablet Take  400 mg by mouth daily.     lamoTRIgine (LAMICTAL) 25 MG tablet Take 50 mg by mouth at bedtime.     levothyroxine (SYNTHROID) 75 MCG tablet Take 75 mcg by mouth daily before breakfast.     linaclotide (LINZESS) 290 MCG CAPS capsule Take 290 mcg by mouth daily.     lurasidone (LATUDA) 40 MG TABS tablet Take 40 mg by mouth every evening.     meclizine (ANTIVERT) 25 MG tablet Take 25 mg by mouth 3 (three) times daily as needed for dizziness.     megestrol (MEGACE) 40 MG tablet Take 1 tablet (40 mg total) by mouth 2 (two) times daily. Can increase to two tablets twice a day in the event of heavy bleeding  60 tablet 5   Multiple Vitamin (MULTIVITAMIN ADULT PO) Take 1 tablet by mouth in the morning.     NURTEC 75 MG TBDP Take 75 mg by mouth daily as needed for migraine.     omeprazole (PRILOSEC) 20 MG capsule Take 20 mg by mouth daily as needed.     ondansetron (ZOFRAN-ODT) 8 MG disintegrating tablet TAKE 1 TABLET BY MOUTH EVERY 8 HOURS AS NEEDED FOR NAUSEA 60 tablet 0   promethazine (PHENERGAN) 12.5 MG tablet Take 12.5 mg by mouth every 4 (four) hours as needed for nausea or vomiting.     QULIPTA 60 MG TABS Take 60 mg by mouth in the morning.     spironolactone (ALDACTONE) 25 MG tablet Take 25 mg by mouth in the morning.     Vilazodone HCl (VIIBRYD) 40 MG TABS Take 1 tablet (40 mg total) by mouth daily. 90 tablet 1   Vitamin D, Ergocalciferol, (DRISDOL) 1.25 MG (50000 UNIT) CAPS capsule Take 50,000 Units by mouth every Thursday.     zolpidem (AMBIEN) 10 MG tablet Take 10 mg by mouth at bedtime.     BOTOX 200 units injection Inject 200 Units into the muscle every 3 (three) months. This is done by neurologist --- treatment for migraines     Allergies  Allergen Reactions   Penicillins Itching and Rash    Patient feels itching in mouth.  . OB History     Gravida  2   Para  2   Term  2   Preterm      AB      Living  2      SAB      IAB      Ectopic      Multiple      Live Births              Social History   Socioeconomic History   Marital status: Married    Spouse name: Not on file   Number of children: 2   Years of education: Not on file   Highest education level: Not on file  Occupational History   Not on file  Tobacco Use   Smoking status: Never   Smokeless tobacco: Never  Vaping Use   Vaping status: Never Used  Substance and Sexual Activity   Alcohol use: Not Currently   Drug use: Never   Sexual activity: Yes    Partners: Male    Birth control/protection: Implant  Other Topics Concern   Not on file  Social History Narrative   Not on file   Social Drivers of Health   Financial Resource Strain: Patient Declined (08/12/2022)   Received from [facility]   Overall Financial Resource Strain (CARDIA)    Difficulty of Paying Living Expenses: Patient declined  Food Insecurity: Patient Declined (08/12/2022)   Received [facility]   Hunger Vital Sign    Worried About Running Out of Food in the Last Year: Patient declined    Ran Out of Food in the Last Year: Patient declined  Transportation Needs: Patient Declined (08/12/2022)   Received from [facility] - Transportation    Lack of Transportation (Medical): Patient declined    Lack of Transportation (Non-Medical): Patient declined  Physical Activity: Unknown (08/12/2022)   Received from [facility]   Exercise Vital Sign    Days of Exercise per Week: Patient declined    Minutes of Exercise per Session: Not on file  Stress: Patient Declined (  08/12/2022)   Received from [facility] - Occupational Stress Questionnaire    Feeling of Stress : Patient declined  Social Connections: Unknown (05/08/2021)   Received from [facility]   Social Network    Social Network: Not on file  Intimate Partner Violence: Not At Risk (08/12/2022)   Received from [facility]   HITS    Over the last 12 months how often did your partner  physically hurt you?: Never    Over the last 12 months how often did your partner insult you or talk down to you?: Never    Over the last 12 months how often did your partner threaten you with physical harm?: Never    Over the last 12 months how often did your partner scream or curse at you?: Never   Objective:   Vitals:   04/03/23 1725 04/09/23 0718  BP:  120/77  Pulse:  85  Resp:  16  Temp:  98.2 F (36.8 C)  TempSrc:  Oral  SpO2:  97%  Weight: 74.4 kg 76.7 kg  Height: 5\' 2"  (1.575 m) 5\' 2"  (1.575 m)   General:  Alert, oriented and cooperative. Patient is in no acute distress.  Skin: Skin is warm and dry. No rash noted.   Cardiovascular: Normal heart rate noted  Respiratory: Normal respiratory effort, no problems with respiration noted   Assessment and Plan:  Yesenia Harper is a 38 y.o. with AUB due to adenomyosis/anticoagulation/Nexplanon use presenting for TLH/BS/cysto  - Risks of surgery include but are not limited to:  Bleeding - Can bleed enough to need transfusion or need for additional surgeries I.e. conversation to open surgery.  Infection - The vagina can develop cuff infection Injury to surrounding organs/tissues (i.e. bowel/bladder/ureters) - discussed how each of these is managed I.e. bladder injury requires catheter for 10-14 days after surgery Need for additional procedures - Would be specific to a complication or injury Wound complications - No specific wound unless converted to open surgery or laparoscopic Hospital re-admission - In the event of a delayed complication being recognized I.e. ureteral injury discussed Conversion to open surgery - reviewed some complications may necessitate or it may be the only way to complete the surgery.   VTE - Discussed risk of blood clots following delivery - We discussed alternatives to hysterectomy though she has exhausted her options  - We discussed postop restrictions, precautions and expectations - Consents signed -  ERAS - Pre op heparin - Ancef for pre op abx (low risk of cross reactivity, low risk PCN allergy)  Future Appointments  Date Time Provider Department Center  04/19/2023  1:15 PM [facility] LAB [facility] None  04/19/2023  1:30 PM Erenest Blank, NP [facility] None   Lennart Pall, MD

## 2023-04-09 NOTE — Anesthesia Postprocedure Evaluation (Signed)
 Anesthesia Post Note  Patient: Yesenia Harper  Procedure(s) Performed: HYSTERECTOMY, TOTAL, LAPAROSCOPIC, WITH SALPINGECTOMY LYSIS OF ADHESIONS (Bilateral) CYSTOSCOPY     Patient location during evaluation: PACU Anesthesia Type: General Level of consciousness: awake and alert Pain management: pain level controlled Respiratory status: spontaneous breathing, nonlabored ventilation and respiratory function stable Postop Assessment: no apparent nausea or vomiting Anesthetic complications: no Comments: Patient seen multiple times while in PACU. See intra-op notes for further details. Patient hypotensive immediately after surgery with low-dose phenylephrine restarted in PACU, Almost 3L of LR had been given during case. Additional 500cc albumin given in PACU and CBC with Hgb 10.0 from 13.1 preop. Multiple communications with surgeon updating on status. Phenylephrine continued to be required intermittently to keep MAP>65. CTA ordered by surgeon. Communicated status of patient to call anesthesiologist for further care when patient returns from CT. Stephannie Peters, MD   No notable events documented.             Shanda Howells

## 2023-04-09 NOTE — Progress Notes (Addendum)
 Progress Note  Delayed entry due to patient care.   Notified that patient was having issues with hypotension and pain control around 3pm. STAT Hgb had been drawn and later resulted at 10.0 from 13.1 pre op - somewhat higher than anticipated given blood loss but drawn off of IV. Straight cath UOP since end of case 100cc (~50cc/h). STAT CT A/P angiogram verbally ordered as I was in a different surgical case. Anesthesia continued with IV fluid resuscitation, pressor support and pain management. Intra-op update was reported that patient was improving.   Initially went to see patient after completion of case around 4:30pm as she was leaving for CT. She reported pain was under control and she had normal mentation.   CT A/P w/wout (not angiogram) with scattered pockets of air in subcutaneous soft tissues and anterior pelvic wall with areas of fat contusion or edema/hemorrhage. Overall consistent with expected post surgical changes, no drainable fluid collection or large hematoma.   Repeat H&H 4 hours later with stable Hgb at 10.4.   I returned to evaluate the patient while waiting for results & after CT & Hgb resulted. Initially patient had been off pressor support, but on return after CT/Hgb result she was again requiring pressors. Abdomen soft, non distended, mildly tender but appropriate for POD0. Incisions c/d/I with surgical glue, no visible surrounding hematoma, no notable vaginal bleeding. Foley had been placed with output of 300cc of clear urine. No evidence of ongoing intraabdominal bleeding at this time. Discussed case with anesthesia team. Plan for additional fluid resuscitation.   If able to wean from pressor support, plan for admission to [facility] for postoperative care.  Harvie Bridge, MD Obstetrician & Gynecologist, [facility] for [facility], [facility]   Update 4/1 10:34pm  Called to check in on patient and notified that a rapid response  was being called for hypotension. On arrival to patient's room, she is sitting up and has an LR bolus running. BP 70s/40s. She report pain and not feeling well but is conversant and oriented. She reports pain under her ribs but no significant lower abdominal pain, rectal/vaginal pain, or back pain. On exam, abdomen is soft. Mild diffuse tenderness, no rebound tenderness or guarding. Incisions are still c/d/I with surgical glue, no ecchymosis, no significant vaginal bleeding. No pedal edema. Repeat hemoglobin 8.9, within reason when considering hemodilution.  Foley in place draining clear urine, has put out 1045cc in the last 3 hours.   Consulted IR for review of images and considering of additional imaging. He reviewed her images - notes that it was run with an angiogram protocol. No active extravasation noted. Some free fluid in pelvis within expected amount for POD0. Some bleeding within subcutaneous tissues most notable around right port site, but nothing to explain her hypotension.   Reviewed her case with anesthesia. Based on documented I/Os, suspect that patient may still be hypovolemic if there is no evidence of ongoing bleeding. Recommended additional fluid bolus and additional 500cc of albumin.   A/P: - She does not have evidence of ongoing intraabdominal bleeding at this time - Continue IV fluid resuscitation with additional 500cc of albumin and 1L LR then LR @ 125 cc/h - Strict I/Os - Place abdominal binder - CBC/CMP to be repeated in 6 hours  Harvie Bridge, MD Obstetrician & Gynecologist, [facility] for [employer], [facility]

## 2023-04-09 NOTE — Op Note (Signed)
 Yesenia Harper PROCEDURE DATE: 04/09/2023  PREOPERATIVE DIAGNOSIS: AUB POSTOPERATIVE DIAGNOSIS: The same PROCEDURE: Total laparoscopic hysterectomy, bilateral salpingectomy, cystoscopy SURGEON:  Dr. Harvie Bridge ASSISTANT:  Dr. Catalina Antigua & Dr. Rivka Barbara.  An experienced assistant was required given the standard of surgical care given the complexity of the case.  This assistant was needed for exposure, dissection, suctioning, retraction, instrument exchange, and for overall help during the procedure.  INDICATIONS: 38 y.o. [id number]  here for definitive surgical management secondary to the indications listed under preoperative diagnoses; please see preoperative note for further details.  Risks of surgery were discussed with the patient including but not limited to: bleeding which may require transfusion or reoperation; infection which may require antibiotics; injury to bowel, bladder, ureters or other surrounding organs; need for additional procedures; thromboembolic phenomenon, incisional problems and other postoperative/anesthesia complications. Written informed consent was obtained.    FINDINGS: Uterus adhesive disease to anterior abdominal wall. Dense adhesive disease between bladder and uterus. Normal tubes and ovaries. Coils from prior intervention noted near left ovary. Post op cystoscopy with bladder intact, no foreign body/suture, bilateral ureteral jets.   ANESTHESIA:    General INTRAVENOUS FLUIDS:2600  ml ESTIMATED BLOOD LOSS: 200 ml URINE OUTPUT: 260 ml  SPECIMENS: Uterus, cervix, bilateral fallopian tubes COMPLICATIONS: None immediate. Required intraoperative consult with Dr. Briscoe Deutscher for assistance with dense adhesive disease between bladder and uterus.  PROCEDURE IN DETAIL:  The patient received intravenous antibiotics and had sequential compression devices applied to her lower extremities while in the preoperative area.  She was then taken to the operating room where general  anesthesia was administered and was found to be adequate.  She was placed in the dorsal lithotomy position, and was prepped and draped in a sterile manner.    After an adequate timeout was performed, a Rumi uterine manipulator was placed at this time.  A Foley catheter was inserted into her bladder and attached to constant drainage.   Attention was turned to the abdomen where an umbilical incision was made with the scalpel. The subcutaneous tissue was bluntly dissected until the fascia was reached. The fascia was grasped, elevated and entered sharply. Notable preperitoneal fat was encountered. This was dissected until the peritoneum could be elevated and sharply entered. A 12mm Hasson port was placed into the incision followed by a 0 degree laparoscope. Intraperitoneal entry was confirmed and the abdomen was insufflated. Inspection below the point of entry revealed no evidence of bowel injury. Patient placed in steep trendelenburg. Lateral 5 mm ports were placed.   Adhesive disease noted between the uterus, bladder, and anterior abdominal wall. The bladder was backfilled with 200cc of NS. Using the lateral ports, we determined that adhesive disease at the fundus of the uterus was not connected to the bladder. This was taken down with the Ligasure well away from the dome of the bladder. A 5mm suprapubic port was then able to be inserted while staying well away from the bladder..   Attention was turned to the fallopian tubes; these were freed from the underlying mesosalpinx and the uterine attachments using the Ligasure device.  The bilateral round ligaments were then clamped and transected with the Ligasure device.  The left uteroovarian ligaments were sealed and cut with the Ligasure. The uterine artery was then skeletonized on the left. We were able to identify the cup posterior and anteriorly. The bladder was gently bluntly dissected away from the cup on the left lateral edge and the uterines were secured  on the left.  Attention was turned to the right side. The right uteroovarian was sealed and cut with the Ligasure. Attempted to identify the correct bladder plane on the right but bladder was redundant and initially there was concern we could be de-serosalizing the bladder - we later confirmed this was more peritoneum over scar but not actually near the bladder. We attempted to further develop the plane between the bladder and uterus by bluntly coming along the cervix from the left. At this point Dr. Briscoe Deutscher was called for assistance/intra op consult. She confirmed appropriate placement of foley and uterine manipulator. We drained and then refilled the bladder to 400cc and confirmed there was no spillage of fluid or evidence of injury. We could then see that bladder was well away from scar to the uterus. We further developed the plane between cervix and bladder while taking care to stay right along the cup. We were able to connect laterally on the right side through a window of filmy adhesive disease. The scar tissue now on the instrument was taken down with the L hook Bovie. The bladder was intact and then drained through the Foley. Attention was then turned back to the right side where uterines could be fully skeletonized, sealed and cut with the Ligasure. This was repeated on the left. Bladder again noted to be well away.   Attention was then turned to the cervicovaginal junction, and the bovie hook was used to transect the cervix from the surrounding vagina using the ring of the Rumi as a guide. This was done circumferentially allowing total hysterectomy.  The uterus was then removed from the vagina and the vaginal cuff incision was then closed with 0 V loc in a running fashion. The pelvis was copiously irrigated. A small amount of oozing was noted near the right round and right mesosalpinx near the ovary (but away from the IP). These areas were lifting away from underlying structures and cauterized with  Ligasure. Overall excellent hemostasis was noted. The abdominal pressure was reduced and hemostasis was confirmed.  Arista was applied throughout the pelvis.   Attention was turned to cystoscopy. The cystoscope was inserted through the urethra and bladder was filled. Careful inspection confirmed bladder intact with no suture/foreign body and bilateral ureteral jets. The cystoscope was removed.   I returned to the abdomen. All trocars were removed under direct visualization, and the abdomen was desufflated. The umbilical fascia was closed with 0 vicryl in a running fashion. The subcutaneous tissue of the umbilical incision was reapproximated with 0 vicryl. All skin incisions were closed with 4-0 Vicryl subcuticular stitches and Dermabond. A pressure dressing was applied to the umbilical incision. The patient tolerated the procedures well.    All instruments, needles, and sponge counts were correct x 2. The patient was taken to the recovery room awake, extubated and in stable condition.   Harvie Bridge, MD Obstetrician & Gynecologist, [facility] for [employer], [facility]

## 2023-04-10 ENCOUNTER — Encounter (HOSPITAL_COMMUNITY): Payer: Self-pay | Admitting: Obstetrics and Gynecology

## 2023-04-10 LAB — CBC
HCT: 23.5 % — ABNORMAL LOW (ref 36.0–46.0)
HCT: 30.3 % — ABNORMAL LOW (ref 36.0–46.0)
Hemoglobin: 7.9 g/dL — ABNORMAL LOW (ref 12.0–15.0)
Hemoglobin: 10.3 g/dL — ABNORMAL LOW (ref 12.0–15.0)
MCH: 30.6 pg (ref 26.0–34.0)
MCH: 30.4 pg (ref 26.0–34.0)
MCHC: 33.6 g/dL (ref 30.0–36.0)
MCHC: 34 g/dL (ref 30.0–36.0)
MCV: 91.1 fL (ref 80.0–100.0)
MCV: 89.4 fL (ref 80.0–100.0)
Platelets: 211 10*3/uL (ref 150–400)
Platelets: 202 10*3/uL (ref 150–400)
RBC: 2.58 MIL/uL — ABNORMAL LOW (ref 3.87–5.11)
RBC: 3.39 MIL/uL — ABNORMAL LOW (ref 3.87–5.11)
RDW: 13.2 % (ref 11.5–15.5)
RDW: 15 % (ref 11.5–15.5)
WBC: 8.9 10*3/uL (ref 4.0–10.5)
WBC: 8.2 10*3/uL (ref 4.0–10.5)
nRBC: 0 % (ref 0.0–0.2)
nRBC: 0 % (ref 0.0–0.2)

## 2023-04-10 LAB — COMPREHENSIVE METABOLIC PANEL WITH GFR
ALT: 22 U/L (ref 0–44)
AST: 16 U/L (ref 15–41)
Albumin: 3.7 g/dL (ref 3.5–5.0)
Alkaline Phosphatase: 47 U/L (ref 38–126)
Anion gap: 6 (ref 5–15)
BUN: 5 mg/dL — ABNORMAL LOW (ref 6–20)
CO2: 26 mmol/L (ref 22–32)
Calcium: 8.8 mg/dL — ABNORMAL LOW (ref 8.9–10.3)
Chloride: 107 mmol/L (ref 98–111)
Creatinine, Ser: 0.73 mg/dL (ref 0.44–1.00)
GFR, Estimated: >60 mL/min (ref >60)
Glucose, Bld: 125 mg/dL — ABNORMAL HIGH (ref 70–99)
Potassium: 3.8 mmol/L (ref 3.5–5.1)
Sodium: 139 mmol/L (ref 135–145)
Total Bilirubin: 0.4 mg/dL (ref 0.0–1.2)
Total Protein: 5.4 g/dL — ABNORMAL LOW (ref 6.5–8.1)

## 2023-04-10 LAB — SURGICAL PATHOLOGY

## 2023-04-10 LAB — PREPARE RBC (CROSSMATCH)

## 2023-04-10 MED ORDER — LACTATED RINGERS IV SOLN: INTRAVENOUS | Status: DC

## 2023-04-10 MED ORDER — SODIUM CHLORIDE 0.9% IV SOLUTION: Freq: Once | INTRAVENOUS | Status: AC

## 2023-04-10 MED ORDER — LACTATED RINGERS IV BOLUS
500.0000 mL | Freq: Once | INTRAVENOUS | Status: AC
  Administered 2023-04-10: 500 mL via INTRAVENOUS

## 2023-04-10 MED ORDER — SIMETHICONE 80 MG PO CHEW
80.0000 mg | CHEWABLE_TABLET | Freq: Four times a day (QID) | ORAL | Status: DC | PRN
  Administered 2023-04-10: 80 mg via ORAL
  Filled 2023-04-10: qty 1

## 2023-04-10 NOTE — Progress Notes (Addendum)
 [facility] Gynecology Progress Note  Yesenia Harper is a 38 y.o. POD1 s/p TLH/BS/cysto with post op course c/b hypotension a/f post op care  Doing better this morning. Notes more abdominal pain but significantly improved with 1 dose of dilaudid at 4:30a. No longer feeling dizzy. Was able to tolerate PO fluids & graham crackers overnight.   PHYSICAL EXAM: Blood pressure (!) 100/56, pulse 96, temperature 98.7 F (37.1 C), temperature source Oral, resp. rate 16, height 5\' 2"  (1.575 m), weight 76.7 kg, SpO2 96%. CONSTITUTIONAL: Alert, oriented, conversant, mild pallor CARDIOVASCULAR: Normal heart rate noted, regular rhythm RESPIRATORY: Effort normal, no problems with respiration noted ABDOMEN: Binder removed. Abdomen soft. Umbilical dressing c/d/I. LLQ, RLQ, & suprapubic lap sites with surgical glue in place, c/d & no drainage. Ecchymosis forming around RLQ lap site and suprapubic lap site. MUSCULOSKELETAL: No significant peripheral edema  Labs: Results for orders placed or performed during the hospital encounter of 04/09/23 (from the past 2 weeks)  CBC   Collection Time: 04/09/23  7:08 AM  Result Value Ref Range   WBC 7.0 4.0 - 10.5 K/uL   RBC 4.25 3.87 - 5.11 MIL/uL   Hemoglobin 13.1 12.0 - 15.0 g/dL   HCT 09.8 11.9 - 14.7 %   MCV 91.8 80.0 - 100.0 fL   MCH 30.8 26.0 - 34.0 pg   MCHC 33.6 30.0 - 36.0 g/dL   RDW 82.9 56.2 - 13.0 %   Platelets 313 150 - 400 K/uL   nRBC 0.0 0.0 - 0.2 %  Basic metabolic panel per protocol   Collection Time: 04/09/23  7:08 AM  Result Value Ref Range   Sodium 140 135 - 145 mmol/L   Potassium 3.5 3.5 - 5.1 mmol/L   Chloride 108 98 - 111 mmol/L   CO2 24 22 - 32 mmol/L   Glucose, Bld 78 70 - 99 mg/dL   BUN 14 6 - 20 mg/dL   Creatinine, Ser 8.65 0.44 - 1.00 mg/dL   Calcium 9.3 8.9 - 78.4 mg/dL   GFR, Estimated >69 >62 mL/min   Anion gap 8 5 - 15  Type and screen   Collection Time: 04/09/23  7:08 AM  Result Value Ref Range   ABO/RH(D) O POS     Antibody Screen NEG    Sample Expiration      04/12/2023,2359 Performed at [facility], [address], [place], [place] [address]   CBC   Collection Time: 04/09/23  2:50 PM  Result Value Ref Range   WBC 21.2 (H) 4.0 - 10.5 K/uL   RBC 3.27 (L) 3.87 - 5.11 MIL/uL   Hemoglobin 10.0 (L) 12.0 - 15.0 g/dL   HCT 13.2 (L) 44.0 - 10.2 %   MCV 93.3 80.0 - 100.0 fL   MCH 30.6 26.0 - 34.0 pg   MCHC 32.8 30.0 - 36.0 g/dL   RDW 72.5 36.6 - 44.0 %   Platelets 278 150 - 400 K/uL   nRBC 0.0 0.0 - 0.2 %  Hemoglobin and hematocrit, blood   Collection Time: 04/09/23  6:12 PM  Result Value Ref Range   Hemoglobin 10.4 (L) 12.0 - 15.0 g/dL   HCT 34.7 (L) 42.5 - 95.6 %  CBC   Collection Time: 04/09/23  9:09 PM  Result Value Ref Range   WBC 12.7 (H) 4.0 - 10.5 K/uL   RBC 2.94 (L) 3.87 - 5.11 MIL/uL   Hemoglobin 8.9 (L) 12.0 - 15.0 g/dL   HCT 38.7 (L) 56.4 -  46.0 %   MCV 91.2 80.0 - 100.0 fL   MCH 30.3 26.0 - 34.0 pg   MCHC 33.2 30.0 - 36.0 g/dL   RDW 16.1 09.6 - 04.5 %   Platelets 246 150 - 400 K/uL   nRBC 0.0 0.0 - 0.2 %  CBC   Collection Time: 04/10/23  4:20 AM  Result Value Ref Range   WBC 8.9 4.0 - 10.5 K/uL   RBC 2.58 (L) 3.87 - 5.11 MIL/uL   Hemoglobin 7.9 (L) 12.0 - 15.0 g/dL   HCT 40.9 (L) 81.1 - 91.4 %   MCV 91.1 80.0 - 100.0 fL   MCH 30.6 26.0 - 34.0 pg   MCHC 33.6 30.0 - 36.0 g/dL   RDW 78.2 95.6 - 21.3 %   Platelets 211 150 - 400 K/uL   nRBC 0.0 0.0 - 0.2 %  Comprehensive metabolic panel   Collection Time: 04/10/23  4:20 AM  Result Value Ref Range   Sodium 139 135 - 145 mmol/L   Potassium 3.8 3.5 - 5.1 mmol/L   Chloride 107 98 - 111 mmol/L   CO2 26 22 - 32 mmol/L   Glucose, Bld 125 (H) 70 - 99 mg/dL   BUN 5 (L) 6 - 20 mg/dL   Creatinine, Ser 0.86 0.44 - 1.00 mg/dL   Calcium 8.8 (L) 8.9 - 10.3 mg/dL   Total Protein 5.4 (L) 6.5 - 8.1 g/dL   Albumin 3.7 3.5 - 5.0 g/dL   AST 16 15 - 41 U/L   ALT 22 0 - 44 U/L   Alkaline Phosphatase 47 38 - 126 U/L   Total  Bilirubin 0.4 0.0 - 1.2 mg/dL   GFR, Estimated >57 >84 mL/min   Anion gap 6 5 - 15    Imaging Studies: CT ABDOMEN PELVIS W WO CONTRAST Result Date: 04/09/2023 CLINICAL DATA:  Abdominal pain. Status post bilateral uterine artery embolization. EXAM: CT ABDOMEN AND PELVIS WITHOUT AND WITH CONTRAST TECHNIQUE: Multidetector CT imaging of the abdomen and pelvis was performed following the standard protocol before and following the bolus administration of intravenous contrast. RADIATION DOSE REDUCTION: This exam was performed according to the departmental dose-optimization program which includes automated exposure control, adjustment of the mA and/or kV according to patient size and/or use of iterative reconstruction technique. CONTRAST:  OMNIPAQUE IOHEXOL 350 MG/ML SOLN COMPARISON:  CT abdomen pelvis dated 06/28/2021. FINDINGS: Lower chest: Bibasilar subpleural atelectasis. Small ascites. Scattered pockets of air in the extraperitoneal pelvis likely related to recent procedure. Hepatobiliary: The liver is unremarkable. No acute hypertension. Cholecystectomy. Pancreas: Unremarkable. No pancreatic ductal dilatation or surrounding inflammatory changes. Spleen: Normal in size without focal abnormality. Adrenals/Urinary Tract: The adrenal glands are unremarkable. There is no hydronephrosis or nephrolithiasis on either side. There is symmetric enhancement of the kidneys. The visualized ureters and urinary bladder appear unremarkable. Stomach/Bowel: There is no bowel obstruction or active inflammation. The appendix is normal. Vascular/Lymphatic: The abdominal aorta and IVC unremarkable. Left common and external iliac vein stent appears patent for the degree of opacification. No portal venous gas. Left gonadal vein coil embolization with associated streak artifact. No adenopathy. Reproductive: Hysterectomy.  No suspicious adnexal masses. Other: Scattered pockets of air in the subcutaneous soft tissues of the anterior  pelvic wall and areas of fat contusion or edema/hemorrhage. No fluid collection or large hematoma. Musculoskeletal: No acute osseous pathology. IMPRESSION: 1. Postsurgical changes of hysterectomy with small ascites and pockets of air in the pelvis. No drainable fluid collection or abscess. No  large hematoma. 2. No bowel obstruction. Normal appendix. 3. Left common and external iliac vein stent appears patent. Electronically Signed   By: Elgie Collard M.D.   On: 04/09/2023 18:27   Assessment: Principal Problem:   S/P laparoscopic hysterectomy Active Problems:   Dysfunctional uterine bleeding 38yo POD1 s/p TLH/BS/cysto, improving Suspect Hgb drop is related to subcutaneous hematoma formation, possible rectus hematoma formation as well as hypovolemia. She responded well to fluid bolus overnight and BP is more appropriate in 100s/50-60s. She has had excellent urine output. Her abdominal exam is appropriate for POD1 without evidence of peritoneal signs (soft, no rebound/guarding). Reviewed with patient & her husband my concerns/suspicions related to her hypotension as well as the plan outlined below. Given location of suspected hematoma formation, would not recommend re operation due to concerns that we will not find an actively bleeding vessel and could potentially worsen her bleeding/blood pressure with further surgical intervention. We discussed potential need for transfusion if Hgb < 7 or she again has symptomatic hypotension.  Plan: Continue LR @ 125cc/h Serial CBC, next at 12p 2u pRBCs on hold Holding prophylactic anticoagulation until Hgb stabilizes, SCDs in place Continue current pain regimen OOB this AM/as tolerated, regular diet Potential dc of foley catheter pending pt's course this AM  UPDATE: Delayed entry d/t patient care. Received call that patient's BP returned to 80s/50s about 1 hour after my evaluation (~8am). Pt reported headache and fatigue but RN reports abdominal exam is  unchanged and pt continues to have good UOP. Will transfuse 2u pRBCs and follow up post transfusion CBC.   UPDATE: Returned to evaluate patient at 5:45pm. Post transfusion CBC with appropriate rise in Hgb to 10.3 (from 7.9). BP improved initially to 100s/50-60s, but on arrival pt was 80s/40s. Continues to be alert & oriented, conversant. Looks fatigued but reports she is feeling better compared with yesterday. She was able to sit up at the bedside with BP 90s/40-60s. Initially felt dizzy but improved after sitting for 3-5 minutes. Able to stand for 2-3 minutes but felt dizzy and BP 80s/27. Pt sat down and dizziness improved.  Abdominal exam continues to be stable. She has ecchymosis around her RLQ and suprapubic port sites and tenderness throughout her abdomen without rebound or guarding. Soft, no significant distension. + bowel sounds.  Mild non pitting pedal edema noted bilaterally, upper airway sounds noted on pulm exam but otherwise normal breath sounds bilaterally.  Foley in place with clear urine, UOP adequate throughout the day  Plan for repeat CBC/CMP in AM Maintenance IVF @ 100cc/h If continued hypotension or significant drop in Hgb in AM, plan for repeat CT angio of abdomen/pelvis.  Continue holding anticoagulation, SCDs in place  Harvie Bridge, MD Obstetrician & Gynecologist, [facility] for [employer], [facility]

## 2023-04-11 DIAGNOSIS — F4312 Post-traumatic stress disorder, chronic: Secondary | ICD-10-CM | POA: Diagnosis not present

## 2023-04-11 DIAGNOSIS — F3131 Bipolar disorder, current episode depressed, mild: Secondary | ICD-10-CM | POA: Diagnosis not present

## 2023-04-11 DIAGNOSIS — F411 Generalized anxiety disorder: Secondary | ICD-10-CM | POA: Diagnosis not present

## 2023-04-11 LAB — COMPREHENSIVE METABOLIC PANEL WITH GFR
ALT: 18 U/L (ref 0–44)
AST: 19 U/L (ref 15–41)
Albumin: 3.6 g/dL (ref 3.5–5.0)
Alkaline Phosphatase: 55 U/L (ref 38–126)
Anion gap: 7 (ref 5–15)
BUN: 10 mg/dL (ref 6–20)
CO2: 26 mmol/L (ref 22–32)
Calcium: 8.6 mg/dL — ABNORMAL LOW (ref 8.9–10.3)
Chloride: 106 mmol/L (ref 98–111)
Creatinine, Ser: 0.8 mg/dL (ref 0.44–1.00)
GFR, Estimated: >60 mL/min (ref >60)
Glucose, Bld: 87 mg/dL (ref 70–99)
Potassium: 3.8 mmol/L (ref 3.5–5.1)
Sodium: 139 mmol/L (ref 135–145)
Total Bilirubin: 0.5 mg/dL (ref 0.0–1.2)
Total Protein: 5.5 g/dL — ABNORMAL LOW (ref 6.5–8.1)

## 2023-04-11 LAB — CBC
HCT: 31.6 % — ABNORMAL LOW (ref 36.0–46.0)
Hemoglobin: 10.6 g/dL — ABNORMAL LOW (ref 12.0–15.0)
MCH: 30.3 pg (ref 26.0–34.0)
MCHC: 33.5 g/dL (ref 30.0–36.0)
MCV: 90.3 fL (ref 80.0–100.0)
Platelets: 239 10*3/uL (ref 150–400)
RBC: 3.5 MIL/uL — ABNORMAL LOW (ref 3.87–5.11)
RDW: 15.4 % (ref 11.5–15.5)
WBC: 10 10*3/uL (ref 4.0–10.5)
nRBC: 0 % (ref 0.0–0.2)

## 2023-04-11 LAB — TYPE AND SCREEN
ABO/RH(D): O POS
Antibody Screen: NEGATIVE
Unit division: 0
Unit division: 0

## 2023-04-11 LAB — BPAM RBC
Blood Product Expiration Date: 202504152359
Blood Product Expiration Date: 202504282359
ISSUE DATE / TIME: 202504020818
ISSUE DATE / TIME: 202504021128
Unit Type and Rh: 5100
Unit Type and Rh: 5100

## 2023-04-11 NOTE — Progress Notes (Signed)
 Faculty Practice Gynecology Progress Note  Yesenia Harper is a 38 y.o. POD2 s/p TLH/BS/cysto with post op course c/b hypotension a/f post op care  Doing better this morning. Still some dizziness with position changes but overall feeling better. Pain is controlled with oral medications. Tolerating PO. Foley is in place. Needed O2 overnight but now 100% on 1L.  PHYSICAL EXAM: Blood pressure (!) 98/42, pulse 84, temperature 97.9 F (36.6 C), temperature source Oral, resp. rate 17, height 5\' 2"  (1.575 m), weight 76.7 kg, SpO2 99%. CONSTITUTIONAL: Alert, oriented, conversant, mild pallor CARDIOVASCULAR: Normal heart rate noted, regular rhythm RESPIRATORY: Effort normal, no problems with respiration noted ABDOMEN: Binder removed. Abdomen soft. Umbilical dressing c/d/I. LLQ, RLQ, & suprapubic lap sites with surgical glue in place, c/d & no drainage. Ecchymosis present around RLQ lap site MUSCULOSKELETAL: No significant peripheral edema  Labs: Results for orders placed or performed during the hospital encounter of 04/09/23 (from the past 2 weeks)  CBC   Collection Time: 04/09/23  7:08 AM  Result Value Ref Range   WBC 7.0 4.0 - 10.5 K/uL   RBC 4.25 3.87 - 5.11 MIL/uL   Hemoglobin 13.1 12.0 - 15.0 g/dL   HCT 16.1 09.6 - 04.5 %   MCV 91.8 80.0 - 100.0 fL   MCH 30.8 26.0 - 34.0 pg   MCHC 33.6 30.0 - 36.0 g/dL   RDW 40.9 81.1 - 91.4 %   Platelets 313 150 - 400 K/uL   nRBC 0.0 0.0 - 0.2 %  Basic metabolic panel per protocol   Collection Time: 04/09/23  7:08 AM  Result Value Ref Range   Sodium 140 135 - 145 mmol/L   Potassium 3.5 3.5 - 5.1 mmol/L   Chloride 108 98 - 111 mmol/L   CO2 24 22 - 32 mmol/L   Glucose, Bld 78 70 - 99 mg/dL   BUN 14 6 - 20 mg/dL   Creatinine, Ser 7.82 0.44 - 1.00 mg/dL   Calcium 9.3 8.9 - 95.6 mg/dL   GFR, Estimated >21 >30 mL/min   Anion gap 8 5 - 15  Type and screen   Collection Time: 04/09/23  7:08 AM  Result Value Ref Range   ABO/RH(D) O POS    Antibody  Screen NEG    Sample Expiration 04/12/2023,2359    Unit Number [id number]    Blood Component Type RED CELLS,LR    Unit division 00    Status of Unit ISSUED    Transfusion Status OK TO TRANSFUSE    Crossmatch Result Compatible    Unit Number [id number]    Blood Component Type RED CELLS,LR    Unit division 00    Status of Unit ISSUED    Transfusion Status OK TO TRANSFUSE    Crossmatch Result      Compatible Performed at [facility], [address], [place], [place] [address]   [facility]   Collection Time: 04/09/23  7:08 AM  Result Value Ref Range   ISSUE DATE / TIME 403474259563    Blood Product Unit Number [id number]    PRODUCT CODE [id number]    Unit Type and Rh 5100    Blood Product Expiration Date 160109323557    ISSUE DATE / TIME 322025427062    Blood Product Unit Number [id number]    PRODUCT CODE [id number]    Unit Type and Rh 5100    Blood Product Expiration Date [id number]   Surgical pathology   Collection Time: 04/09/23 11:52 AM  Result Value  Ref Range   SURGICAL PATHOLOGY      SURGICAL PATHOLOGY CASE: [id number] PATIENT: Aaleah Dosch Surgical Pathology Report     Clinical History: AUB (cm)     FINAL MICROSCOPIC DIAGNOSIS:  A. UTERUS AND CERVIX, WITH BILATERAL FALLOPIAN TUBES, HYSTERECTOMY: Cervix     Nabothian cysts     Negative for dysplasia Endometrium     Inactive endometrium.     Negative for endometrial intraepithelial neoplasia (EIN) and malignancy. Myometrium     Intravascular material most consistent with embolization     Negative for malignancy Bilateral fallopian tubes     Unremarkable     Negative for endometriosis and malignancy   GROSS DESCRIPTION: Specimen: Received fresh is "uterus cervix and bilateral tubes" Specimen integrity: Intact, fallopian tubes received detached Size and shape: The uterine body has a normal anatomic shape, measuring 5.2 x 4.6 x 3.5 cm. Weight: Uterus and cervix weigh 62  g Serosa: Pink-tan and smooth with a minimal amount of attached adhesion on the anterior aspect, accounting  for 10% of the surface Cervix: 4.0 cm in length by 3.0 cm in diameter with a 0.6 cm slit shaped external os.  The ectocervical mucosa is pink-tan, smooth, and glistening.  The stroma is minimally roughened, but otherwise intact. Sectioning reveals an underlying 1.2 cm cystic structure containing cloudy, gelatinous material with a smooth inner wall that has a thickness of 0.1 cm.  No further distinct lesions are grossly identified. Endometrium: The endometrium is pink-tan and glistening, without distinct lesions, and with a thickness of 0.1 cm. Myometrium: The myometrium is pink-tan, without grossly distinct lesions, and with a thickness of 1.3 cm. Fallopian tubes: There are 2 portions of detached pink-tan tubular tissue consistent with fallopian tubes that include attached fimbria, measuring 5.5 and 4.8 cm in length, each measuring 0.4 cm in diameter. Sectioning reveals patent lumens, without distinct lesions Block Summary: A1 anterior cervix A2 anterior lower uterine  segment, with serosal adhesions A3 full-thickness anterior endomyometrium, with serosal adhesions A4 posterior cervix A5 posterior lower uterine segment A6 full-thickness posterior endomyometrium A7 longer tube A8 shorter tube (KW, 04/09/2023)  Final Diagnosis performed by Jimmy Picket, MD.   Electronically signed 04/10/2023 Technical and / or Professional components performed at [facility]. [facility], [address] [place], [place] [address].  Immunohistochemistry Technical component (if applicable) was performed at [facility]. [address], [place], [place] [address].   IMMUNOHISTOCHEMISTRY DISCLAIMER (if applicable): Some of these immunohistochemical stains may have been developed and the performance characteristics determine by [facility]. Some may not  have been cleared or approved by the U.S. Food and Drug Administration. The FDA has determined that such clearance or approval is not necessary. This test is use d for clinical purposes. It should not be regarded as investigational or for research. This laboratory is certified under the Clinical Laboratory Improvement Amendments of 1988 (CLIA-88) as qualified to perform high complexity clinical laboratory testing.  The controls stained appropriately.   IHC stains are performed on formalin fixed, paraffin embedded tissue using a 3,3"diaminobenzidine (DAB) chromogen and Leica Bond Autostainer System. The staining intensity of the nucleus is score manually and is reported as the percentage of tumor cell nuclei demonstrating specific nuclear staining. The specimens are fixed in 10% Neutral Formalin for at least 6 hours and up to 72hrs. These tests are validated on decalcified tissue. Results should be interpreted with caution given the possibility of false negative results on decalcified specimens. Antibody  Clones are as follows ER-clone 65F, PR-clone 16, Ki67- clone MM1. Some of these immunohistochemical stains may have been developed  and the performance characteristics determined by [facility].   CBC   Collection Time: 04/09/23  2:50 PM  Result Value Ref Range   WBC 21.2 (H) 4.0 - 10.5 K/uL   RBC 3.27 (L) 3.87 - 5.11 MIL/uL   Hemoglobin 10.0 (L) 12.0 - 15.0 g/dL   HCT 75.6 (L) 43.3 - 29.5 %   MCV 93.3 80.0 - 100.0 fL   MCH 30.6 26.0 - 34.0 pg   MCHC 32.8 30.0 - 36.0 g/dL   RDW 18.8 41.6 - 60.6 %   Platelets 278 150 - 400 K/uL   nRBC 0.0 0.0 - 0.2 %  Hemoglobin and hematocrit, blood   Collection Time: 04/09/23  6:12 PM  Result Value Ref Range   Hemoglobin 10.4 (L) 12.0 - 15.0 g/dL   HCT 30.1 (L) 60.1 - 09.3 %  CBC   Collection Time: 04/09/23  9:09 PM  Result Value Ref Range   WBC 12.7 (H) 4.0 - 10.5 K/uL   RBC 2.94 (L) 3.87 - 5.11 MIL/uL   Hemoglobin 8.9 (L) 12.0 -  15.0 g/dL   HCT 23.5 (L) 57.3 - 22.0 %   MCV 91.2 80.0 - 100.0 fL   MCH 30.3 26.0 - 34.0 pg   MCHC 33.2 30.0 - 36.0 g/dL   RDW 25.4 27.0 - 62.3 %   Platelets 246 150 - 400 K/uL   nRBC 0.0 0.0 - 0.2 %  CBC   Collection Time: 04/10/23  4:20 AM  Result Value Ref Range   WBC 8.9 4.0 - 10.5 K/uL   RBC 2.58 (L) 3.87 - 5.11 MIL/uL   Hemoglobin 7.9 (L) 12.0 - 15.0 g/dL   HCT 76.2 (L) 83.1 - 51.7 %   MCV 91.1 80.0 - 100.0 fL   MCH 30.6 26.0 - 34.0 pg   MCHC 33.6 30.0 - 36.0 g/dL   RDW 61.6 07.3 - 71.0 %   Platelets 211 150 - 400 K/uL   nRBC 0.0 0.0 - 0.2 %  Comprehensive metabolic panel   Collection Time: 04/10/23  4:20 AM  Result Value Ref Range   Sodium 139 135 - 145 mmol/L   Potassium 3.8 3.5 - 5.1 mmol/L   Chloride 107 98 - 111 mmol/L   CO2 26 22 - 32 mmol/L   Glucose, Bld 125 (H) 70 - 99 mg/dL   BUN 5 (L) 6 - 20 mg/dL   Creatinine, Ser 6.26 0.44 - 1.00 mg/dL   Calcium 8.8 (L) 8.9 - 10.3 mg/dL   Total Protein 5.4 (L) 6.5 - 8.1 g/dL   Albumin 3.7 3.5 - 5.0 g/dL   AST 16 15 - 41 U/L   ALT 22 0 - 44 U/L   Alkaline Phosphatase 47 38 - 126 U/L   Total Bilirubin 0.4 0.0 - 1.2 mg/dL   GFR, Estimated >94 >85 mL/min   Anion gap 6 5 - 15  Prepare RBC (crossmatch)   Collection Time: 04/10/23  7:19 AM  Result Value Ref Range   Order Confirmation      ORDER PROCESSED BY BLOOD BANK Performed at [facility], [address], [place], [place] [address]   CBC   Collection Time: 04/10/23  3:29 PM  Result Value Ref Range   WBC 8.2 4.0 - 10.5 K/uL   RBC 3.39 (L) 3.87 - 5.11 MIL/uL   Hemoglobin 10.3 (L) 12.0 -  15.0 g/dL   HCT 81.1 (L) 91.4 - 78.2 %   MCV 89.4 80.0 - 100.0 fL   MCH 30.4 26.0 - 34.0 pg   MCHC 34.0 30.0 - 36.0 g/dL   RDW 95.6 21.3 - 08.6 %   Platelets 202 150 - 400 K/uL   nRBC 0.0 0.0 - 0.2 %  CBC   Collection Time: 04/11/23  6:47 AM  Result Value Ref Range   WBC 10.0 4.0 - 10.5 K/uL   RBC 3.50 (L) 3.87 - 5.11 MIL/uL   Hemoglobin 10.6 (L) 12.0 - 15.0 g/dL    HCT 57.8 (L) 46.9 - 46.0 %   MCV 90.3 80.0 - 100.0 fL   MCH 30.3 26.0 - 34.0 pg   MCHC 33.5 30.0 - 36.0 g/dL   RDW 62.9 52.8 - 41.3 %   Platelets 239 150 - 400 K/uL   nRBC 0.0 0.0 - 0.2 %  Comprehensive metabolic panel   Collection Time: 04/11/23  6:47 AM  Result Value Ref Range   Sodium 139 135 - 145 mmol/L   Potassium 3.8 3.5 - 5.1 mmol/L   Chloride 106 98 - 111 mmol/L   CO2 26 22 - 32 mmol/L   Glucose, Bld 87 70 - 99 mg/dL   BUN 10 6 - 20 mg/dL   Creatinine, Ser 2.44 0.44 - 1.00 mg/dL   Calcium 8.6 (L) 8.9 - 10.3 mg/dL   Total Protein 5.5 (L) 6.5 - 8.1 g/dL   Albumin 3.6 3.5 - 5.0 g/dL   AST 19 15 - 41 U/L   ALT 18 0 - 44 U/L   Alkaline Phosphatase 55 38 - 126 U/L   Total Bilirubin 0.5 0.0 - 1.2 mg/dL   GFR, Estimated >01 >02 mL/min   Anion gap 7 5 - 15    Imaging Studies: CT ABDOMEN PELVIS W WO CONTRAST Result Date: 04/09/2023 CLINICAL DATA:  Abdominal pain. Status post bilateral uterine artery embolization. EXAM: CT ABDOMEN AND PELVIS WITHOUT AND WITH CONTRAST TECHNIQUE: Multidetector CT imaging of the abdomen and pelvis was performed following the standard protocol before and following the bolus administration of intravenous contrast. RADIATION DOSE REDUCTION: This exam was performed according to the departmental dose-optimization program which includes automated exposure control, adjustment of the mA and/or kV according to patient size and/or use of iterative reconstruction technique. CONTRAST:  OMNIPAQUE IOHEXOL 350 MG/ML SOLN COMPARISON:  CT abdomen pelvis dated 06/28/2021. FINDINGS: Lower chest: Bibasilar subpleural atelectasis. Small ascites. Scattered pockets of air in the extraperitoneal pelvis likely related to recent procedure. Hepatobiliary: The liver is unremarkable. No acute hypertension. Cholecystectomy. Pancreas: Unremarkable. No pancreatic ductal dilatation or surrounding inflammatory changes. Spleen: Normal in size without focal abnormality. Adrenals/Urinary  Tract: The adrenal glands are unremarkable. There is no hydronephrosis or nephrolithiasis on either side. There is symmetric enhancement of the kidneys. The visualized ureters and urinary bladder appear unremarkable. Stomach/Bowel: There is no bowel obstruction or active inflammation. The appendix is normal. Vascular/Lymphatic: The abdominal aorta and IVC unremarkable. Left common and external iliac vein stent appears patent for the degree of opacification. No portal venous gas. Left gonadal vein coil embolization with associated streak artifact. No adenopathy. Reproductive: Hysterectomy.  No suspicious adnexal masses. Other: Scattered pockets of air in the subcutaneous soft tissues of the anterior pelvic wall and areas of fat contusion or edema/hemorrhage. No fluid collection or large hematoma. Musculoskeletal: No acute osseous pathology. IMPRESSION: 1. Postsurgical changes of hysterectomy with small ascites and pockets of air in the pelvis.  No drainable fluid collection or abscess. No large hematoma. 2. No bowel obstruction. Normal appendix. 3. Left common and external iliac vein stent appears patent. Electronically Signed   By: Elgie Collard M.D.   On: 04/09/2023 18:27   Assessment: Principal Problem:   S/P laparoscopic hysterectomy Active Problems:   Dysfunctional uterine bleeding 38yo POD1 s/p TLH/BS/cysto, improving Suspect Hgb drop was related to subcutaneous hematoma formation, possible rectus hematoma formation as well as hypovolemia.   Plan: D/c IVF Recheck CBC tomorrow S/p 2 units PRBC - hgb stable over night from post-transfusion hgb.  Holding prophylactic anticoagulation until Hgb stabilizes, SCDs in place. Anticipate resuming it tomorrow  Continue current pain regimen OOB this AM/as tolerated, regular diet D/C foley Continue Incentive spirometer, d/c O2 Anticipate D/C tomorrow if continues current recovery trajectory.  Milas Hock, MD Obstetrician & Gynecologist, [facility] for [facility], [facility]

## 2023-04-12 ENCOUNTER — Other Ambulatory Visit (HOSPITAL_COMMUNITY): Payer: Self-pay

## 2023-04-12 ENCOUNTER — Encounter: Payer: Self-pay | Admitting: Family

## 2023-04-12 LAB — CBC
HCT: 31.2 % — ABNORMAL LOW (ref 36.0–46.0)
Hemoglobin: 10.5 g/dL — ABNORMAL LOW (ref 12.0–15.0)
MCH: 30.3 pg (ref 26.0–34.0)
MCHC: 33.7 g/dL (ref 30.0–36.0)
MCV: 89.9 fL (ref 80.0–100.0)
Platelets: 243 10*3/uL (ref 150–400)
RBC: 3.47 MIL/uL — ABNORMAL LOW (ref 3.87–5.11)
RDW: 14.8 % (ref 11.5–15.5)
WBC: 8.5 10*3/uL (ref 4.0–10.5)
nRBC: 0 % (ref 0.0–0.2)

## 2023-04-12 MED ORDER — ACETAMINOPHEN 500 MG PO TABS
1000.0000 mg | ORAL_TABLET | Freq: Four times a day (QID) | ORAL | 0 refills | Status: AC
  Filled 2023-04-12: qty 30, 4d supply, fill #0

## 2023-04-12 MED ORDER — OXYCODONE HCL 5 MG PO TABS
5.0000 mg | ORAL_TABLET | ORAL | 0 refills | Status: DC | PRN
  Filled 2023-04-12: qty 20, 2d supply, fill #0

## 2023-04-12 MED ORDER — DILTIAZEM HCL ER COATED BEADS 120 MG PO CP24
120.0000 mg | ORAL_CAPSULE | Freq: Every morning | ORAL | 0 refills | Status: DC
  Filled 2023-04-12: qty 14, 14d supply, fill #0

## 2023-04-12 MED ORDER — RIVAROXABAN 20 MG PO TABS
20.0000 mg | ORAL_TABLET | Freq: Every day | ORAL | Status: DC
  Filled 2023-04-12: qty 1

## 2023-04-12 MED ORDER — IBUPROFEN 600 MG PO TABS
600.0000 mg | ORAL_TABLET | Freq: Four times a day (QID) | ORAL | 0 refills | Status: AC
  Filled 2023-04-12: qty 30, 8d supply, fill #0

## 2023-04-12 NOTE — Discharge Summary (Signed)
 Gynecology Physician Postoperative Discharge Summary  Patient ID: Yesenia Harper MRN: [id number] DOB/AGE: [birth year] 38 y.o.  Admit Date: 04/09/2023 Discharge Date: 04/12/2023  Preoperative Diagnoses: Heavy vaginal bleeding  Procedures: Procedure(s) (LRB): HYSTERECTOMY, TOTAL, LAPAROSCOPIC, WITH SALPINGECTOMY LYSIS OF ADHESIONS (Bilateral) CYSTOSCOPY (N/A)  Hospital Course:  Yesenia Harper is a 38 y.o. [id number]  admitted for scheduled surgery.  She underwent the procedures as mentioned above, her operation was uncomplicated. For further details about surgery, please refer to the operative report. Patient had a postoperative course complicated by postoperative hematomas below the port sites and possible rectus sheath hematoma. She was hypotensive which responded to fluids and ultimately being transfused 2 units of PRBCs. Her hemoglobin was stable over two days, so I resumed her Xarelto. She had some persistent postop dizziness on her home medications, so we planned to hold Spironolactone and half of her diltiazem dose until a postop blood pressure check with her PCP's office. A message was sent to their office. By time of discharge on POD#3, her pain was controlled on oral pain medications; she was ambulating, voiding without difficulty, tolerating regular diet and passing flatus. She was deemed stable for discharge to home.   Significant Labs:    Latest Ref Rng & Units 04/12/2023    5:14 AM 04/11/2023    6:47 AM 04/10/2023    3:29 PM  CBC  WBC 4.0 - 10.5 K/uL 8.5  10.0  8.2   Hemoglobin 12.0 - 15.0 g/dL 14.7  82.9  56.2   Hematocrit 36.0 - 46.0 % 31.2  31.6  30.3   Platelets 150 - 400 K/uL 243  239  202     Discharge Exam: Blood pressure 101/60, pulse 79, temperature 97.8 F (36.6 C), temperature source Oral, resp. rate 16, height 5\' 2"  (1.575 m), weight 76.7 kg, SpO2 96%. General appearance: alert and no distress  Resp: clear to auscultation bilaterally  Cardio: regular rate and rhythm  GI:  soft, non-tender; bowel sounds normal; no masses, no organomegaly.  Incision: C/D/I, no erythema, no drainage noted Pelvic: Not examined Extremities: extremities normal, atraumatic, no cyanosis or edema and Homans sign is negative, no sign of DVT  Discharged Condition: Stable  Disposition: Discharge disposition: 01-Home or Self Care       Discharge Instructions     Activity as tolerated - No restrictions   Complete by: As directed    Call MD for:  difficulty breathing, headache or visual disturbances   Complete by: As directed    Call MD for:  persistant nausea and vomiting   Complete by: As directed    Call MD for:  redness, tenderness, or signs of infection (pain, swelling, redness, odor or green/yellow discharge around incision site)   Complete by: As directed    Call MD for:  severe uncontrolled pain   Complete by: As directed    Call MD for:  temperature >100.4   Complete by: As directed    Diet - low sodium heart healthy   Complete by: As directed    Discharge wound care:   Complete by: As directed    You have dermabond (which is like super glue) over your incisions. You have no additional care for them. The dermabond will dissolve on its own. You may get the incisions wet.   Driving Restrictions   Complete by: As directed    When not taking narcotic pain medication and would not hesitate to use the brakes, usually about 7 days   May shower / Bathe  Complete by: As directed       Allergies as of 04/12/2023       Reactions   Penicillins Itching, Rash   Patient feels itching in mouth.        Medication List     PAUSE taking these medications    spironolactone 25 MG tablet Wait to take this until your doctor or other care provider tells you to start again. Commonly known as: ALDACTONE Take 25 mg by mouth in the morning.       TAKE these medications    acetaminophen 500 MG tablet Commonly known as: TYLENOL Take 2 tablets (1,000 mg total) by mouth  every 6 (six) hours. What changed:  when to take this reasons to take this   ALPRAZolam 0.25 MG tablet Commonly known as: XANAX Take 0.25 mg by mouth 3 (three) times daily as needed for anxiety.   B-D 3CC LUER-LOK SYR 25GX1/2" 25G X 1-1/2" 3 ML Misc Generic drug: SYRINGE-NEEDLE (DISP) 3 ML Inject 1 each into the muscle once a week.   Botox 200 units injection Generic drug: botulinum toxin Type A Inject 200 Units into the muscle every 3 (three) months. This is done by neurologist --- treatment for migraines   buPROPion HCl ER (XL) 450 MG Tb24 Take 450 mg by mouth in the morning.   cyanocobalamin 1000 MCG/ML injection Commonly known as: VITAMIN B12 Inject 1,000 mcg into the muscle every Wednesday.   diltiazem 120 MG 24 hr capsule Commonly known as: Tiadylt ER Take 1 capsule (120 mg total) by mouth in the morning. What changed: how much to take   etonogestrel 68 MG Impl implant Commonly known as: NEXPLANON Inject 1 each into the skin once.   folic acid 1 MG tablet Commonly known as: FOLVITE Take 1 tablet (1 mg total) by mouth daily.   hydroxychloroquine 200 MG tablet Commonly known as: PLAQUENIL Take 400 mg by mouth daily.   ibuprofen 600 MG tablet Commonly known as: ADVIL Take 1 tablet (600 mg total) by mouth every 6 (six) hours.   lamoTRIgine 25 MG tablet Commonly known as: LAMICTAL Take 50 mg by mouth at bedtime.   levothyroxine 75 MCG tablet Commonly known as: SYNTHROID Take 75 mcg by mouth daily before breakfast.   linaclotide 290 MCG Caps capsule Commonly known as: LINZESS Take 290 mcg by mouth daily.   lurasidone 40 MG Tabs tablet Commonly known as: LATUDA Take 40 mg by mouth every evening.   meclizine 25 MG tablet Commonly known as: ANTIVERT Take 25 mg by mouth 3 (three) times daily as needed for dizziness.   MULTIVITAMIN ADULT PO Take 1 tablet by mouth in the morning.   Nurtec 75 MG Tbdp Generic drug: Rimegepant Sulfate Take 75 mg by mouth  daily as needed for migraine.   omeprazole 20 MG capsule Commonly known as: PRILOSEC Take 20 mg by mouth daily as needed.   ondansetron 8 MG disintegrating tablet Commonly known as: ZOFRAN-ODT TAKE 1 TABLET BY MOUTH EVERY 8 HOURS AS NEEDED FOR NAUSEA   oxyCODONE 5 MG immediate release tablet Commonly known as: Oxy IR/ROXICODONE Take 1-2 tablets (5-10 mg total) by mouth every 4 (four) hours as needed for moderate pain (pain score 4-6).   promethazine 12.5 MG tablet Commonly known as: PHENERGAN Take 12.5 mg by mouth every 4 (four) hours as needed for nausea or vomiting.   Qulipta 60 MG Tabs Generic drug: Atogepant Take 60 mg by mouth in the morning.   Vilazodone HCl  40 MG Tabs Commonly known as: Viibryd Take 1 tablet (40 mg total) by mouth daily.   Vitamin D (Ergocalciferol) 1.25 MG (50000 UNIT) Caps capsule Commonly known as: DRISDOL Take 50,000 Units by mouth every Thursday.   Xarelto 20 MG Tabs tablet Generic drug: rivaroxaban TAKE 1 TABLET BY MOUTH DAILY WITH SUPPER.   zolpidem 10 MG tablet Commonly known as: AMBIEN Take 10 mg by mouth at bedtime.               Discharge Care Instructions  (From admission, onward)           Start     Ordered   04/12/23 0000  Discharge wound care:       Comments: You have dermabond (which is like super glue) over your incisions. You have no additional care for them. The dermabond will dissolve on its own. You may get the incisions wet.   04/12/23 0754           Future Appointments  Date Time Provider Department Center  04/19/2023  1:15 PM [facility] None  04/19/2023  1:30 PM Erenest Blank, NP [facility] None    Follow-up Information     [facility] for [facility] Follow up in 2 week(s).   Specialty: Obstetrics and Gynecology Why: 2 weeks for postop check with Dr. Berton Lan 1 week with Christen Butter for BP check and possible resuming normal BP meds. Contact  information: [address], [address] [place] [address] [phone]                Total discharge time: 30 minutes   Signed:  Milas Hock, MD, FACOG Attending Obstetrician & Gynecologist Faculty Practice, [facility]

## 2023-04-13 ENCOUNTER — Other Ambulatory Visit (HOSPITAL_COMMUNITY): Payer: Self-pay

## 2023-04-17 ENCOUNTER — Encounter: Payer: Self-pay | Admitting: Obstetrics and Gynecology

## 2023-04-17 NOTE — Telephone Encounter (Signed)
 Called patient to review pathology & imaging. She is doing well. Just needing ibuprofen & tylenol for pain, no longer needing oxycodone. Ambulating, tolerating regular diet, voiding & having bowel movements without issue. Reviewed her pictures and discussed this is likely the bleeding around the port sites that we suspected while she was hospitalized. Discussed it may get a little worse before it gets better but should improve with time. She will continue wearing her abdominal binder. She has follow up appointment with me on 4/14.   Harvie Bridge, MD Obstetrician & Gynecologist, [facility] for [employer], [facility]

## 2023-04-18 ENCOUNTER — Encounter: Payer: Self-pay | Admitting: Medical-Surgical

## 2023-04-19 ENCOUNTER — Inpatient Hospital Stay

## 2023-04-19 ENCOUNTER — Inpatient Hospital Stay: Admitting: Family

## 2023-04-22 ENCOUNTER — Ambulatory Visit: Admitting: Obstetrics and Gynecology

## 2023-04-22 ENCOUNTER — Encounter: Payer: Self-pay | Admitting: Obstetrics and Gynecology

## 2023-04-22 VITALS — BP 97/66 | HR 94 | Wt 171.0 lb

## 2023-04-22 DIAGNOSIS — Z09 Encounter for follow-up examination after completed treatment for conditions other than malignant neoplasm: Secondary | ICD-10-CM

## 2023-04-22 NOTE — Patient Instructions (Addendum)
 Post op restrictions - No lifting more than 10-15lbs for 2 more weeks - No soaking incisions or tub baths until cleared  - Nothing in the vagina (intercourse, tampons, etc.) for 10 more weeks  Please let me know if the redness around your belly button doesn't go away by Thursday morning.

## 2023-04-22 NOTE — Progress Notes (Signed)
   POST OPERATIVE VISIT  Subjective:  Yesenia Harper is a 38 y.o. G2P2002 13 days s/p TLH/BS/cysto c/b post operative hypotension suspected to be related to subcutaneous/post site or rectus hematomas  Messaged 4 days ago with back pain and odor that has since resolved. She is using her abdominal binder and taking tylenol/ibuprofen as needed. Tolerating regular diet. Ambulating. No issues with bowel/bladder function. No vaginal bleeding or discharge. No issues with incisions, just bruising.   Objective:   Vitals:   04/22/23 1351  BP: 97/66  Pulse: 94  Weight: 171 lb (77.6 kg)    General:  Alert, oriented and cooperative. Patient is in no acute distress.  Skin: Skin is warm and dry. No rash noted.   Cardiovascular: Normal heart rate noted  Respiratory: Normal respiratory effort, no problems with respiration noted  Abdomen: Soft, non-tender, non-distended. Ecchymosis around suprapubic and RLQ port improved from images in MyChart message.  Umbilical incision with tegaderm/honeycomb still in place. Dressing removed. Mild erythema around umbilical incision but no tenderness/warmth/drainage   Assessment and Plan:  Yesenia Harper is a 38 y.o. POD13 s/p TLH/BS/cysto c/b post op hypotension & likely port site hematomas.   Recovering well. Hgb 10.6 on discharge from hospital.  Continue lifting restrictions for 2 weeks and pelvic rest for 10 weeks Incisions healing appropriately. Mild erythema around umbilical incision. Now that it is uncovered, give ~48h for improvement. If no improvement will send abx for cellulitis.   Return in about 4 weeks (around 05/20/2023) for post op appointment.  Future Appointments  Date Time Provider Department Center  04/23/2023  2:15 PM [facility] None  04/23/2023  2:30 PM Tish Forge, NP [facility] None  05/07/2023  2:40 PM Cherre Cornish, NP [facility] None  05/30/2023  2:30 PM Izell Marsh, MD [facility]   Izell Marsh, MD

## 2023-04-23 ENCOUNTER — Inpatient Hospital Stay: Admitting: Family

## 2023-04-23 ENCOUNTER — Inpatient Hospital Stay

## 2023-04-25 ENCOUNTER — Encounter: Payer: Self-pay | Admitting: Obstetrics and Gynecology

## 2023-04-26 MED ORDER — SULFAMETHOXAZOLE-TRIMETHOPRIM 800-160 MG PO TABS: 1.0000 | ORAL_TABLET | Freq: Two times a day (BID) | ORAL | 0 refills | Status: DC

## 2023-04-29 DIAGNOSIS — F4312 Post-traumatic stress disorder, chronic: Secondary | ICD-10-CM | POA: Diagnosis not present

## 2023-04-29 DIAGNOSIS — F3131 Bipolar disorder, current episode depressed, mild: Secondary | ICD-10-CM | POA: Diagnosis not present

## 2023-04-29 DIAGNOSIS — F411 Generalized anxiety disorder: Secondary | ICD-10-CM | POA: Diagnosis not present

## 2023-04-30 DIAGNOSIS — F4312 Post-traumatic stress disorder, chronic: Secondary | ICD-10-CM | POA: Diagnosis not present

## 2023-04-30 DIAGNOSIS — F411 Generalized anxiety disorder: Secondary | ICD-10-CM | POA: Diagnosis not present

## 2023-04-30 DIAGNOSIS — F3131 Bipolar disorder, current episode depressed, mild: Secondary | ICD-10-CM | POA: Diagnosis not present

## 2023-05-01 ENCOUNTER — Inpatient Hospital Stay: Attending: Medical Oncology

## 2023-05-01 ENCOUNTER — Inpatient Hospital Stay (HOSPITAL_BASED_OUTPATIENT_CLINIC_OR_DEPARTMENT_OTHER): Admitting: Family

## 2023-05-01 ENCOUNTER — Encounter: Payer: Self-pay | Admitting: Family

## 2023-05-01 VITALS — BP 117/76 | HR 88 | Temp 98.8°F | Resp 17 | Wt 170.4 lb

## 2023-05-01 DIAGNOSIS — D649 Anemia, unspecified: Secondary | ICD-10-CM | POA: Diagnosis not present

## 2023-05-01 DIAGNOSIS — N939 Abnormal uterine and vaginal bleeding, unspecified: Secondary | ICD-10-CM

## 2023-05-01 DIAGNOSIS — D500 Iron deficiency anemia secondary to blood loss (chronic): Secondary | ICD-10-CM | POA: Diagnosis not present

## 2023-05-01 DIAGNOSIS — D509 Iron deficiency anemia, unspecified: Secondary | ICD-10-CM | POA: Insufficient documentation

## 2023-05-01 DIAGNOSIS — Z86718 Personal history of other venous thrombosis and embolism: Secondary | ICD-10-CM | POA: Diagnosis not present

## 2023-05-01 DIAGNOSIS — N920 Excessive and frequent menstruation with regular cycle: Secondary | ICD-10-CM | POA: Diagnosis not present

## 2023-05-01 LAB — RETICULOCYTES
Immature Retic Fract: 3.6 % (ref 2.3–15.9)
RBC.: 4.43 MIL/uL (ref 3.87–5.11)
Retic Count, Absolute: 44.3 10*3/uL (ref 19.0–186.0)
Retic Ct Pct: 1 % (ref 0.4–3.1)

## 2023-05-01 LAB — CBC WITH DIFFERENTIAL (CANCER CENTER ONLY)
Abs Immature Granulocytes: 0.06 10*3/uL (ref 0.00–0.07)
Basophils Absolute: 0.1 10*3/uL (ref 0.0–0.1)
Basophils Relative: 1 %
Eosinophils Absolute: 0.1 10*3/uL (ref 0.0–0.5)
Eosinophils Relative: 2 %
HCT: 40.3 % (ref 36.0–46.0)
Hemoglobin: 13.5 g/dL (ref 12.0–15.0)
Immature Granulocytes: 1 %
Lymphocytes Relative: 28 %
Lymphs Abs: 2 10*3/uL (ref 0.7–4.0)
MCH: 30.3 pg (ref 26.0–34.0)
MCHC: 33.5 g/dL (ref 30.0–36.0)
MCV: 90.4 fL (ref 80.0–100.0)
Monocytes Absolute: 0.6 10*3/uL (ref 0.1–1.0)
Monocytes Relative: 9 %
Neutro Abs: 4.2 10*3/uL (ref 1.7–7.7)
Neutrophils Relative %: 59 %
Platelet Count: 400 10*3/uL (ref 150–400)
RBC: 4.46 MIL/uL (ref 3.87–5.11)
RDW: 13.8 % (ref 11.5–15.5)
WBC Count: 7 10*3/uL (ref 4.0–10.5)
nRBC: 0 % (ref 0.0–0.2)

## 2023-05-01 LAB — CMP (CANCER CENTER ONLY)
ALT: 35 U/L (ref 0–44)
AST: 19 U/L (ref 15–41)
Albumin: 4.7 g/dL (ref 3.5–5.0)
Alkaline Phosphatase: 110 U/L (ref 38–126)
Anion gap: 14 (ref 5–15)
BUN: 14 mg/dL (ref 6–20)
CO2: 22 mmol/L (ref 22–32)
Calcium: 10 mg/dL (ref 8.9–10.3)
Chloride: 105 mmol/L (ref 98–111)
Creatinine: 0.96 mg/dL (ref 0.44–1.00)
GFR, Estimated: >60 mL/min (ref >60)
Glucose, Bld: 92 mg/dL (ref 70–99)
Potassium: 4.4 mmol/L (ref 3.5–5.1)
Sodium: 141 mmol/L (ref 135–145)
Total Bilirubin: <0.2 mg/dL (ref 0.0–1.2)
Total Protein: 7.3 g/dL (ref 6.5–8.1)

## 2023-05-01 LAB — FERRITIN: Ferritin: 190 ng/mL (ref 11–307)

## 2023-05-01 LAB — SAMPLE TO BLOOD BANK

## 2023-05-01 NOTE — Progress Notes (Signed)
 Hematology and Oncology Follow Up Visit  Yesenia Harper [id number] Jun 04, 1985 38 y.o. 05/01/2023   Principle Diagnosis:  May-Thurner Syndrome Recurrent DVT in the left lower extremity Iron deficiency anemia     Current Therapy:        Xarelto  20 mg PO daily- adjusted to 10 mg on 02/20/2023 due to bleeding - stopped due to heavy menses 03/05/2023 Tranfusional support as needed IV iron as indicated B12 IM weekly   Interim History:  Yesenia Harper is here today for follow-up. She had her hysterectomy on 03/22/2023 and has recuperated nicely. She did receive 2 units of blood during admission post surgery.  Hgb today is 13.5, MCV 90, platelets 400 and WBC count 7.0.  She has some mild fatigue but overall is starting to feel better.  Her umbilical incision site is healing. She has one more day left on Bactrim  DS.  She is back on Xarelto  and tolerating nicely.  No blood loss noted. No bruising or petechiae.  No fever, chills, n/v, cough, rash, dizziness, SOB, chest pain, palpitations, abdominal pain or changes in bowel or bladder habits.  No swelling in her extremities.  Neuropathy stable, unchanged from baseline.  No falls or syncope.  Appetite and hydration are good. Weight is stable at 170 lbs.   ECOG Performance Status: 1 - Symptomatic but completely ambulatory  Medications:  Allergies as of 05/01/2023       Reactions   Penicillins Itching, Rash   Patient feels itching in mouth.        Medication List        Accurate as of May 01, 2023  2:50 PM. If you have any questions, ask your nurse or doctor.          PAUSE taking these medications    spironolactone  25 MG tablet Wait to take this until your doctor or other care provider tells you to start again. Commonly known as: ALDACTONE  Take 25 mg by mouth in the morning.       TAKE these medications    Acetaminophen  Extra Strength 500 MG Tabs Take 2 tablets (1,000 mg total) by mouth every 6 (six) hours.    ALPRAZolam  0.25 MG tablet Commonly known as: XANAX  Take 0.25 mg by mouth 3 (three) times daily as needed for anxiety.   B-D 3CC LUER-LOK SYR 25GX1/2" 25G X 1-1/2" 3 ML Misc Generic drug: SYRINGE-NEEDLE (DISP) 3 ML Inject 1 each into the muscle once a week.   Botox 200 units injection Generic drug: botulinum toxin Type A Inject 200 Units into the muscle every 3 (three) months. This is done by neurologist --- treatment for migraines   buPROPion  HCl ER (XL) 450 MG Tb24 Take 450 mg by mouth in the morning.   cyanocobalamin  1000 MCG/ML injection Commonly known as: VITAMIN B12 Inject 1,000 mcg into the muscle every Wednesday.   diltiazem  120 MG 24 hr capsule Commonly known as: CARDIZEM  CD Take 1 capsule (120 mg total) by mouth in the morning.   etonogestrel  68 MG Impl implant Commonly known as: NEXPLANON  Inject 1 each into the skin once.   folic acid  1 MG tablet Commonly known as: FOLVITE  Take 1 tablet (1 mg total) by mouth daily.   hydroxychloroquine  200 MG tablet Commonly known as: PLAQUENIL  Take 400 mg by mouth daily.   ibuprofen  600 MG tablet Commonly known as: ADVIL  Take 1 tablet (600 mg total) by mouth every 6 (six) hours.   lamoTRIgine  25 MG tablet Commonly known as: LAMICTAL  Take 50  mg by mouth at bedtime.   levothyroxine  75 MCG tablet Commonly known as: SYNTHROID  Take 75 mcg by mouth daily before breakfast.   linaclotide  290 MCG Caps capsule Commonly known as: LINZESS  Take 290 mcg by mouth daily.   lurasidone  40 MG Tabs tablet Commonly known as: LATUDA  Take 40 mg by mouth every evening.   meclizine  25 MG tablet Commonly known as: ANTIVERT  Take 25 mg by mouth 3 (three) times daily as needed for dizziness.   MULTIVITAMIN ADULT PO Take 1 tablet by mouth in the morning.   Nurtec 75 MG Tbdp Generic drug: Rimegepant Sulfate Take 75 mg by mouth daily as needed for migraine.   omeprazole 20 MG capsule Commonly known as: PRILOSEC Take 20 mg by mouth  daily as needed.   ondansetron  8 MG disintegrating tablet Commonly known as: ZOFRAN -ODT TAKE 1 TABLET BY MOUTH EVERY 8 HOURS AS NEEDED FOR NAUSEA   oxyCODONE  5 MG immediate release tablet Commonly known as: Oxy IR/ROXICODONE  Take 1-2 tablets (5-10 mg total) by mouth every 4 (four) hours as needed for moderate pain (pain score 4-6).   promethazine  12.5 MG tablet Commonly known as: PHENERGAN  Take 12.5 mg by mouth every 4 (four) hours as needed for nausea or vomiting.   Qulipta  60 MG Tabs Generic drug: Atogepant  Take 60 mg by mouth in the morning.   sulfamethoxazole -trimethoprim  800-160 MG tablet Commonly known as: BACTRIM  DS Take 1 tablet by mouth 2 (two) times daily.   Vilazodone  HCl 40 MG Tabs Commonly known as: Viibryd  Take 1 tablet (40 mg total) by mouth daily.   Vitamin D (Ergocalciferol) 1.25 MG (50000 UNIT) Caps capsule Commonly known as: DRISDOL Take 50,000 Units by mouth every Thursday.   Xarelto  20 MG Tabs tablet Generic drug: rivaroxaban  TAKE 1 TABLET BY MOUTH DAILY WITH SUPPER.   zolpidem  10 MG tablet Commonly known as: AMBIEN  Take 10 mg by mouth at bedtime.        Allergies:  Allergies  Allergen Reactions   Penicillins Itching and Rash    Patient feels itching in mouth.    Past Medical History, Surgical history, Social history, and Family History were reviewed and updated.  Review of Systems: All other 10 point review of systems is negative.   Physical Exam:  vitals were not taken for this visit.   Wt Readings from Last 3 Encounters:  04/22/23 171 lb (77.6 kg)  04/09/23 169 lb (76.7 kg)  03/22/23 169 lb (76.7 kg)    Ocular: Sclerae unicteric, pupils equal, round and reactive to light Ear-nose-throat: Oropharynx clear, dentition fair Lymphatic: No cervical or supraclavicular adenopathy Lungs no rales or rhonchi, good excursion bilaterally Heart regular rate and rhythm, no murmur appreciated Abd soft, nontender, positive bowel sounds MSK  no focal spinal tenderness, no joint edema Neuro: non-focal, well-oriented, appropriate affect Breasts: Deferred   Lab Results  Component Value Date   WBC 7.0 05/01/2023   HGB 13.5 05/01/2023   HCT 40.3 05/01/2023   MCV 90.4 05/01/2023   PLT 400 05/01/2023   Lab Results  Component Value Date   FERRITIN 217 03/20/2023   IRON 96 03/20/2023   TIBC 300 03/20/2023   UIBC 204 03/20/2023   IRONPCTSAT 32 (H) 03/20/2023   Lab Results  Component Value Date   RETICCTPCT 1.0 05/01/2023   RBC 4.43 05/01/2023   No results found for: "KPAFRELGTCHN", "LAMBDASER", "KAPLAMBRATIO" No results found for: "IGGSERUM", "IGA", "IGMSERUM" No results found for: "TOTALPROTELP", "ALBUMINELP", "A1GS", "A2GS", "BETS", "BETA2SER", "GAMS", "MSPIKE", "SPEI"  Chemistry      Component Value Date/Time   NA 139 04/11/2023 0647   NA 138 04/16/2022 0000   K 3.8 04/11/2023 0647   CL 106 04/11/2023 0647   CO2 26 04/11/2023 0647   BUN 10 04/11/2023 0647   BUN 13 04/16/2022 0000   CREATININE 0.80 04/11/2023 0647   CREATININE 0.85 03/20/2023 1424   CREATININE 0.78 05/12/2021 0000   GLU 102 04/16/2022 0000      Component Value Date/Time   CALCIUM 8.6 (L) 04/11/2023 0647   ALKPHOS 55 04/11/2023 0647   AST 19 04/11/2023 0647   AST 13 (L) 03/20/2023 1424   ALT 18 04/11/2023 0647   ALT 25 03/20/2023 1424   BILITOT 0.5 04/11/2023 0647   BILITOT 0.4 03/20/2023 1424       Impression and Plan: Yesenia Harper is a very pleasant 38 yo caucasian female with history recurrent left lower extremity DVT and [name]. Hyper coag panel was negative. Her most recent DVT was in Dec 2023.  Hopefully her IDA will now resolve post hysterectomy.  Iron studies are pending. We will replace if needed.  She is back on Xarelto  and will continue her daily regimen.  Follow-up in 3 months.   Kennard Pea, NP 4/23/20252:50 PM

## 2023-05-02 LAB — IRON AND IRON BINDING CAPACITY (CC-WL,HP ONLY)
Iron: 95 ug/dL (ref 28–170)
Saturation Ratios: 34 % — ABNORMAL HIGH (ref 10.4–31.8)
TIBC: 283 ug/dL (ref 250–450)
UIBC: 188 ug/dL (ref 148–442)

## 2023-05-03 ENCOUNTER — Encounter: Payer: Self-pay | Admitting: Family

## 2023-05-07 ENCOUNTER — Ambulatory Visit: Admitting: Medical-Surgical

## 2023-05-09 DIAGNOSIS — G43719 Chronic migraine without aura, intractable, without status migrainosus: Secondary | ICD-10-CM | POA: Diagnosis not present

## 2023-05-09 DIAGNOSIS — R251 Tremor, unspecified: Secondary | ICD-10-CM | POA: Diagnosis not present

## 2023-05-09 DIAGNOSIS — G8929 Other chronic pain: Secondary | ICD-10-CM | POA: Diagnosis not present

## 2023-05-09 DIAGNOSIS — M545 Low back pain, unspecified: Secondary | ICD-10-CM | POA: Diagnosis not present

## 2023-05-13 ENCOUNTER — Ambulatory Visit: Admitting: Medical-Surgical

## 2023-05-17 ENCOUNTER — Ambulatory Visit: Admitting: Medical-Surgical

## 2023-05-23 ENCOUNTER — Ambulatory Visit: Admitting: Medical-Surgical

## 2023-05-24 ENCOUNTER — Ambulatory Visit: Admitting: Medical-Surgical

## 2023-05-24 DIAGNOSIS — F4312 Post-traumatic stress disorder, chronic: Secondary | ICD-10-CM | POA: Diagnosis not present

## 2023-05-24 DIAGNOSIS — F3131 Bipolar disorder, current episode depressed, mild: Secondary | ICD-10-CM | POA: Diagnosis not present

## 2023-05-24 DIAGNOSIS — F411 Generalized anxiety disorder: Secondary | ICD-10-CM | POA: Diagnosis not present

## 2023-05-27 ENCOUNTER — Ambulatory Visit: Admitting: Medical-Surgical

## 2023-05-27 NOTE — Progress Notes (Deleted)
   Established patient visit  History, exam, impression, and plan:  No problem-specific Assessment & Plan notes found for this encounter.   ROS  Physical Exam  Procedures performed this visit: None.  No follow-ups on file.  __________________________________ Thayer Ohm, DNP, APRN, FNP-BC Primary Care and Sports Medicine [facility]

## 2023-05-28 ENCOUNTER — Ambulatory Visit: Admitting: Medical-Surgical

## 2023-05-28 ENCOUNTER — Encounter: Payer: Self-pay | Admitting: Medical-Surgical

## 2023-05-28 VITALS — BP 107/75 | HR 112 | Resp 20 | Ht 62.0 in | Wt 173.0 lb

## 2023-05-28 DIAGNOSIS — Z0130 Encounter for examination of blood pressure without abnormal findings: Secondary | ICD-10-CM

## 2023-05-28 DIAGNOSIS — E039 Hypothyroidism, unspecified: Secondary | ICD-10-CM | POA: Diagnosis not present

## 2023-05-28 DIAGNOSIS — R635 Abnormal weight gain: Secondary | ICD-10-CM

## 2023-05-28 DIAGNOSIS — K5900 Constipation, unspecified: Secondary | ICD-10-CM | POA: Diagnosis not present

## 2023-05-28 MED ORDER — METFORMIN HCL ER 500 MG PO TB24: 500.0000 mg | ORAL_TABLET | Freq: Every day | ORAL | 1 refills | Status: DC

## 2023-05-28 NOTE — Progress Notes (Signed)
        Established patient visit  History, exam, impression, and plan:  1. Weight gain (Primary) Pleasant 38 year old female presenting today to discuss issues with weight gain.  She has been having significant difficulty with weight loss efforts due to her complicated health history and associated limitations.  Is interested in medications that may help with weight loss.  Notes that she is often intolerant of exercise which makes it very difficult to lose weight.  Reviewed weight loss options.  She is currently insured under Medicaid so we will be very limited in what we are able to do.  Would like to avoid phentermine given her current medications and treatment for right heart failure.  She is already on Wellbutrin  at 450 mg daily.  She is already on Lamictal  so would like to avoid Topamax as well.  After review of options, feel that metformin would be our only option for off label use.  She is agreeable to give this a try so starting metformin XR 500 mg daily.  Discussed referral to healthy weight and wellness and she will consider this should the metformin not be well-tolerated or helpful.  2. Hypothyroidism, unspecified type History of hypothyroidism previously treated with levothyroxine  75 mcg daily.  Reports that she is overdue for having her labs checked and would like to get those done while she is here today.  She does have a number of symptoms but unable to correlate specifically with thyroid  function.  Annual full thyroid  panel today. - TSH+T4F+T3Free+ThyAbs+TPO+VD25  3. Blood pressure check Here to discuss hospital discharge follow-up.  She had hysterectomy with OB/GYN and had a difficult recovery.  Her blood pressure crashed and stayed very low for about 8 hours.  They were unable to keep her blood pressure boosted until they gave her albumin .  After that, her blood pressure did go back up however it started to resettle.  She was sent home with instructions to follow-up with her PCP  regarding restarting spironolactone .  Today she reports with a blood pressure of 107/75.  Previously on spironolactone  to help with swelling of the left lower extremity from pelvic congestion however this has not been an issue.  She has been off the medication for at least 1 month and has not developed any worsening edema of the lower extremities.  With her blood pressure being stable and on the lower side of normal, would like her to hold off on spironolactone .  If her swelling worsens, we can always reevaluate for possible daily as needed use rather than daily dosing.  Procedures performed this visit: None.  Return in about 1 year (around 05/27/2024) for annual physical exam.  __________________________________ Yesenia Snook, DNP, APRN, FNP-BC Primary Care and Sports Medicine [facility]

## 2023-05-29 ENCOUNTER — Ambulatory Visit: Payer: Self-pay | Admitting: Medical-Surgical

## 2023-05-29 DIAGNOSIS — M5417 Radiculopathy, lumbosacral region: Secondary | ICD-10-CM | POA: Diagnosis not present

## 2023-05-29 DIAGNOSIS — M542 Cervicalgia: Secondary | ICD-10-CM | POA: Diagnosis not present

## 2023-05-29 DIAGNOSIS — G43109 Migraine with aura, not intractable, without status migrainosus: Secondary | ICD-10-CM | POA: Diagnosis not present

## 2023-05-29 DIAGNOSIS — G4700 Insomnia, unspecified: Secondary | ICD-10-CM | POA: Diagnosis not present

## 2023-05-29 DIAGNOSIS — E039 Hypothyroidism, unspecified: Secondary | ICD-10-CM

## 2023-05-29 DIAGNOSIS — M545 Low back pain, unspecified: Secondary | ICD-10-CM | POA: Diagnosis not present

## 2023-05-29 DIAGNOSIS — M5412 Radiculopathy, cervical region: Secondary | ICD-10-CM | POA: Diagnosis not present

## 2023-05-29 DIAGNOSIS — G5603 Carpal tunnel syndrome, bilateral upper limbs: Secondary | ICD-10-CM | POA: Diagnosis not present

## 2023-05-29 LAB — TSH+T4F+T3FREE+THYABS+TPO+VD25
Free T4: 1.02 ng/dL (ref 0.82–1.77)
T3, Free: 2.2 pg/mL (ref 2.0–4.4)
TSH: 3.39 u[IU]/mL (ref 0.450–4.500)
Thyroglobulin Antibody: <1 [IU]/mL (ref 0.0–0.9)
Thyroperoxidase Ab SerPl-aCnc: 25 [IU]/mL (ref 0–34)
Vit D, 25-Hydroxy: 66.2 ng/mL (ref 30.0–100.0)

## 2023-05-30 ENCOUNTER — Encounter: Admitting: Obstetrics and Gynecology

## 2023-05-30 ENCOUNTER — Encounter: Payer: Self-pay | Admitting: Medical-Surgical

## 2023-05-30 MED ORDER — LEVOTHYROXINE SODIUM 88 MCG PO TABS: 88.0000 ug | ORAL_TABLET | Freq: Every day | ORAL | 3 refills | Status: AC

## 2023-05-30 NOTE — Progress Notes (Deleted)
   POST OPERATIVE GYNECOLOGY VISIT  Subjective:  Yesenia Harper is a 38 y.o. G2P2002 7wk s/p TLH/BS/cysto c/b post operative hypotension suspected to be related to subcutaneous/port site or rectus hematomas   Path benign  Objective:  There were no vitals filed for this visit.  General:  Alert, oriented and cooperative. Patient is in no acute distress.  Skin: Skin is warm and dry. No rash noted.   Cardiovascular: Normal heart rate noted  Respiratory: Normal respiratory effort, no problems with respiration noted  Abdomen: Soft, non-tender, non-distended   Pelvic: NEFG.   Exam performed in the presence of a chaperone  Assessment and Plan:  Yesenia Harper is a 38 y.o. with ***  There are no diagnoses linked to this encounter.  No follow-ups on file.  Future Appointments  Date Time Provider Department Center  05/30/2023  2:30 PM Izell Marsh, MD [facility]  07/31/2023  2:00 PM [facility] None  07/31/2023  2:15 PM Tish Forge, NP [facility] None    Izell Marsh, MD

## 2023-06-04 ENCOUNTER — Encounter: Payer: Self-pay | Admitting: Medical-Surgical

## 2023-06-04 DIAGNOSIS — F411 Generalized anxiety disorder: Secondary | ICD-10-CM | POA: Diagnosis not present

## 2023-06-04 DIAGNOSIS — I890 Lymphedema, not elsewhere classified: Secondary | ICD-10-CM

## 2023-06-04 DIAGNOSIS — F3131 Bipolar disorder, current episode depressed, mild: Secondary | ICD-10-CM | POA: Diagnosis not present

## 2023-06-04 DIAGNOSIS — F4312 Post-traumatic stress disorder, chronic: Secondary | ICD-10-CM | POA: Diagnosis not present

## 2023-06-05 DIAGNOSIS — M542 Cervicalgia: Secondary | ICD-10-CM | POA: Diagnosis not present

## 2023-06-05 DIAGNOSIS — M545 Low back pain, unspecified: Secondary | ICD-10-CM | POA: Diagnosis not present

## 2023-06-05 DIAGNOSIS — G8929 Other chronic pain: Secondary | ICD-10-CM | POA: Diagnosis not present

## 2023-06-06 ENCOUNTER — Telehealth: Payer: Self-pay

## 2023-06-06 DIAGNOSIS — F418 Other specified anxiety disorders: Secondary | ICD-10-CM

## 2023-06-07 ENCOUNTER — Telehealth: Payer: Self-pay

## 2023-06-07 NOTE — Progress Notes (Signed)
 Complex Care Management Note  Care Guide Note 06/07/2023 Name: Jaiyla Granados MRN: [id number] DOB: September 21, 1985  Sumie Remsen is a 38 y.o. year old female who sees Cherre Cornish, NP for primary care. I reached out to Mollie Anger by phone today to offer complex care management services.  Ms. Mabie was given information about Complex Care Management services today including:   The Complex Care Management services include support from the care team which includes your Nurse Care Manager, Clinical Social Worker, or Pharmacist.  The Complex Care Management team is here to help remove barriers to the health concerns and goals most important to you. Complex Care Management services are voluntary, and the patient may decline or stop services at any time by request to their care team member.   Complex Care Management Consent Status: Patient agreed to services and verbal consent obtained.   Follow up plan:  Telephone appointment with complex care management team member scheduled for:  [facility] 06/12/2023 LCSW 06/19/2023  Encounter Outcome:  Patient Scheduled  Lenton Rail , RMA     [place]  [facility], [facility] Guide  Direct Dial: [phone]  Website: [email or web address]

## 2023-06-12 ENCOUNTER — Telehealth: Payer: Self-pay

## 2023-06-19 ENCOUNTER — Other Ambulatory Visit: Payer: Self-pay | Admitting: Licensed Clinical Social Worker

## 2023-06-19 NOTE — Patient Instructions (Signed)
 Visit Information  Thank you for taking time to visit with me today. Please don't hesitate to contact me if I can be of assistance to you before our next scheduled appointment.  Our next appointment is by telephone on 07/10/2023, Please call the care guide team at [phone] if you need to cancel or reschedule your appointment.   Following is a copy of your care plan:   Goals Addressed             This Visit's Progress    VBCI Social Work Care Plan       Problems:   Disease Management support and education needs related to Anxiety with Excessive Worry, and Depression: depressed mood  CSW Clinical Goal(s):   Over the next 6 weeks the Patient will demonstrate a reduction in symptoms related to Anxiety with Excessive Worry, Depression: depressed mood .  Interventions:  Mental Health:  Evaluation of current treatment plan related to Depression: depressed mood Active listening / Reflection utilized Behavioral Activation reviewed Depression screen reviewed Emotional Support Provided Mindfulness or Relaxation training provided Motivational Interviewing employed Quality of sleep assessed & Sleep Hygiene techniques promoted  Patient Goals/Self-Care Activities:  Continue taking your medication as prescribed.   Review education sent on behavioral activation and grounding exercises. Monitor for changes in mood and utilize coping skills as needed.  Plan:   Telephone follow up appointment with care management team member scheduled for:  07/10/2023        Please call the Suicide and Crisis Lifeline: 988 if you are experiencing a Mental Health or Behavioral Health Crisis or need someone to talk to.  Patient verbalizes understanding of instructions and care plan provided today and agrees to view in MyChart. Active MyChart status and patient understanding of how to access instructions and care plan via MyChart confirmed with patient.     Hale Level, LCSW [place], [facility] Licensed Clinical Social Worker Care Coordinator [phone]

## 2023-06-19 NOTE — Patient Outreach (Signed)
 Complex Care Management   Visit Note  06/19/2023  Name:  Yesenia Harper MRN: [id number] DOB: [birth year]  Situation: Referral received for Complex Care Management related to MDD I obtained verbal consent from Patient.  Visit completed with patient  on the phone  Background:   Past Medical History:  Diagnosis Date   Abnormal uterine bleeding (AUB)    Anticoagulant long-term use    xalrelto--- managed by dr ennever/  Isa Manuel carter NP   Bilateral carpal tunnel syndrome 02/23/2011   Chiari malformation type I [facility])    s/p suboccipital craniectomy w/ chiari decompression 06-30-2021 by neurosurgeon dr d. Beatris Lincoln  @ [email or web address]   Chronic pain syndrome    followed by [place] [facility]--- dr c. Candance Certain;     and myofascial pain of lumbar   Cognitive impairment, mild, so stated 05/23/2014   Congestive heart failure with right heart failure (HCC) 2022   followed by [facility] @ [facility]; right-sidedd;       pt referred to HF clinic when incidental RA pressure of 10 w/ relfux contrast in the hepatic veins was found at time of angioplasty/ stenting left common vein & left external iliac vein in 11/ 2022   DDD (degenerative disc disease), cervical    DDD (degenerative disc disease), lumbosacral    Edema of both lower extremities    GAD (generalized anxiety disorder)    History of DVT of lower extremity    2006 ---  LLE  completed coumadin  therapy;    and recurrent 12/ 2023 LLE non-occlusive thrombus within left common femoral vein   Hypothyroidism    Iron deficiency anemia due to chronic blood loss    May-Thurner syndrome 12/13/2020   DX 11/ 2022;    s/p median arcuate ligament release 04-05-2022   MDD (major depressive disorder)    Median arcuate ligament syndrome (HCC)    s/p  MALS surgical release 04-05-2022   Migraine without aura, intractable, without status migrainosus    neurologist--- mark pane PA;   treated w/ botox injections   Pelvic congestion syndrome 11/2020   s/p  left  ovarian vein coil embolization 11-17-2020   Peripheral vascular disease (HCC)    vascular--- dr Jacqulyne Maxim (lov in care everywhere 02-23-2022)   s/p  angioplasty/ stenting left common & left external iliac veins w/ overlapping   Scoliosis 12/18/2019   Sjogren syndrome with central nervous system involvement [facility])    rheumatologist--- dr a. Arlester Bence    Assessment: Patient Reported Symptoms:  Cognitive Cognitive Status: Alert and oriented to person, place, and time, Insightful and able to interpret abstract concepts, Normal speech and language skills Cognitive/Intellectual Conditions Management [RPT]: None reported or documented in medical history or problem list   Health Maintenance Behaviors: Annual physical exam, Hobbies, Stress management Healing Pattern: Unsure Health Facilitated by: Stress management  Neurological Neurological Review of Symptoms: No symptoms reported    HEENT HEENT Symptoms Reported: No symptoms reported      Cardiovascular Cardiovascular Symptoms Reported: Fatigue Cardiovascular Conditions:  (DVT, May-Thurner Syndrome) Cardiovascular Management Strategies: Activity, Coping strategies, Medication therapy Cardiovascular Self-Management Outcome: 3 (uncertain) Cardiovascular Comment: [facility] - Follow her for cardio  Respiratory Respiratory Symptoms Reported: Not assesed    Endocrine Patient reports the following symptoms related to hypoglycemia or hyperglycemia : Not assessed    Gastrointestinal Gastrointestinal Symptoms Reported: Not assessed      Genitourinary Genitourinary Symptoms Reported: Not assessed    Integumentary Integumentary Symptoms Reported: Not assessed  Musculoskeletal Musculoskelatal Symptoms Reviewed: Not assessed        Psychosocial Psychosocial Symptoms Reported: Anxiety - if selected complete GAD, Depression - if selected complete PHQ 2-9 Additional Psychological Details: Reports worsening MH symptoms since  medical conditions worsened. Current biggest stressors are waiting for disability decision after appeal hearing - also lack of daily routine and lack of work, lack of purpose Behavioral Health Conditions: Anxiety, Depression Behavioral Management Strategies: Activity, Coping strategies, Counseling, Medication therapy, Support system Behavioral Health Self-Management Outcome: 3 (uncertain) Behavioral Health Comment: Seeing counselor and psychiatrist with [facility] Change/Loss/Stressor/Fears (CP): Medical condition, self, Resources Behaviors When Feeling Stressed/Fearful: Feelings of overwhelmed Techniques to Cope with Loss/Stress/Change: Medication, Diversional activities, Counseling Quality of Family Relationships: helpful, involved, supportive Do you feel physically threatened by others?: No      06/19/2023   11:45 AM  Depression screen PHQ 2/9  Decreased Interest 2  Down, Depressed, Hopeless 1  PHQ - 2 Score 3  Altered sleeping 3  Tired, decreased energy 2  Change in appetite 2  Feeling bad or failure about yourself  2  Trouble concentrating 1  Moving slowly or fidgety/restless 1  Suicidal thoughts 0  PHQ-9 Score 14  Difficult doing work/chores Somewhat difficult    There were no vitals filed for this visit.  Medications Reviewed Today   Medications were not reviewed in this encounter     Recommendation:     Continue taking your medication as prescribed.   Review education sent on behavioral activation and grounding exercises. Monitor for changes in mood and utilize coping skills as needed  Follow Up Plan:   Telephone follow up appointment date/time:  07/10/2023  Hale Level, LCSW [place], [facility] Licensed Clinical Social Worker Care Coordinator [phone]

## 2023-06-25 ENCOUNTER — Other Ambulatory Visit: Payer: Self-pay

## 2023-06-25 DIAGNOSIS — I890 Lymphedema, not elsewhere classified: Secondary | ICD-10-CM | POA: Diagnosis not present

## 2023-06-26 ENCOUNTER — Encounter: Payer: Self-pay | Admitting: Vascular Surgery

## 2023-06-26 ENCOUNTER — Encounter: Payer: Self-pay | Admitting: Obstetrics and Gynecology

## 2023-06-26 ENCOUNTER — Ambulatory Visit: Payer: Self-pay | Admitting: Obstetrics and Gynecology

## 2023-06-26 ENCOUNTER — Telehealth: Payer: Self-pay | Admitting: Vascular Surgery

## 2023-06-26 ENCOUNTER — Ambulatory Visit: Admitting: Obstetrics and Gynecology

## 2023-06-26 VITALS — BP 109/78 | HR 93 | Ht 62.0 in | Wt 167.0 lb

## 2023-06-26 DIAGNOSIS — R350 Frequency of micturition: Secondary | ICD-10-CM | POA: Diagnosis not present

## 2023-06-26 DIAGNOSIS — Z09 Encounter for follow-up examination after completed treatment for conditions other than malignant neoplasm: Secondary | ICD-10-CM | POA: Diagnosis not present

## 2023-06-26 DIAGNOSIS — Z23 Encounter for immunization: Secondary | ICD-10-CM | POA: Diagnosis not present

## 2023-06-26 LAB — POCT URINALYSIS DIPSTICK
Bilirubin, UA: NEGATIVE
Blood, UA: NEGATIVE
Glucose, UA: NEGATIVE
Ketones, UA: NEGATIVE
Leukocytes, UA: NEGATIVE
Nitrite, UA: NEGATIVE
Protein, UA: NEGATIVE
Spec Grav, UA: 1.02 (ref 1.010–1.025)
Urobilinogen, UA: NEGATIVE U/dL — AB
pH, UA: 7 (ref 5.0–8.0)

## 2023-06-26 NOTE — Progress Notes (Signed)
   POST OPERATIVE GYNECOLOGY VISIT  Subjective:  Yesenia Harper is a 38 y.o. [id number] 11wk s/p TLH/BS/cysto c/b post operative hypotension suspected to be related to subcutaneous/port site or rectus hematomas   Path benign. Since our last appt, she has recovered well. No more pain and bruising around her incisions resolved. Incisions are well healed. She has noticed more urinary frequency and increased urge to urinate since her hysterectomy. No dysuria/f/c. She is having issues with palpitations/heart racing and is currently wearing a 7 day holter monitor. She has follow up with cardiology planned.   Family is doing OK. Her father is really struggling with her sister's death. Still no answers from her sister's autopsy.   Needs to return for nexplanon  removal.   Objective:   Vitals:   06/26/23 1520  BP: 109/78  Pulse: 93  Weight: 167 lb (75.8 kg)  Height: 5' 2 (1.575 m)   General:  Alert, oriented and cooperative. Patient is in no acute distress.  Skin: Skin is warm and dry. No rash noted.   Cardiovascular: Normal heart rate noted  Respiratory: Normal respiratory effort, no problems with respiration noted  Abdomen: Soft, non-tender, non-distended. Incisions (umbilical, RLQ, LLQ, and suprapubic) all well healed  Pelvic: NEFG. Cuff well healed. Visually & palpably intact.   Exam performed in the presence of a chaperone  Assessment and Plan:  Yesenia Harper is a 38 y.o. 11 weeks s/p TLH/BS/cysto, recovered appropriately but with urinary frequency/urgency  Post op - pelvic rest x 1 week then OK to resume all activities - will return for nexplanon  removal - would like to complete hpv vaccination course, 2nd dose today  2. Urinary frequency/urgency Urine dip w/out evidence of infection Bladder training information given PFPT -     Ambulatory referral to Physical Therapy -     POCT urinalysis dipstick  Future Appointments  Date Time Provider Department Center  07/10/2023  1:00 PM  Jens Molder, LCSW [facility] None  07/25/2023  3:00 PM Randye Buttner, RN [facility] None  07/31/2023  2:00 PM [facility] None  07/31/2023  2:15 PM Tish Forge, NP [facility] None  08/21/2023 11:00 AM [facility]  08/21/2023 12:20 PM Adine Hoof, MD [facility]   Izell Marsh, MD

## 2023-06-26 NOTE — Patient Instructions (Signed)
 Visit Information  Thank you for taking time to visit with me today. Please don't hesitate to contact me if I can be of assistance to you before our next scheduled telephone appointment.  Our next appointment is by telephone on 07/25/23 at 3pm  Following is a copy of your care plan:   Goals Addressed             This Visit's Progress    VBCI RN Care Plan       Problems:  Chronic Disease Management support and education needs related to CHF and Weight Management  Goal: Over the next 3 months the Patient will continue to work with RN Care Manager and/or Social Worker to address care management and care coordination needs related to CHF and Weight Management as evidenced by adherence to care management team scheduled appointments     work with Child psychotherapist to address interventions such as medications including anxiolytics & antidepressants and counseling  related to the management of Anxiety and Depression as evidenced by review of electronic medical record and patient or social worker report      Interventions:   Heart Failure Interventions: Basic overview and discussion of pathophysiology of Heart Failure reviewed Provided education on low sodium diet Advised patient to weigh each morning after emptying bladder Discussed importance of daily weight and advised patient to weigh and record daily Reviewed role of diuretics in prevention of fluid overload and management of heart failure; Discussed the importance of keeping all appointments with provider Provided patient with education about the role of exercise in the management of heart failure Screening for signs and symptoms of depression related to chronic disease state  Assessed social determinant of health barriers   Weight Loss Interventions: Advised patient to discuss with primary care provider options regarding weight management Screening for signs and symptoms of depression Reviewed recommended dietary changes: avoid fad  diets, make small/incremental dietary and exercise changes, eat at the table and avoid eating in front of the TV, plan management of cravings, monitor snacking and cravings in food diary Assessed social determinant of health barriers  Currently being followed by LCSW [place] [facility] - please refer to LCSW's Care Plan as well  Patient Self-Care Activities:  Attend all scheduled provider appointments Call pharmacy for medication refills 3-7 days in advance of running out of medications Call provider office for new concerns or questions  Perform all self care activities independently  Perform IADL's (shopping, preparing meals, housekeeping, managing finances) independently Take medications as prescribed   Work with the social worker to address care coordination needs and will continue to work with the clinical team to address health care and disease management related needs call office if I gain more than 2 pounds in one day or 5 pounds in one week do ankle pumps when sitting track weight in diary use salt in moderation watch for swelling in feet, ankles and legs every day weigh myself daily bring diary to all appointments develop a rescue plan follow rescue plan if symptoms flare-up eat more whole grains, fruits and vegetables, lean meats and healthy fats track symptoms and what helps feel better or worse dress right for the weather, hot or cold  Plan:  The patient has been provided with contact information for the care management team and has been advised to call with any health related questions or concerns.  Next Follow Up appointment with RN Care Manager Bartholomew Light is 07/25/23 at 3pm  Patient verbalizes understanding of instructions and care plan provided today and agrees to view in MyChart. Active MyChart status and patient understanding of how to access instructions and care plan via MyChart confirmed with patient.     The patient has been provided with contact  information for the care management team and has been advised to call with any health related questions or concerns.   Please call the care guide team at [phone] if you need to cancel or reschedule your appointment.   Please call 1-800-273-TALK (toll free, 24 hour hotline) if you are experiencing a Mental Health or Behavioral Health Crisis or need someone to talk to.  Kaytee Taliercio A. Saverio Curling RN, BA, [facility], CRRN [place]  [facility] Population Health RN Care Manager Direct Dial: [phone]  Fax: [phone]

## 2023-06-26 NOTE — Patient Instructions (Signed)
 What is bladder training?  Bladder training means changing your habits to better control your bladder. It can help with urinary problems like:  ?Frequency - This means having to use the toilet very frequently.  ?Urgency - This means suddenly feeling the need to urinate right away.  ?Leakage - This is when urgency leads to actually leaking urine. It is also called urge incontinence.  What is a bladder diary?  Bladder training involves trying to increase the amount of time between trips to the toilet. A bladder diary is a way to keep track of how often you go (form 1). Doctors sometimes call this a voiding diary.  In the diary, write down:  ?The time you urinate  ?The amount of urine, if your doctor asked you to measure this  ?If you had any leaking, how much (small, medium, or large amount), and what you were doing when the leakage happened  ?The amounts and types of fluids you drink  ?Any other symptoms you have   How do I train my bladder?  Once you have an idea of how often you are urinating, try to wait a little longer between trips to the toilet. This should be a gradual process:  ?First, slightly increase the amount of time between bathroom visits. For example, if you normally go every hour, add 15 minutes. This means trying to wait 1 hour and 15 minutes between trips to the bathroom.  ?Keep following this schedule for several days. If you can do this for 3 or 4 days without problems, increase the time again. For example, you can add 15 more minutes between trips to the bathroom.  ?Keep doing this until you can wait at least 2 to 3 hours between bathroom visits. It can take time to get to this point. Some people notice improvement within a few weeks of bladder training. But it can take longer.  Keep filling out your bladder diary as you work on bladder training. This will help you see improvement over time. The process might take several weeks.   What else can I do  to help with urgency?  Part of bladder training involves learning to control the urge to urinate and make your bladder wait. Some things you can do to help with this:  ?Squeeze your pelvic muscles - These include the muscles that control the flow of urine. Try squeezing the muscles and then relaxing them. Repeat this several times.  ?Change positions - It might help to cross your legs or sit on a firm surface.  ?Distract your mind - Try to think about something else until the urge passes.  ?Relax - Stay still when you get the urge to urinate. It might help to do deep breathing exercises. Do not rush to the toilet when it is time to go.   What else should I know?  These tips might help with bladder training:  ?Try to only urinate when you actually need to go - For example, avoid going to the bathroom before leaving home just in case. This can train your brain to think that your bladder is full when it really isn't.  ?Avoid drinking fluids soon before bedtime - This can lower the chances that you will need to urinate during the night.  ?Limit or avoid alcohol and caffeine - Some people find that these things make them need to urinate more.  When should I call the doctor?  Call your doctor or nurse if:  ?Your urinary symptoms are  not getting better or are getting worse.  ?You are having trouble with your training schedule - Bladder training can be frustrating and take time. But don't give up. Your doctor or nurse can help you.  ?You have other problems with urinating, such as pain or burning, not being able to urinate, or seeing blood in your urine.

## 2023-06-26 NOTE — Telephone Encounter (Signed)
 The left leg did have marginal saphenous reflux for intervention but last September did not seem to be related to her symptoms.  Angela Kell Me to Adine Hoof, MD       06/26/23  1:10 PM Any suggestions other than compression socks/hose? Mollie Anger to [facility] (supporting Adine Hoof, MD)       06/26/23 12:40 PM Good afternoon, Are there any options for venous insufficiency or my condition? I am having the same issue with my legs feeling very heavy and a lot of pain. Over a period of time its gotten worse and my heart rate continues to go up when standing. I have followed up with cardiology. It is to the point I can't stand very long or walk long periods of time.  Thanks.

## 2023-06-26 NOTE — Patient Outreach (Signed)
 Complex Care Management   Visit Note  06/25/2023  Name:  Yesenia Harper MRN: [id number] DOB: 09/08/1985  Situation: Referral received for Complex Care Management related to Heart Failure and Weight Management. I obtained verbal consent from Patient.  Visit completed with patient  on the phone.  Background:   Past Medical History:  Diagnosis Date   Abnormal uterine bleeding (AUB)    Anticoagulant long-term use    xalrelto--- managed by dr ennever/  Isa Manuel carter NP   Bilateral carpal tunnel syndrome 02/23/2011   Chiari malformation type I [facility])    s/p suboccipital craniectomy w/ chiari decompression 06-30-2021 by neurosurgeon dr d. Beatris Lincoln  @ [email or web address]   Chronic pain syndrome    followed by [facility]--- dr c. Candance Certain;     and myofascial pain of lumbar   Cognitive impairment, mild, so stated 05/23/2014   Congestive heart failure with right heart failure (HCC) 2022   followed by [facility] @ [facility]; right-sidedd;       pt referred to HF clinic when incidental RA pressure of 10 w/ relfux contrast in the hepatic veins was found at time of angioplasty/ stenting left common vein & left external iliac vein in 11/ 2022   DDD (degenerative disc disease), cervical    DDD (degenerative disc disease), lumbosacral    Edema of both lower extremities    GAD (generalized anxiety disorder)    History of DVT of lower extremity    2006 ---  LLE  completed coumadin  therapy;    and recurrent 12/ 2023 LLE non-occlusive thrombus within left common femoral vein   Hypothyroidism    Iron deficiency anemia due to chronic blood loss    May-Thurner syndrome 12/13/2020   DX 11/ 2022;    s/p median arcuate ligament release 04-05-2022   MDD (major depressive disorder)    Median arcuate ligament syndrome (HCC)    s/p  MALS surgical release 04-05-2022   Migraine without aura, intractable, without status migrainosus    neurologist--- mark pane PA;   treated w/ botox injections   Pelvic  congestion syndrome 11/2020   s/p  left ovarian vein coil embolization 11-17-2020   Peripheral vascular disease (HCC)    vascular--- dr Jacqulyne Maxim (lov in care everywhere 02-23-2022)   s/p  angioplasty/ stenting left common & left external iliac veins w/ overlapping   Scoliosis 12/18/2019   Sjogren syndrome with central nervous system involvement [facility])    rheumatologist--- dr a. Arlester Bence    Assessment: Patient Reported Symptoms:  Cognitive Cognitive Status: Alert and oriented to person, place, and time, Normal speech and language skills, Insightful and able to interpret abstract concepts Cognitive/Intellectual Conditions Management [RPT]: None reported or documented in medical history or problem list   Health Maintenance Behaviors: Annual physical exam, Hobbies, Stress management, Healthy diet Healing Pattern: Unsure Health Facilitated by: Stress management, Healthy diet  Neurological Neurological Review of Symptoms: No symptoms reported    HEENT HEENT Symptoms Reported: No symptoms reported      Cardiovascular Cardiovascular Symptoms Reported: Fatigue, Swelling in legs or feet Does patient have uncontrolled Hypertension?: No Cardiovascular Conditions:  (PMH - May-Thurner Syndrome, DVT of lower extremity, PVD, Migraine headaches without aura) Cardiovascular Management Strategies: Activity, Coping strategies, Medication therapy, Routine screening, Weight management, Adequate rest, Diet modification, Exercise Do You Have a Working Readable Scale?: Yes Weight: 173 lb (78.5 kg) Cardiovascular Self-Management Outcome: 3 (uncertain) Cardiovascular Comment: followed by [facility] for Cardiovascular management, currently wearing Holter monitor  6/16 for 3-17days  Respiratory Respiratory Symptoms Reported: No symptoms reported    Endocrine Patient reports the following symptoms related to hypoglycemia or hyperglycemia : No symptoms reported Is patient diabetic?:  No Endocrine Conditions: Thyroid  disorder, Other Other Endocrine Conditions: Hypothyroidism Endocrine Management Strategies: Medication therapy  Gastrointestinal Gastrointestinal Symptoms Reported: No symptoms reported   Nutrition Risk Screen (CP): No indicators present  Genitourinary Genitourinary Symptoms Reported: No symptoms reported Additional Genitourinary Details: Abnormal Uterine Bleeding (AUB) s/p recent laparoscopic hysterectomy - diuretics remain on pause due to hypotension post op Genitourinary Conditions: Other Other Genitourinary Conditions: AUB s/p lap hysterectomy Genitourinary Management Strategies: Adequate rest Genitourinary Self-Management Outcome: 4 (good)  Integumentary Integumentary Symptoms Reported: No symptoms reported    Musculoskeletal Musculoskelatal Symptoms Reviewed: No symptoms reported Additional Musculoskeletal Details: PMH of degenerative joint disease of cervical and lumbar spine and scoliosis Musculoskeletal Conditions: Scoliosis, Joint pain Musculoskeletal Management Strategies: Coping strategies, Medication therapy, Weight management, Exercise, Diet modification Falls in the past year?: No    Psychosocial Psychosocial Symptoms Reported: Depression - if selected complete PHQ 2-9 Behavioral Health Conditions: Depression, Anxiety Behavioral Management Strategies: Coping strategies, Counseling, Adequate rest, Medication therapy, Exercise, Support system Behavioral Health Comment: sees both a psychotherapist and psychiatrist with [facility] on a regular basis Major Change/Loss/Stressor/Fears (CP): Medical condition, self, Resources Behaviors When Feeling Stressed/Fearful: feeling overwhelmed Techniques to Cope with Loss/Stress/Change: Counseling, Diversional activities, Exercise, Medication Quality of Family Relationships: involved, helpful, supportive Do you feel physically threatened by others?: No      06/19/2023   11:45 AM  Depression screen PHQ 2/9   Decreased Interest 2  Down, Depressed, Hopeless 1  PHQ - 2 Score 3  Altered sleeping 3  Tired, decreased energy 2  Change in appetite 2  Feeling bad or failure about yourself  2  Trouble concentrating 1  Moving slowly or fidgety/restless 1  Suicidal thoughts 0  PHQ-9 Score 14  Difficult doing work/chores Somewhat difficult    There were no vitals filed for this visit.  Medications Reviewed Today     Reviewed by Randye Buttner, RN (Registered Nurse) on 06/25/23 at 1330  Med List Status: <None>   Medication Order Taking? Sig Documenting Provider Last Dose Status Informant  acetaminophen  (TYLENOL ) 500 MG tablet [id number] Yes Take 2 tablets (1,000 mg total) by mouth every 6 (six) hours. Lacey Pian, MD  Active   ALPRAZolam  (XANAX ) 0.25 MG tablet [id number] Yes Take 0.25 mg by mouth 3 (three) times daily as needed for anxiety. [provider]  Active Self  B-D 3CC LUER-LOK SYR 25GX1/2 25G X 1-1/2 3 ML MISC [id number] Yes Inject 1 each into the muscle once a week. [provider]  Active Self  BOTOX 200 units injection [id number] Yes Inject 200 Units into the muscle every 3 (three) months. This is done by neurologist --- treatment for migraines [provider]  Active Self  buPROPion  HCl ER, XL, 450 MG TB24 [id number] Yes Take 450 mg by mouth in the morning. [provider]  Active Self  busPIRone (BUSPAR) 5 MG tablet [id number] Yes Take 5 mg by mouth 2 (two) times daily. [provider]  Active   cyanocobalamin  (VITAMIN B12) 1000 MCG/ML injection [id number] Yes Inject 1,000 mcg into the muscle every Wednesday. [provider]  Active Self  diltiazem  (CARDIZEM  CD) 120 MG 24 hr capsule [id number] Yes Take 1 capsule (120 mg total) by mouth in the morning. Lacey Pian, MD  Active   etonogestrel  (NEXPLANON ) 68 MG IMPL implant  [id number]  Inject 1 each into the skin once. [provider]  Active Self           Med Note Almer [place] Nov 02, 2021 11:40 AM) Currently, implant feb 2023  folic acid  (FOLVITE ) 1 MG tablet [id number] Yes Take 1 tablet (1 mg total) by mouth daily. Sunnie England M, PA-C  Active Self  hydroxychloroquine  (PLAQUENIL ) 200 MG tablet [id number] Yes Take 400 mg by mouth daily. [provider]  Active Self  ibuprofen  (ADVIL ) 600 MG tablet [id number] Yes Take 1 tablet (600 mg total) by mouth every 6 (six) hours. Lacey Pian, MD  Active   lamoTRIgine  (LAMICTAL ) 25 MG tablet [id number] Yes Take 50 mg by mouth at bedtime. [provider]  Active Self  levothyroxine  (SYNTHROID ) 88 MCG tablet [id number] Yes Take 1 tablet (88 mcg total) by mouth daily. Cherre Cornish, NP  Active   linaclotide  (LINZESS ) 290 MCG CAPS capsule [id number] Yes Take 290 mcg by mouth daily. [provider]  Active Self  lurasidone  (LATUDA ) 40 MG TABS tablet [id number] Yes Take 40 mg by mouth every evening. [provider]  Active Self  meclizine  (ANTIVERT ) 25 MG tablet [id number] Yes Take 25 mg by mouth 3 (three) times daily as needed for dizziness. [provider]  Active Self  metFORMIN  (GLUCOPHAGE -XR) 500 MG 24 hr tablet [id number] Yes Take 1 tablet (500 mg total) by mouth daily with breakfast. Cherre Cornish, NP  Active   Multiple Vitamin (MULTIVITAMIN ADULT PO) [id number] Yes Take 1 tablet by mouth in the morning. [provider]  Active Self  NURTEC 75 MG TBDP [id number] Yes Take 75 mg by mouth daily as needed for migraine. [provider]  Active Self  omeprazole (PRILOSEC) 20 MG capsule [id number] Yes Take 20 mg by mouth daily as needed. [provider]  Active Self  ondansetron  (ZOFRAN -ODT) 8 MG disintegrating tablet [id number] Yes TAKE 1 TABLET BY MOUTH EVERY 8 HOURS AS NEEDED FOR NAUSEA Cirigliano, Vito V, DO  Active Self  oxyCODONE  (OXY IR/ROXICODONE ) 5 MG immediate release tablet [id number]  Take 1-2 tablets (5-10 mg total) by mouth every 4 (four) hours as  needed for moderate pain (pain score 4-6).  Patient not taking: Reported on 06/25/2023   Lacey Pian, MD  Active   promethazine  (PHENERGAN ) 12.5 MG tablet [id number] Yes Take 12.5 mg by mouth every 4 (four) hours as needed for nausea or vomiting. [provider]  Active Self  QULIPTA  60 MG TABS [id number] Yes Take 60 mg by mouth in the morning. [provider]  Active Self  spironolactone  (ALDACTONE ) 25 MG tablet [id number]  Take 25 mg by mouth in the morning. [provider]  Active Self  sulfamethoxazole -trimethoprim  (BACTRIM  DS) 800-160 MG tablet [id number]  Take 1 tablet by mouth 2 (two) times daily.  Patient not taking: Reported on 06/25/2023   Izell Marsh, MD  Active   Vilazodone  HCl (VIIBRYD ) 40 MG TABS [id number] Yes Take 1 tablet (40 mg total) by mouth daily. Cherre Cornish, NP  Active Self  Vitamin D, Ergocalciferol, (DRISDOL) 1.25 MG (50000 UNIT) CAPS capsule [id number] Yes Take 50,000 Units by mouth every Thursday. [provider]  Active Self  XARELTO  20 MG TABS tablet [id number] Yes TAKE 1 TABLET BY MOUTH DAILY WITH SUPPER. Adine Hoof, MD  Active Self  zolpidem  (AMBIEN ) 10 MG tablet [id number] Yes Take 10 mg by mouth at bedtime. [provider]  Active Self  Recommendation:   Specialty provider follow-up with Loralyn Rochester for 11 week post op (s/p laparascopic hysterectomy)  Follow Up Plan:   Telephone follow up appointment date/time:  07/25/23 at 3pm with RN Case Manager Barbra Ley A. Saverio Curling RN, BA, [facility], CRRN [place]  [facility] Population Health RN Care Manager Direct Dial: [phone]  Fax: [phone]

## 2023-06-26 NOTE — Telephone Encounter (Signed)
Pt scheduled for 08/13

## 2023-07-05 DIAGNOSIS — F3131 Bipolar disorder, current episode depressed, mild: Secondary | ICD-10-CM | POA: Diagnosis not present

## 2023-07-05 DIAGNOSIS — F411 Generalized anxiety disorder: Secondary | ICD-10-CM | POA: Diagnosis not present

## 2023-07-05 DIAGNOSIS — I50813 Acute on chronic right heart failure: Secondary | ICD-10-CM | POA: Diagnosis not present

## 2023-07-05 DIAGNOSIS — F4312 Post-traumatic stress disorder, chronic: Secondary | ICD-10-CM | POA: Diagnosis not present

## 2023-07-09 DIAGNOSIS — I774 Celiac artery compression syndrome: Secondary | ICD-10-CM | POA: Diagnosis not present

## 2023-07-09 DIAGNOSIS — K3184 Gastroparesis: Secondary | ICD-10-CM | POA: Diagnosis not present

## 2023-07-10 ENCOUNTER — Other Ambulatory Visit: Payer: Self-pay | Admitting: Licensed Clinical Social Worker

## 2023-07-10 ENCOUNTER — Ambulatory Visit: Admitting: Obstetrics and Gynecology

## 2023-07-10 ENCOUNTER — Telehealth: Payer: Self-pay | Admitting: *Deleted

## 2023-07-10 NOTE — Patient Instructions (Signed)
 Visit Information  Thank you for taking time to visit with me today. Please don't hesitate to contact me if I can be of assistance to you before our next scheduled appointment.  Your next care management appointment is by telephone on 08/14/2023.   Please call the care guide team at [phone] if you need to cancel, schedule, or reschedule an appointment.   Please call the Suicide and Crisis Lifeline: 988 if you are experiencing a Mental Health or Behavioral Health Crisis or need someone to talk to.  Alm Armor, LCSW [facility], [facility] Licensed Clinical Social Worker Care Coordinator [phone]

## 2023-07-10 NOTE — Patient Outreach (Signed)
 Complex Care Management   Visit Note  07/10/2023  Name:  Yesenia Harper MRN: [id number] DOB: 07-Jan-1986  Situation: Referral received for Complex Care Management related to Mental/Behavioral Health diagnosis MDD I obtained verbal consent from Patient.  Visit completed with patient  on the phone  Background:   Past Medical History:  Diagnosis Date   Abnormal uterine bleeding (AUB)    Anticoagulant long-term use    xalrelto--- managed by dr ennever/  lauraine carter NP   Bilateral carpal tunnel syndrome 02/23/2011   Chiari malformation type I [facility])    s/p suboccipital craniectomy w/ chiari decompression 06-30-2021 by neurosurgeon dr d. vona  @ [facility]   Chronic pain syndrome    followed by [facility]--- dr c. nancey;     and myofascial pain of lumbar   Cognitive impairment, mild, so stated 05/23/2014   Congestive heart failure with right heart failure (HCC) 2022   followed by [facility] @ [facility]; right-sidedd;       pt referred to HF clinic when incidental RA pressure of 10 w/ relfux contrast in the hepatic veins was found at time of angioplasty/ stenting left common vein & left external iliac vein in 11/ 2022   DDD (degenerative disc disease), cervical    DDD (degenerative disc disease), lumbosacral    Edema of both lower extremities    GAD (generalized anxiety disorder)    History of DVT of lower extremity    2006 ---  LLE  completed coumadin  therapy;    and recurrent 12/ 2023 LLE non-occlusive thrombus within left common femoral vein   Hypothyroidism    Iron deficiency anemia due to chronic blood loss    May-Thurner syndrome 12/13/2020   DX 11/ 2022;    s/p median arcuate ligament release 04-05-2022   MDD (major depressive disorder)    Median arcuate ligament syndrome (HCC)    s/p  MALS surgical release 04-05-2022   Migraine without aura, intractable, without status migrainosus    neurologist--- mark pane PA;   treated w/ botox injections   Pelvic  congestion syndrome 11/2020   s/p  left ovarian vein coil embolization 11-17-2020   Peripheral vascular disease (HCC)    vascular--- dr lois chihuahua ([facility] 02-23-2022)   s/p  angioplasty/ stenting left common & left external iliac veins w/ overlapping   Scoliosis 12/18/2019   Sjogren syndrome with central nervous system involvement [facility])    rheumatologist--- dr a. daphine    Assessment: Patient Reported Symptoms:  Cognitive Cognitive Status: Alert and oriented to person, place, and time, Normal speech and language skills, Insightful and able to interpret abstract concepts Cognitive/Intellectual Conditions Management [RPT]: None reported or documented in medical history or problem list   Health Maintenance Behaviors: Annual physical exam, Hobbies, Stress management Healing Pattern: Unsure Health Facilitated by: Stress management, Healthy diet  Neurological Neurological Review of Symptoms: No symptoms reported    HEENT HEENT Symptoms Reported: Not assessed      Cardiovascular Cardiovascular Symptoms Reported: Swelling in legs or feet, Fatigue Cardiovascular Management Strategies: Activity, Coping strategies, Medication therapy, Routine screening, Weight management Cardiovascular Comment: [facility]  Respiratory Respiratory Symptoms Reported: No symptoms reported    Endocrine Endocrine Symptoms Reported: No symptoms reported    Gastrointestinal Gastrointestinal Symptoms Reported: Abdominal pain or discomfort Additional Gastrointestinal Details: Pt reports she will have a procedure completed at the end of August that will help with her food digestion.      Genitourinary  Genitourinary Symptoms Reported: Not assessed    Integumentary Integumentary Symptoms Reported: Not assessed    Musculoskeletal Musculoskelatal Symptoms Reviewed: Not assessed        Psychosocial Psychosocial Symptoms Reported: Depression - if selected complete PHQ  2-9 Additional Psychological Details: Pt reports she is actively working with her psychiatrist and therpist regarding her depression. They recently increased her lamotriggine and she will be following up with psychiatry in the next two weeks. Reports ongoing depressive/anxiety symptoms related to disability hearing and living with chronic illness. Behavioral Management Strategies: Coping strategies, Counseling, Adequate rest, Medication therapy, Exercise, Support system Behavioral Health Self-Management Outcome: 3 (uncertain) Major Change/Loss/Stressor/Fears (CP): Medical condition, self, Resources Techniques to [place] with Loss/Stress/Change: Counseling, Diversional activities, Exercise, Medication Quality of Family Relationships: helpful, involved, supportive      06/19/2023   11:45 AM  Depression screen PHQ 2/9  Decreased Interest 2  Down, Depressed, Hopeless 1  PHQ - 2 Score 3  Altered sleeping 3  Tired, decreased energy 2  Change in appetite 2  Feeling bad or failure about yourself  2  Trouble concentrating 1  Moving slowly or fidgety/restless 1  Suicidal thoughts 0  PHQ-9 Score 14  Difficult doing work/chores Somewhat difficult    There were no vitals filed for this visit.  Medications Reviewed Today   Medications were not reviewed in this encounter     Recommendation:   Follow up with psychiatry and counseling at [facility]  Follow Up Plan:   Telephone follow up appointment date/time:  08/14/2023  Alm Armor, LCSW [place], [facility] Licensed Clinical Social Worker Care Coordinator [phone]

## 2023-07-10 NOTE — Telephone Encounter (Signed)
 Returned call from 1:19 PM. Left patient a message that appointment will be canceled for today and call to reschedule as requested.

## 2023-07-15 ENCOUNTER — Encounter: Payer: Self-pay | Admitting: Family

## 2023-07-17 ENCOUNTER — Encounter: Payer: Self-pay | Admitting: Medical-Surgical

## 2023-07-17 DIAGNOSIS — E66811 Obesity, class 1: Secondary | ICD-10-CM

## 2023-07-19 DIAGNOSIS — F411 Generalized anxiety disorder: Secondary | ICD-10-CM | POA: Diagnosis not present

## 2023-07-19 DIAGNOSIS — F4312 Post-traumatic stress disorder, chronic: Secondary | ICD-10-CM | POA: Diagnosis not present

## 2023-07-19 DIAGNOSIS — F3131 Bipolar disorder, current episode depressed, mild: Secondary | ICD-10-CM | POA: Diagnosis not present

## 2023-07-24 ENCOUNTER — Other Ambulatory Visit: Payer: Self-pay

## 2023-07-24 DIAGNOSIS — I774 Celiac artery compression syndrome: Secondary | ICD-10-CM | POA: Diagnosis not present

## 2023-07-25 ENCOUNTER — Other Ambulatory Visit: Payer: Self-pay

## 2023-07-26 ENCOUNTER — Encounter: Payer: Self-pay | Admitting: Vascular Surgery

## 2023-07-31 ENCOUNTER — Inpatient Hospital Stay: Admitting: Family

## 2023-07-31 ENCOUNTER — Inpatient Hospital Stay: Attending: Medical Oncology

## 2023-08-01 ENCOUNTER — Other Ambulatory Visit: Payer: Self-pay

## 2023-08-05 ENCOUNTER — Other Ambulatory Visit: Payer: Self-pay | Admitting: *Deleted

## 2023-08-05 DIAGNOSIS — I872 Venous insufficiency (chronic) (peripheral): Secondary | ICD-10-CM

## 2023-08-07 DIAGNOSIS — R935 Abnormal findings on diagnostic imaging of other abdominal regions, including retroperitoneum: Secondary | ICD-10-CM | POA: Diagnosis not present

## 2023-08-07 DIAGNOSIS — I774 Celiac artery compression syndrome: Secondary | ICD-10-CM | POA: Diagnosis not present

## 2023-08-09 DIAGNOSIS — K3184 Gastroparesis: Secondary | ICD-10-CM | POA: Diagnosis not present

## 2023-08-13 ENCOUNTER — Institutional Professional Consult (permissible substitution): Admitting: Bariatrics

## 2023-08-13 DIAGNOSIS — K3184 Gastroparesis: Secondary | ICD-10-CM | POA: Diagnosis not present

## 2023-08-14 ENCOUNTER — Other Ambulatory Visit: Payer: Self-pay | Admitting: Licensed Clinical Social Worker

## 2023-08-14 NOTE — Patient Outreach (Signed)
 Complex Care Management   Visit Note  08/14/2023  Name:  Yesenia Harper MRN: [id number] DOB: [birth year]  Situation: Referral received for Complex Care Management related to Mental/Behavioral Health diagnosis MDD I obtained verbal consent from Patient.  Visit completed with patient  on the phone  Background:   Past Medical History:  Diagnosis Date   Abnormal uterine bleeding (AUB)    Anticoagulant long-term use    xalrelto--- managed by dr ennever/  lauraine carter NP   Bilateral carpal tunnel syndrome 02/23/2011   Chiari malformation type I [facility])    s/p suboccipital craniectomy w/ chiari decompression 06-30-2021 by neurosurgeon dr d. vona  @ [facility]   Chronic pain syndrome    followed by [facility]--- dr c. nancey;     and myofascial pain of lumbar   Cognitive impairment, mild, so stated 05/23/2014   Congestive heart failure with right heart failure (HCC) 2022   followed by [facility] @ [facility]; right-sidedd;       pt referred to HF clinic when incidental RA pressure of 10 w/ relfux contrast in the hepatic veins was found at time of angioplasty/ stenting left common vein & left external iliac vein in 11/ 2022   DDD (degenerative disc disease), cervical    DDD (degenerative disc disease), lumbosacral    Edema of both lower extremities    GAD (generalized anxiety disorder)    History of DVT of lower extremity    2006 ---  LLE  completed coumadin  therapy;    and recurrent 12/ 2023 LLE non-occlusive thrombus within left common femoral vein   Hypothyroidism    Iron deficiency anemia due to chronic blood loss    May-Thurner syndrome 12/13/2020   DX 11/ 2022;    s/p median arcuate ligament release 04-05-2022   MDD (major depressive disorder)    Median arcuate ligament syndrome (HCC)    s/p  MALS surgical release 04-05-2022   Migraine without aura, intractable, without status migrainosus    neurologist--- mark pane PA;   treated w/ botox injections   Pelvic  congestion syndrome 11/2020   s/p  left ovarian vein coil embolization 11-17-2020   Peripheral vascular disease (HCC)    vascular--- dr lois chihuahua ([facility] in care everywhere 02-23-2022)   s/p  angioplasty/ stenting left common & left external iliac veins w/ overlapping   Scoliosis 12/18/2019   Sjogren syndrome with central nervous system involvement [facility])    rheumatologist--- dr a. daphine    Assessment: Patient Reported Symptoms:  Cognitive Cognitive Status: Alert and oriented to person, place, and time, Normal speech and language skills, Insightful and able to interpret abstract concepts Cognitive/Intellectual Conditions Management [RPT]: None reported or documented in medical history or problem list   Health Maintenance Behaviors: Annual physical exam, Hobbies, Stress management Healing Pattern: Unsure Health Facilitated by: Stress management, Healthy diet  Neurological Neurological Review of Symptoms: No symptoms reported    HEENT HEENT Symptoms Reported: No symptoms reported      Cardiovascular Cardiovascular Symptoms Reported: Swelling in legs or feet, Fatigue Does patient have uncontrolled Hypertension?: No Cardiovascular Management Strategies: Activity, Coping strategies, Medication therapy, Routine screening, Weight management Do You Have a Working Readable Scale?: Yes Cardiovascular Self-Management Outcome: 3 (uncertain) Cardiovascular Comment: [facility]  Respiratory Respiratory Symptoms Reported: No symptoms reported    Endocrine Endocrine Symptoms Reported: No symptoms reported Is patient diabetic?: No    Gastrointestinal Gastrointestinal Symptoms Reported: Abdominal pain or discomfort Additional Gastrointestinal Details: Pt reports  she will have a procedure completed at the end of August that will help with her food digestion      Genitourinary Genitourinary Symptoms Reported: Not assessed    Integumentary Integumentary Symptoms Reported: Not  assessed    Musculoskeletal Musculoskelatal Symptoms Reviewed: Not assessed Additional Musculoskeletal Details: PMH of degenerative joint disease of cervical and lumbar spine and scoliosis Musculoskeletal Management Strategies: Coping strategies, Medication therapy, Weight management, Exercise, Diet modification      Psychosocial Psychosocial Symptoms Reported: Depression - if selected complete PHQ 2-9 Additional Psychological Details: Pt reports she is working with therapist and psychiatrist regarding depressive symptoms. Reports overall improvement in symptoms and she is enjoying relationship she's built with her MH providers - denied any further needs from CSW at this time. Behavioral Management Strategies: Coping strategies, Counseling, Medication therapy Behavioral Health Self-Management Outcome: 4 (good)          06/19/2023   11:45 AM  Depression screen PHQ 2/9  Decreased Interest 2  Down, Depressed, Hopeless 1  PHQ - 2 Score 3  Altered sleeping 3  Tired, decreased energy 2  Change in appetite 2  Feeling bad or failure about yourself  2  Trouble concentrating 1  Moving slowly or fidgety/restless 1  Suicidal thoughts 0  PHQ-9 Score 14  Difficult doing work/chores Somewhat difficult    There were no vitals filed for this visit.  Medications Reviewed Today   Medications were not reviewed in this encounter     Recommendation:   Pt reports she is working with therapist and psychiatrist regarding depressive symptoms. Reports overall improvement in symptoms and she is enjoying relationship she's built with her MH providers - denied any further needs from CSW at this time. CSW encouraged her to reach out to provider with any changes.   Follow Up Plan:   Patient has met all care management goals. Care Management case will be closed. Patient has been provided contact information should new needs arise.   Alm Armor, LCSW [place], [facility] Licensed Clinical Social Worker Care Coordinator [phone]

## 2023-08-14 NOTE — Patient Instructions (Signed)
 Visit Information  Thank you for taking time to visit with me today. Please don't hesitate to contact me if I can be of assistance to you before our next scheduled appointment.    Please call the care guide team at [phone] if you need to cancel, schedule, or reschedule an appointment.   Please call the Suicide and Crisis Lifeline: 988 if you are experiencing a Mental Health or Behavioral Health Crisis or need someone to talk to.  Hale Level, LCSW [place], [facility] Licensed Clinical Social Worker Care Coordinator [phone]

## 2023-08-19 ENCOUNTER — Inpatient Hospital Stay: Attending: Medical Oncology

## 2023-08-19 ENCOUNTER — Inpatient Hospital Stay (HOSPITAL_BASED_OUTPATIENT_CLINIC_OR_DEPARTMENT_OTHER): Admitting: Family

## 2023-08-19 VITALS — BP 102/73 | HR 98 | Temp 98.2°F | Resp 19 | Wt 167.8 lb

## 2023-08-19 DIAGNOSIS — N920 Excessive and frequent menstruation with regular cycle: Secondary | ICD-10-CM | POA: Diagnosis not present

## 2023-08-19 DIAGNOSIS — Z86718 Personal history of other venous thrombosis and embolism: Secondary | ICD-10-CM | POA: Diagnosis not present

## 2023-08-19 DIAGNOSIS — D509 Iron deficiency anemia, unspecified: Secondary | ICD-10-CM | POA: Diagnosis present

## 2023-08-19 DIAGNOSIS — F411 Generalized anxiety disorder: Secondary | ICD-10-CM | POA: Diagnosis not present

## 2023-08-19 DIAGNOSIS — F3131 Bipolar disorder, current episode depressed, mild: Secondary | ICD-10-CM | POA: Diagnosis not present

## 2023-08-19 DIAGNOSIS — F4312 Post-traumatic stress disorder, chronic: Secondary | ICD-10-CM | POA: Diagnosis not present

## 2023-08-19 DIAGNOSIS — D500 Iron deficiency anemia secondary to blood loss (chronic): Secondary | ICD-10-CM

## 2023-08-19 DIAGNOSIS — D6859 Other primary thrombophilia: Secondary | ICD-10-CM | POA: Diagnosis not present

## 2023-08-19 DIAGNOSIS — Z7901 Long term (current) use of anticoagulants: Secondary | ICD-10-CM | POA: Diagnosis not present

## 2023-08-19 DIAGNOSIS — D649 Anemia, unspecified: Secondary | ICD-10-CM

## 2023-08-19 LAB — CMP (CANCER CENTER ONLY)
ALT: 35 U/L (ref 0–44)
AST: 22 U/L (ref 15–41)
Albumin: 4.8 g/dL (ref 3.5–5.0)
Alkaline Phosphatase: 94 U/L (ref 38–126)
Anion gap: 14 (ref 5–15)
BUN: 12 mg/dL (ref 6–20)
CO2: 23 mmol/L (ref 22–32)
Calcium: 9.8 mg/dL (ref 8.9–10.3)
Chloride: 103 mmol/L (ref 98–111)
Creatinine: 0.9 mg/dL (ref 0.44–1.00)
GFR, Estimated: >60 mL/min (ref >60)
Glucose, Bld: 94 mg/dL (ref 70–99)
Potassium: 4.2 mmol/L (ref 3.5–5.1)
Sodium: 140 mmol/L (ref 135–145)
Total Bilirubin: 0.3 mg/dL (ref 0.0–1.2)
Total Protein: 7.4 g/dL (ref 6.5–8.1)

## 2023-08-19 LAB — CBC WITH DIFFERENTIAL (CANCER CENTER ONLY)
Abs Immature Granulocytes: 0.03 K/uL (ref 0.00–0.07)
Basophils Absolute: 0.1 K/uL (ref 0.0–0.1)
Basophils Relative: 1 %
Eosinophils Absolute: 0.1 K/uL (ref 0.0–0.5)
Eosinophils Relative: 1 %
HCT: 44.9 % (ref 36.0–46.0)
Hemoglobin: 15.1 g/dL — ABNORMAL HIGH (ref 12.0–15.0)
Immature Granulocytes: 0 %
Lymphocytes Relative: 23 %
Lymphs Abs: 2.2 K/uL (ref 0.7–4.0)
MCH: 31.2 pg (ref 26.0–34.0)
MCHC: 33.6 g/dL (ref 30.0–36.0)
MCV: 92.8 fL (ref 80.0–100.0)
Monocytes Absolute: 0.8 K/uL (ref 0.1–1.0)
Monocytes Relative: 8 %
Neutro Abs: 6.4 K/uL (ref 1.7–7.7)
Neutrophils Relative %: 67 %
Platelet Count: 420 K/uL — ABNORMAL HIGH (ref 150–400)
RBC: 4.84 MIL/uL (ref 3.87–5.11)
RDW: 12.3 % (ref 11.5–15.5)
WBC Count: 9.6 K/uL (ref 4.0–10.5)
nRBC: 0 % (ref 0.0–0.2)

## 2023-08-19 LAB — IRON AND IRON BINDING CAPACITY (CC-WL,HP ONLY)
Iron: 73 ug/dL (ref 28–170)
Saturation Ratios: 24 % (ref 10.4–31.8)
TIBC: 300 ug/dL (ref 250–450)
UIBC: 227 ug/dL

## 2023-08-19 LAB — RETICULOCYTES
Immature Retic Fract: 1.8 % — ABNORMAL LOW (ref 2.3–15.9)
RBC.: 4.83 MIL/uL (ref 3.87–5.11)
Retic Count, Absolute: 56 K/uL (ref 19.0–186.0)
Retic Ct Pct: 1.2 % (ref 0.4–3.1)

## 2023-08-19 LAB — FERRITIN: Ferritin: 160 ng/mL (ref 11–307)

## 2023-08-19 NOTE — Progress Notes (Addendum)
 Hematology and Oncology Follow Up Visit  [name] [id number] 1985/05/17 38 y.o. 08/19/2023   Principle Diagnosis:  May-Thurner Syndrome Recurrent DVT in the left lower extremity Iron deficiency anemia     Current Therapy:        Xarelto  20 mg PO daily- adjusted to 10 mg on 02/20/2023 due to bleeding - stopped due to heavy menses 03/05/2023 Tranfusional support as needed IV iron as indicated B12 IM weekly   Interim History:  [name] is today with her husband and sweet daughter for follow-up. She is not feeling well. She states that a few days ago she started having a headache with n/v, fatigue and dizziness.  Appetite has been poor. She is doing her best to stay well hydrated.  Weight is stable at 167 lbs.  No fever, chills, rash, SOB, chest pain, palpitations, abdominal pain or changes in bowel or bladder habits.  She is on Xarelto  20 mg PO daily.  She has several healing bruises on the left thigh and leg with small hematomas.  No blood loss noted. No petechiae.  No swelling in her extremities. No numbness or tingling.  No falls or syncope.   ECOG Performance Status: 1 - Symptomatic but completely ambulatory  Medications:  Allergies as of 08/19/2023       Reactions   Penicillins Itching, Rash   Patient feels itching in mouth.        Medication List        Accurate as of August 19, 2023  4:13 PM. If you have any questions, ask your nurse or doctor.          PAUSE taking these medications    spironolactone  25 MG tablet Wait to take this until your doctor or other care provider tells you to start again. Commonly known as: ALDACTONE  Take 25 mg by mouth in the morning.       TAKE these medications    Acetaminophen  Extra Strength 500 MG Tabs Take 2 tablets (1,000 mg total) by mouth every 6 (six) hours.   ALPRAZolam  0.25 MG tablet Commonly known as: XANAX  Take 0.25 mg by mouth 3 (three) times daily as needed for anxiety.   B-D 3CC LUER-LOK SYR  25GX1/2 25G X 1-1/2 3 ML Misc Generic drug: SYRINGE-NEEDLE (DISP) 3 ML Inject 1 each into the muscle once a week.   Botox 200 units injection Generic drug: botulinum toxin Type A Inject 200 Units into the muscle every 3 (three) months. This is done by neurologist --- treatment for migraines   buPROPion  HCl ER (XL) 450 MG Tb24 Take 450 mg by mouth in the morning.   busPIRone 5 MG tablet Commonly known as: BUSPAR Take 5 mg by mouth 2 (two) times daily.   cyanocobalamin  1000 MCG/ML injection Commonly known as: VITAMIN B12 Inject 1,000 mcg into the muscle every Wednesday.   diltiazem  120 MG 24 hr capsule Commonly known as: CARDIZEM  CD Take 1 capsule (120 mg total) by mouth in the morning.   etonogestrel  68 MG Impl implant Commonly known as: NEXPLANON  Inject 1 each into the skin once.   folic acid  1 MG tablet Commonly known as: FOLVITE  Take 1 tablet (1 mg total) by mouth daily.   hydroxychloroquine  200 MG tablet Commonly known as: PLAQUENIL  Take 400 mg by mouth daily.   ibuprofen  600 MG tablet Commonly known as: ADVIL  Take 1 tablet (600 mg total) by mouth every 6 (six) hours.   lamoTRIgine  25 MG tablet Commonly known as: LAMICTAL  Take 50  mg by mouth at bedtime.   levothyroxine  88 MCG tablet Commonly known as: SYNTHROID  Take 1 tablet (88 mcg total) by mouth daily.   linaclotide  290 MCG Caps capsule Commonly known as: LINZESS  Take 290 mcg by mouth daily.   lurasidone  40 MG Tabs tablet Commonly known as: LATUDA  Take 40 mg by mouth every evening.   meclizine  25 MG tablet Commonly known as: ANTIVERT  Take 25 mg by mouth 3 (three) times daily as needed for dizziness.   metFORMIN  500 MG 24 hr tablet Commonly known as: GLUCOPHAGE -XR Take 1 tablet (500 mg total) by mouth daily with breakfast.   MULTIVITAMIN ADULT PO Take 1 tablet by mouth in the morning.   Nurtec 75 MG Tbdp Generic drug: Rimegepant Sulfate Take 75 mg by mouth daily as needed for migraine.    omeprazole 20 MG capsule Commonly known as: PRILOSEC Take 20 mg by mouth daily as needed.   ondansetron  8 MG disintegrating tablet Commonly known as: ZOFRAN -ODT TAKE 1 TABLET BY MOUTH EVERY 8 HOURS AS NEEDED FOR NAUSEA   promethazine  12.5 MG tablet Commonly known as: PHENERGAN  Take 12.5 mg by mouth every 4 (four) hours as needed for nausea or vomiting.   Qulipta  60 MG Tabs Generic drug: Atogepant  Take 60 mg by mouth in the morning.   Vilazodone  HCl 40 MG Tabs Commonly known as: Viibryd  Take 1 tablet (40 mg total) by mouth daily.   Vitamin D (Ergocalciferol) 1.25 MG (50000 UNIT) Caps capsule Commonly known as: DRISDOL Take 50,000 Units by mouth every Thursday.   Xarelto  20 MG Tabs tablet Generic drug: rivaroxaban  TAKE 1 TABLET BY MOUTH DAILY WITH SUPPER.   zolpidem  10 MG tablet Commonly known as: AMBIEN  Take 10 mg by mouth at bedtime.        Allergies:  Allergies  Allergen Reactions   Penicillins Itching and Rash    Patient feels itching in mouth.    Past Medical History, Surgical history, Social history, and Family History were reviewed and updated.  Review of Systems: All other 10 point review of systems is negative.   Physical Exam:  weight is 167 lb 12.8 oz (76.1 kg). Her oral temperature is 98.2 F (36.8 C). Her blood pressure is 102/73 and her pulse is 98. Her respiration is 19 and oxygen saturation is 98%.   Wt Readings from Last 3 Encounters:  08/19/23 167 lb 12.8 oz (76.1 kg)  06/26/23 167 lb (75.8 kg)  06/26/23 173 lb (78.5 kg)    Ocular: Sclerae unicteric, pupils equal, round and reactive to light Ear-nose-throat: Oropharynx clear, dentition fair Lymphatic: No cervical or supraclavicular adenopathy Lungs no rales or rhonchi, good excursion bilaterally Heart regular rate and rhythm, no murmur appreciated Abd soft, nontender, positive bowel sounds MSK no focal spinal tenderness, no joint edema Neuro: non-focal, well-oriented, appropriate  affect Breasts: Deferred   Lab Results  Component Value Date   WBC 9.6 08/19/2023   HGB 15.1 (H) 08/19/2023   HCT 44.9 08/19/2023   MCV 92.8 08/19/2023   PLT 420 (H) 08/19/2023   Lab Results  Component Value Date   FERRITIN 190 05/01/2023   IRON 95 05/01/2023   TIBC 283 05/01/2023   UIBC 188 05/01/2023   IRONPCTSAT 34 (H) 05/01/2023   Lab Results  Component Value Date   RETICCTPCT 1.2 08/19/2023   RBC 4.84 08/19/2023   No results found for: KPAFRELGTCHN, LAMBDASER, KAPLAMBRATIO No results found for: IGGSERUM, IGA, IGMSERUM No results found for: TOTALPROTELP, ALBUMINELP, A1GS, A2GS, BETS, BETA2SER, GAMS,  MSPIKE, SPEI   Chemistry      Component Value Date/Time   NA 140 08/19/2023 1448   NA 138 04/16/2022 0000   K 4.2 08/19/2023 1448   CL 103 08/19/2023 1448   CO2 23 08/19/2023 1448   BUN 12 08/19/2023 1448   BUN 13 04/16/2022 0000   CREATININE 0.90 08/19/2023 1448   CREATININE 0.78 05/12/2021 0000   GLU 102 04/16/2022 0000      Component Value Date/Time   CALCIUM 9.8 08/19/2023 1448   ALKPHOS 94 08/19/2023 1448   AST 22 08/19/2023 1448   ALT 35 08/19/2023 1448   BILITOT 0.3 08/19/2023 1448       Impression and Plan: Ms. Shands is a very pleasant 38 yo caucasian female with history recurrent left lower extremity DVT and [name]. Hyper coag panel was negative. Her most recent DVT was in Dec 2023.  Patient encouraged to follow-up with PCP or UC to be assessed for possible flu.  Iron studies are pending. We will replace if needed.  She is back on Xarelto  20 mg PO daily.  Follow-up in 4 months.    Lauraine Pepper, NP 8/11/20254:13 PM

## 2023-08-19 NOTE — Progress Notes (Addendum)
 Hematology and Oncology Follow Up Visit  Yesenia Harper [id number] 1985/06/06 38 y.o. 08/19/2023   Principle Diagnosis:  May-Thurner Syndrome Recurrent DVT in the left lower extremity Iron deficiency anemia     Current Therapy:        Xarelto  20 mg PO daily- adjusted to 10 mg on 02/20/2023 due to bleeding - stopped due to heavy menses 03/05/2023 Tranfusional support as needed IV iron as indicated B12 IM weekly   Interim History:  Yesenia Harper is today with her husband and sweet daughter for follow-up. She is not feeling well. She states that a few days ago she started having a headache with n/v, fatigue and dizziness.  Appetite has been poor. She is doing her best to stay well hydrated.  Weight is stable at 167 lbs.  No fever, chills, rash, SOB, chest pain, palpitations, abdominal pain or changes in bowel or bladder habits.  She is on Xarelto  20 mg PO daily.  She has several healing bruises on the left thigh and leg with small hematomas.  No blood loss noted. No petechiae.  No swelling in her extremities. No numbness or tingling.  No falls or syncope.   ECOG Performance Status: 1 - Symptomatic but completely ambulatory  Medications:  Allergies as of 08/19/2023       Reactions   Penicillins Itching, Rash   Patient feels itching in mouth.        Medication List        Accurate as of August 19, 2023  4:13 PM. If you have any questions, ask your nurse or doctor.          PAUSE taking these medications    spironolactone  25 MG tablet Wait to take this until your doctor or other care provider tells you to start again. Commonly known as: ALDACTONE  Take 25 mg by mouth in the morning.       TAKE these medications    Acetaminophen  Extra Strength 500 MG Tabs Take 2 tablets (1,000 mg total) by mouth every 6 (six) hours.   ALPRAZolam  0.25 MG tablet Commonly known as: XANAX  Take 0.25 mg by mouth 3 (three) times daily as needed for anxiety.   B-D 3CC LUER-LOK SYR  25GX1/2 25G X 1-1/2 3 ML Misc Generic drug: SYRINGE-NEEDLE (DISP) 3 ML Inject 1 each into the muscle once a week.   Botox 200 units injection Generic drug: botulinum toxin Type A Inject 200 Units into the muscle every 3 (three) months. This is done by neurologist --- treatment for migraines   buPROPion  HCl ER (XL) 450 MG Tb24 Take 450 mg by mouth in the morning.   busPIRone 5 MG tablet Commonly known as: BUSPAR Take 5 mg by mouth 2 (two) times daily.   cyanocobalamin  1000 MCG/ML injection Commonly known as: VITAMIN B12 Inject 1,000 mcg into the muscle every Wednesday.   diltiazem  120 MG 24 hr capsule Commonly known as: CARDIZEM  CD Take 1 capsule (120 mg total) by mouth in the morning.   etonogestrel  68 MG Impl implant Commonly known as: NEXPLANON  Inject 1 each into the skin once.   folic acid  1 MG tablet Commonly known as: FOLVITE  Take 1 tablet (1 mg total) by mouth daily.   hydroxychloroquine  200 MG tablet Commonly known as: PLAQUENIL  Take 400 mg by mouth daily.   ibuprofen  600 MG tablet Commonly known as: ADVIL  Take 1 tablet (600 mg total) by mouth every 6 (six) hours.   lamoTRIgine  25 MG tablet Commonly known as: LAMICTAL  Take 50  mg by mouth at bedtime.   levothyroxine  88 MCG tablet Commonly known as: SYNTHROID  Take 1 tablet (88 mcg total) by mouth daily.   linaclotide  290 MCG Caps capsule Commonly known as: LINZESS  Take 290 mcg by mouth daily.   lurasidone  40 MG Tabs tablet Commonly known as: LATUDA  Take 40 mg by mouth every evening.   meclizine  25 MG tablet Commonly known as: ANTIVERT  Take 25 mg by mouth 3 (three) times daily as needed for dizziness.   metFORMIN  500 MG 24 hr tablet Commonly known as: GLUCOPHAGE -XR Take 1 tablet (500 mg total) by mouth daily with breakfast.   MULTIVITAMIN ADULT PO Take 1 tablet by mouth in the morning.   Nurtec 75 MG Tbdp Generic drug: Rimegepant Sulfate Take 75 mg by mouth daily as needed for migraine.    omeprazole 20 MG capsule Commonly known as: PRILOSEC Take 20 mg by mouth daily as needed.   ondansetron  8 MG disintegrating tablet Commonly known as: ZOFRAN -ODT TAKE 1 TABLET BY MOUTH EVERY 8 HOURS AS NEEDED FOR NAUSEA   promethazine  12.5 MG tablet Commonly known as: PHENERGAN  Take 12.5 mg by mouth every 4 (four) hours as needed for nausea or vomiting.   Qulipta  60 MG Tabs Generic drug: Atogepant  Take 60 mg by mouth in the morning.   Vilazodone  HCl 40 MG Tabs Commonly known as: Viibryd  Take 1 tablet (40 mg total) by mouth daily.   Vitamin D (Ergocalciferol) 1.25 MG (50000 UNIT) Caps capsule Commonly known as: DRISDOL Take 50,000 Units by mouth every Thursday.   Xarelto  20 MG Tabs tablet Generic drug: rivaroxaban  TAKE 1 TABLET BY MOUTH DAILY WITH SUPPER.   zolpidem  10 MG tablet Commonly known as: AMBIEN  Take 10 mg by mouth at bedtime.        Allergies:  Allergies  Allergen Reactions   Penicillins Itching and Rash    Patient feels itching in mouth.    Past Medical History, Surgical history, Social history, and Family History were reviewed and updated.  Review of Systems: All other 10 point review of systems is negative.   Physical Exam:  weight is 167 lb 12.8 oz (76.1 kg). Her oral temperature is 98.2 F (36.8 C). Her blood pressure is 102/73 and her pulse is 98. Her respiration is 19 and oxygen saturation is 98%.   Wt Readings from Last 3 Encounters:  08/19/23 167 lb 12.8 oz (76.1 kg)  06/26/23 167 lb (75.8 kg)  06/26/23 173 lb (78.5 kg)    Ocular: Sclerae unicteric, pupils equal, round and reactive to light Ear-nose-throat: Oropharynx clear, dentition fair Lymphatic: No cervical or supraclavicular adenopathy Lungs no rales or rhonchi, good excursion bilaterally Heart regular rate and rhythm, no murmur appreciated Abd soft, nontender, positive bowel sounds MSK no focal spinal tenderness, no joint edema Neuro: non-focal, well-oriented, appropriate  affect Breasts: Deferred   Lab Results  Component Value Date   WBC 9.6 08/19/2023   HGB 15.1 (H) 08/19/2023   HCT 44.9 08/19/2023   MCV 92.8 08/19/2023   PLT 420 (H) 08/19/2023   Lab Results  Component Value Date   FERRITIN 190 05/01/2023   IRON 95 05/01/2023   TIBC 283 05/01/2023   UIBC 188 05/01/2023   IRONPCTSAT 34 (H) 05/01/2023   Lab Results  Component Value Date   RETICCTPCT 1.2 08/19/2023   RBC 4.84 08/19/2023   No results found for: KPAFRELGTCHN, LAMBDASER, KAPLAMBRATIO No results found for: IGGSERUM, IGA, IGMSERUM No results found for: TOTALPROTELP, ALBUMINELP, A1GS, A2GS, BETS, BETA2SER, GAMS,  MSPIKE, SPEI   Chemistry      Component Value Date/Time   NA 140 08/19/2023 1448   NA 138 04/16/2022 0000   K 4.2 08/19/2023 1448   CL 103 08/19/2023 1448   CO2 23 08/19/2023 1448   BUN 12 08/19/2023 1448   BUN 13 04/16/2022 0000   CREATININE 0.90 08/19/2023 1448   CREATININE 0.78 05/12/2021 0000   GLU 102 04/16/2022 0000      Component Value Date/Time   CALCIUM 9.8 08/19/2023 1448   ALKPHOS 94 08/19/2023 1448   AST 22 08/19/2023 1448   ALT 35 08/19/2023 1448   BILITOT 0.3 08/19/2023 1448       Impression and Plan: Yesenia Harper is a very pleasant 38 yo caucasian female with history recurrent left lower extremity DVT and [name]. Hyper coag panel was negative. Her most recent DVT was in Dec 2023.  Patient encouraged to follow-up with PCP or UC to be assessed for possible flu.  Iron studies are pending. We will replace if needed.  She is back on Xarelto  20 mg PO daily.  Follow-up in 4 months.    Lauraine Pepper, NP 8/11/20254:13 PM

## 2023-08-21 ENCOUNTER — Ambulatory Visit: Attending: Vascular Surgery | Admitting: Vascular Surgery

## 2023-08-21 ENCOUNTER — Ambulatory Visit (HOSPITAL_COMMUNITY): Attending: Medical-Surgical

## 2023-08-29 DIAGNOSIS — G5603 Carpal tunnel syndrome, bilateral upper limbs: Secondary | ICD-10-CM | POA: Diagnosis not present

## 2023-08-29 DIAGNOSIS — G4700 Insomnia, unspecified: Secondary | ICD-10-CM | POA: Diagnosis not present

## 2023-08-29 DIAGNOSIS — M545 Low back pain, unspecified: Secondary | ICD-10-CM | POA: Diagnosis not present

## 2023-08-29 DIAGNOSIS — G8929 Other chronic pain: Secondary | ICD-10-CM | POA: Diagnosis not present

## 2023-08-29 DIAGNOSIS — M5417 Radiculopathy, lumbosacral region: Secondary | ICD-10-CM | POA: Diagnosis not present

## 2023-08-29 DIAGNOSIS — G43719 Chronic migraine without aura, intractable, without status migrainosus: Secondary | ICD-10-CM | POA: Diagnosis not present

## 2023-08-29 DIAGNOSIS — M542 Cervicalgia: Secondary | ICD-10-CM | POA: Diagnosis not present

## 2023-08-29 DIAGNOSIS — G43109 Migraine with aura, not intractable, without status migrainosus: Secondary | ICD-10-CM | POA: Diagnosis not present

## 2023-08-29 DIAGNOSIS — M5412 Radiculopathy, cervical region: Secondary | ICD-10-CM | POA: Diagnosis not present

## 2023-08-30 DIAGNOSIS — K3189 Other diseases of stomach and duodenum: Secondary | ICD-10-CM | POA: Diagnosis not present

## 2023-09-02 ENCOUNTER — Institutional Professional Consult (permissible substitution): Admitting: Bariatrics

## 2023-09-03 DIAGNOSIS — G43719 Chronic migraine without aura, intractable, without status migrainosus: Secondary | ICD-10-CM | POA: Diagnosis not present

## 2023-09-03 DIAGNOSIS — K3184 Gastroparesis: Secondary | ICD-10-CM | POA: Diagnosis not present

## 2023-09-16 ENCOUNTER — Encounter: Payer: Self-pay | Admitting: Gastroenterology

## 2023-09-19 ENCOUNTER — Ambulatory Visit: Admitting: Medical-Surgical

## 2023-09-19 ENCOUNTER — Telehealth: Payer: Self-pay | Admitting: *Deleted

## 2023-09-19 NOTE — Progress Notes (Signed)
 Complex Care Management Care Guide Note  09/19/2023 Name: Yesenia Harper MRN: [id number] DOB: 1985/04/01  Yesenia Harper is a 38 y.o. year old female who is a primary care patient of Willo Mini, NP and is actively engaged with the care management team. I reached out to Montie Fish by phone today to assist with re-scheduling  with the RN Case Manager.  Follow up plan: pt declined to reschedule at this time - contact info given if services needed in the future   Thedford Franks, CMA [facility]  [facility], [facility] Guide Direct Dial: [phone]  Fax: [phone] Website: [email or web address]

## 2023-10-02 ENCOUNTER — Encounter: Payer: Self-pay | Admitting: Family

## 2023-10-02 DIAGNOSIS — D500 Iron deficiency anemia secondary to blood loss (chronic): Secondary | ICD-10-CM

## 2023-10-02 MED ORDER — FOLIC ACID 1 MG PO TABS: 1.0000 mg | ORAL_TABLET | Freq: Every day | ORAL | 6 refills | Status: AC

## 2023-10-17 ENCOUNTER — Encounter: Payer: Self-pay | Admitting: Family

## 2023-10-23 ENCOUNTER — Encounter: Payer: Self-pay | Admitting: Vascular Surgery

## 2023-10-23 ENCOUNTER — Ambulatory Visit: Admitting: Vascular Surgery

## 2023-10-23 ENCOUNTER — Ambulatory Visit (HOSPITAL_COMMUNITY): Attending: Vascular Surgery

## 2023-11-13 ENCOUNTER — Ambulatory Visit: Admitting: Gastroenterology

## 2023-12-09 ENCOUNTER — Other Ambulatory Visit (HOSPITAL_COMMUNITY): Payer: Self-pay

## 2023-12-16 ENCOUNTER — Inpatient Hospital Stay: Admitting: Family

## 2023-12-16 ENCOUNTER — Inpatient Hospital Stay: Attending: Medical Oncology

## 2023-12-16 DIAGNOSIS — D509 Iron deficiency anemia, unspecified: Secondary | ICD-10-CM | POA: Insufficient documentation

## 2023-12-16 DIAGNOSIS — R630 Anorexia: Secondary | ICD-10-CM | POA: Insufficient documentation

## 2023-12-16 DIAGNOSIS — M3500 Sicca syndrome, unspecified: Secondary | ICD-10-CM | POA: Insufficient documentation

## 2023-12-16 DIAGNOSIS — Z7901 Long term (current) use of anticoagulants: Secondary | ICD-10-CM | POA: Insufficient documentation

## 2023-12-16 DIAGNOSIS — Z86718 Personal history of other venous thrombosis and embolism: Secondary | ICD-10-CM | POA: Insufficient documentation

## 2023-12-16 DIAGNOSIS — R112 Nausea with vomiting, unspecified: Secondary | ICD-10-CM | POA: Insufficient documentation

## 2023-12-16 DIAGNOSIS — K3184 Gastroparesis: Secondary | ICD-10-CM | POA: Insufficient documentation

## 2023-12-19 ENCOUNTER — Encounter: Payer: Self-pay | Admitting: Vascular Surgery

## 2023-12-20 ENCOUNTER — Ambulatory Visit: Admitting: Family

## 2023-12-20 ENCOUNTER — Inpatient Hospital Stay

## 2023-12-24 ENCOUNTER — Inpatient Hospital Stay: Admitting: Family

## 2023-12-24 ENCOUNTER — Encounter: Payer: Self-pay | Admitting: Family

## 2023-12-24 ENCOUNTER — Inpatient Hospital Stay

## 2023-12-24 VITALS — BP 120/74 | HR 80 | Temp 98.7°F | Resp 18 | Ht 62.0 in | Wt 154.0 lb

## 2023-12-24 DIAGNOSIS — M3500 Sicca syndrome, unspecified: Secondary | ICD-10-CM | POA: Diagnosis not present

## 2023-12-24 DIAGNOSIS — D500 Iron deficiency anemia secondary to blood loss (chronic): Secondary | ICD-10-CM | POA: Diagnosis not present

## 2023-12-24 DIAGNOSIS — R112 Nausea with vomiting, unspecified: Secondary | ICD-10-CM | POA: Diagnosis not present

## 2023-12-24 DIAGNOSIS — Z86718 Personal history of other venous thrombosis and embolism: Secondary | ICD-10-CM | POA: Diagnosis present

## 2023-12-24 DIAGNOSIS — I871 Compression of vein: Secondary | ICD-10-CM | POA: Diagnosis not present

## 2023-12-24 DIAGNOSIS — R630 Anorexia: Secondary | ICD-10-CM | POA: Diagnosis not present

## 2023-12-24 DIAGNOSIS — D509 Iron deficiency anemia, unspecified: Secondary | ICD-10-CM | POA: Diagnosis not present

## 2023-12-24 DIAGNOSIS — D6859 Other primary thrombophilia: Secondary | ICD-10-CM

## 2023-12-24 DIAGNOSIS — K3184 Gastroparesis: Secondary | ICD-10-CM | POA: Diagnosis not present

## 2023-12-24 DIAGNOSIS — Z7901 Long term (current) use of anticoagulants: Secondary | ICD-10-CM | POA: Diagnosis not present

## 2023-12-24 DIAGNOSIS — N939 Abnormal uterine and vaginal bleeding, unspecified: Secondary | ICD-10-CM | POA: Diagnosis not present

## 2023-12-24 LAB — CMP (CANCER CENTER ONLY)
ALT: 64 U/L — ABNORMAL HIGH (ref 0–44)
AST: 24 U/L (ref 15–41)
Albumin: 4.9 g/dL (ref 3.5–5.0)
Alkaline Phosphatase: 126 U/L (ref 38–126)
Anion gap: 11 (ref 5–15)
BUN: 10 mg/dL (ref 6–20)
CO2: 26 mmol/L (ref 22–32)
Calcium: 9.7 mg/dL (ref 8.9–10.3)
Chloride: 103 mmol/L (ref 98–111)
Creatinine: 0.71 mg/dL (ref 0.44–1.00)
GFR, Estimated: >60 mL/min (ref >60)
Glucose, Bld: 98 mg/dL (ref 70–99)
Potassium: 4.5 mmol/L (ref 3.5–5.1)
Sodium: 139 mmol/L (ref 135–145)
Total Bilirubin: <0.2 mg/dL (ref 0.0–1.2)
Total Protein: 7.3 g/dL (ref 6.5–8.1)

## 2023-12-24 LAB — FERRITIN: Ferritin: 171 ng/mL (ref 11–307)

## 2023-12-24 LAB — CBC WITH DIFFERENTIAL (CANCER CENTER ONLY)
Abs Immature Granulocytes: 0.05 K/uL (ref 0.00–0.07)
Basophils Absolute: 0.1 K/uL (ref 0.0–0.1)
Basophils Relative: 1 %
Eosinophils Absolute: 0.2 K/uL (ref 0.0–0.5)
Eosinophils Relative: 2 %
HCT: 40.7 % (ref 36.0–46.0)
Hemoglobin: 13.6 g/dL (ref 12.0–15.0)
Immature Granulocytes: 1 %
Lymphocytes Relative: 22 %
Lymphs Abs: 2.1 K/uL (ref 0.7–4.0)
MCH: 30.5 pg (ref 26.0–34.0)
MCHC: 33.4 g/dL (ref 30.0–36.0)
MCV: 91.3 fL (ref 80.0–100.0)
Monocytes Absolute: 0.7 K/uL (ref 0.1–1.0)
Monocytes Relative: 7 %
Neutro Abs: 6.4 K/uL (ref 1.7–7.7)
Neutrophils Relative %: 67 %
Platelet Count: 373 K/uL (ref 150–400)
RBC: 4.46 MIL/uL (ref 3.87–5.11)
RDW: 12.1 % (ref 11.5–15.5)
WBC Count: 9.4 K/uL (ref 4.0–10.5)
nRBC: 0 % (ref 0.0–0.2)

## 2023-12-24 LAB — IRON AND IRON BINDING CAPACITY (CC-WL,HP ONLY)
Iron: 83 ug/dL (ref 28–170)
Saturation Ratios: 31 % (ref 10.4–31.8)
TIBC: 266 ug/dL (ref 250–450)
UIBC: 184 ug/dL

## 2023-12-24 LAB — RETICULOCYTES
Immature Retic Fract: 2.7 % (ref 2.3–15.9)
RBC.: 4.46 MIL/uL (ref 3.87–5.11)
Retic Count, Absolute: 52.6 K/uL (ref 19.0–186.0)
Retic Ct Pct: 1.2 % (ref 0.4–3.1)

## 2023-12-24 NOTE — Progress Notes (Unsigned)
 Hematology and Oncology Follow Up Visit  Yesenia Harper [id number] 1985/02/08 38 y.o. 12/24/2023   Principle Diagnosis:  May-Thurner Syndrome Recurrent DVT in the left lower extremity Iron deficiency anemia    Current Therapy:        Xarelto  20 mg PO daily Tranfusional support as needed IV iron as indicated    Interim History:  Yesenia Harper is here today with her husband and daughter for follow-up. She is having a rough time with nausea and vomiting with and without eating and loss of appetite secondary to gastroparesis. GI has placed her on multiple antiemetics. She is scheduled for EGD with [facility]. She may have Botox treatment to help relax her Pylorus. She had the Botox treatment in the past and only had improvement for a month.  I have spoke with GI and filld out her clearance form for EGD. She will hold her Xarelto  starting 2 days prior to he procedure and restart the day after as long as no invasive surgery is required. GI will adjust when she should restart is this happens. Patient verbalized understanding.  No fever, chills, cough, rash, chest pain, palpitations or changes in bowel or bladder habits.  No falls or syncope reported. She is fatigued and states that she wants to sleep all the time.  She states that she has virtually no appetite die to the n/v. Hydration is fair. Weight is down to 154 lbs.  Vascular is now following her PRN and has requested we take over her Xarelto  script and surgical clearance which is fine.  She has dry mouth with Sjogren's.   ECOG Performance Status: 1 - Symptomatic but completely ambulatory  Medications:  Allergies as of 12/24/2023       Reactions   Penicillins Itching, Rash   Patient feels itching in mouth.        Medication List        Accurate as of December 24, 2023  2:25 PM. If you have any questions, ask your nurse or doctor.          STOP taking these medications    diltiazem  120 MG 24 hr capsule Commonly known as:  CARDIZEM  CD Stopped by: Yesenia Pepper, NP   linaclotide  290 MCG Caps capsule Commonly known as: LINZESS  Stopped by: Yesenia Pepper, NP   metFORMIN  500 MG 24 hr tablet Commonly known as: GLUCOPHAGE -XR Stopped by: Yesenia Pepper, NP       TAKE these medications    Acetaminophen  Extra Strength 500 MG Tabs Take 2 tablets (1,000 mg total) by mouth every 6 (six) hours.   ALPRAZolam  0.25 MG tablet Commonly known as: XANAX  Take 0.25 mg by mouth 3 (three) times daily as needed for anxiety.   aprepitant 80 MG capsule Commonly known as: EMEND Take 80 mg by mouth 3 (three) times a week.   B-D 3CC LUER-LOK SYR 25GX1/2 25G X 1-1/2 3 ML Misc Generic drug: SYRINGE-NEEDLE (DISP) 3 ML Inject 1 each into the muscle once a week.   Botox 200 units injection Generic drug: botulinum toxin Type A Inject 200 Units into the muscle every 3 (three) months. This is done by neurologist --- treatment for migraines   buPROPion  HCl ER (XL) 450 MG Tb24 Take 450 mg by mouth in the morning.   busPIRone 5 MG tablet Commonly known as: BUSPAR Take 5 mg by mouth 2 (two) times daily.   cyanocobalamin  1000 MCG/ML injection Commonly known as: VITAMIN B12 Inject 1,000 mcg into the muscle every Wednesday.   cyproheptadine  4 MG tablet Commonly known as: PERIACTIN Take 4 mg by mouth at bedtime.   diltiazem  360 MG 24 hr tablet Commonly known as: CARDIZEM  LA Take 360 mg by mouth daily.   folic acid  1 MG tablet Commonly known as: FOLVITE  Take 1 tablet (1 mg total) by mouth daily.   granisetron 3.1 MG/24HR Commonly known as: SANCUSO Place 1 patch onto the skin every 7 (seven) days.   hydroxychloroquine  200 MG tablet Commonly known as: PLAQUENIL  Take 400 mg by mouth daily.   Ibsrela 50 MG Tabs Generic drug: Tenapanor HCl Take 50 mg by mouth.   ibuprofen  600 MG tablet Commonly known as: ADVIL  Take 1 tablet (600 mg total) by mouth every 6 (six) hours.   lamoTRIgine  150 MG tablet Commonly known as:  LAMICTAL  Take 150 mg by mouth at bedtime. What changed: Another medication with the same name was removed. Continue taking this medication, and follow the directions you see here. Changed by: Yesenia Pepper, NP   levothyroxine  88 MCG tablet Commonly known as: SYNTHROID  Take 1 tablet (88 mcg total) by mouth daily.   lurasidone  40 MG Tabs tablet Commonly known as: LATUDA  Take 40 mg by mouth every evening.   meclizine  25 MG tablet Commonly known as: ANTIVERT  Take 25 mg by mouth 3 (three) times daily as needed for dizziness.   MULTIVITAMIN ADULT PO Take 1 tablet by mouth in the morning.   Nurtec 75 MG Tbdp Generic drug: Rimegepant Sulfate Take 75 mg by mouth daily as needed for migraine.   omeprazole 40 MG capsule Commonly known as: PRILOSEC Take 40 mg by mouth every morning. What changed: Another medication with the same name was removed. Continue taking this medication, and follow the directions you see here. Changed by: Yesenia Pepper, NP   ondansetron  8 MG disintegrating tablet Commonly known as: ZOFRAN -ODT TAKE 1 TABLET BY MOUTH EVERY 8 HOURS AS NEEDED FOR NAUSEA   primidone 50 MG tablet Commonly known as: MYSOLINE Take 50 mg by mouth daily.   promethazine  12.5 MG tablet Commonly known as: PHENERGAN  Take 12.5 mg by mouth every 4 (four) hours as needed for nausea or vomiting.   Prucalopride Succinate 2 MG Tabs Take 1 tablet by mouth daily.   Qulipta  60 MG Tabs Generic drug: Atogepant  Take 60 mg by mouth in the morning.   spironolactone  25 MG tablet Commonly known as: ALDACTONE  Take 25 mg by mouth in the morning.   Vilazodone  HCl 40 MG Tabs Commonly known as: Viibryd  Take 1 tablet (40 mg total) by mouth daily.   Vitamin D (Ergocalciferol) 1.25 MG (50000 UNIT) Caps capsule Commonly known as: DRISDOL Take 50,000 Units by mouth every Thursday.   Xarelto  20 MG Tabs tablet Generic drug: rivaroxaban  TAKE 1 TABLET BY MOUTH DAILY WITH SUPPER.   Zavzpret 10 MG/ACT  Soln Generic drug: Zavegepant HCl ADMINISTER 10 MG INTO AFFECTED NOSTRIL(S) AS NEEDED (1 SPRAY 1 NOSTRIL EVERY 24 HOURS AS NEEDED FOR MIGRAINE).   zolpidem  10 MG tablet Commonly known as: AMBIEN  Take 10 mg by mouth at bedtime.        Allergies: Allergies[1]  Past Medical History, Surgical history, Social history, and Family History were reviewed and updated.  Review of Systems: All other 10 point review of systems is negative.   Physical Exam:  vitals were not taken for this visit.   Wt Readings from Last 3 Encounters:  08/19/23 167 lb 12.8 oz (76.1 kg)  06/26/23 167 lb (75.8 kg)  06/26/23 173 lb (78.5  kg)    Ocular: Sclerae unicteric, pupils equal, round and reactive to light Ear-nose-throat: Oropharynx clear, dentition fair Lymphatic: No cervical or supraclavicular adenopathy Lungs no rales or rhonchi, good excursion bilaterally Heart regular rate and rhythm, no murmur appreciated Abd soft, nontender, positive bowel sounds MSK no focal spinal tenderness, no joint edema Neuro: non-focal, well-oriented, appropriate affect Breasts: Deferred   Lab Results  Component Value Date   WBC 9.4 12/24/2023   HGB 13.6 12/24/2023   HCT 40.7 12/24/2023   MCV 91.3 12/24/2023   PLT 373 12/24/2023   Lab Results  Component Value Date   FERRITIN 160 08/19/2023   IRON 73 08/19/2023   TIBC 300 08/19/2023   UIBC 227 08/19/2023   IRONPCTSAT 24 08/19/2023   Lab Results  Component Value Date   RETICCTPCT 1.2 12/24/2023   RBC 4.46 12/24/2023   RBC 4.46 12/24/2023   No results found for: KPAFRELGTCHN, LAMBDASER, KAPLAMBRATIO No results found for: IGGSERUM, IGA, IGMSERUM No results found for: STEPHANY CARLOTA BENSON MARKEL EARLA JOANNIE DOC VICK, SPEI   Chemistry      Component Value Date/Time   NA 140 08/19/2023 1448   NA 138 04/16/2022 0000   K 4.2 08/19/2023 1448   CL 103 08/19/2023 1448   CO2 23 08/19/2023 1448   BUN 12  08/19/2023 1448   BUN 13 04/16/2022 0000   CREATININE 0.90 08/19/2023 1448   CREATININE 0.78 05/12/2021 0000   GLU 102 04/16/2022 0000      Component Value Date/Time   CALCIUM 9.8 08/19/2023 1448   ALKPHOS 94 08/19/2023 1448   AST 22 08/19/2023 1448   ALT 35 08/19/2023 1448   BILITOT 0.3 08/19/2023 1448       Impression and Plan: Ms. Denno is a very pleasant 38 yo caucasian female with history recurrent left lower extremity DVT and [name]. Hyper coag panel was negative. Her most recent DVT was in Dec 2023.  Iron studies are pending. We will replace if needed.  She is doing well on Xarelto  20 mg PO daily.  Follow-up in 2 months.    Yesenia Pepper, NP 12/16/20252:25 PM     [1]  Allergies Allergen Reactions   Penicillins Itching and Rash    Patient feels itching in mouth.

## 2023-12-25 ENCOUNTER — Encounter: Payer: Self-pay | Admitting: Family

## 2024-02-12 ENCOUNTER — Encounter: Payer: Self-pay | Admitting: Medical-Surgical

## 2024-02-18 ENCOUNTER — Ambulatory Visit: Admitting: Medical-Surgical

## 2024-02-21 ENCOUNTER — Ambulatory Visit: Admitting: Medical-Surgical

## 2024-02-28 ENCOUNTER — Inpatient Hospital Stay: Admitting: Family

## 2024-02-28 ENCOUNTER — Inpatient Hospital Stay: Attending: Medical Oncology
